# Patient Record
Sex: Male | Born: 1945
Health system: Southern US, Community
[De-identification: ages and names within clinical notes are randomized; demographics above are authoritative.]

## PROBLEM LIST (undated history)

## (undated) ENCOUNTER — Emergency Department (HOSPITAL_COMMUNITY): Payer: Medicare Other

## (undated) DIAGNOSIS — J189 Pneumonia, unspecified organism: Secondary | ICD-10-CM

## (undated) DIAGNOSIS — N51 Disorders of male genital organs in diseases classified elsewhere: Secondary | ICD-10-CM

## (undated) DIAGNOSIS — C3490 Malignant neoplasm of unspecified part of unspecified bronchus or lung: Secondary | ICD-10-CM

## (undated) DIAGNOSIS — A630 Anogenital (venereal) warts: Secondary | ICD-10-CM

## (undated) DIAGNOSIS — N509 Disorder of male genital organs, unspecified: Secondary | ICD-10-CM

## (undated) DIAGNOSIS — Z923 Personal history of irradiation: Secondary | ICD-10-CM

## (undated) MED FILL — Fosaprepitant Dimeglumine For IV Infusion 150 MG (Base Eq): INTRAVENOUS | Qty: 5 | Status: AC

---

## 2007-04-08 HISTORY — PX: HERNIA REPAIR: SHX51

## 2011-07-16 DIAGNOSIS — S61209A Unspecified open wound of unspecified finger without damage to nail, initial encounter: Secondary | ICD-10-CM | POA: Diagnosis not present

## 2011-07-16 DIAGNOSIS — Z23 Encounter for immunization: Secondary | ICD-10-CM | POA: Diagnosis not present

## 2011-09-17 DIAGNOSIS — F17200 Nicotine dependence, unspecified, uncomplicated: Secondary | ICD-10-CM | POA: Diagnosis not present

## 2011-09-17 DIAGNOSIS — R7309 Other abnormal glucose: Secondary | ICD-10-CM | POA: Diagnosis not present

## 2011-09-17 DIAGNOSIS — E782 Mixed hyperlipidemia: Secondary | ICD-10-CM | POA: Diagnosis not present

## 2011-09-17 DIAGNOSIS — N400 Enlarged prostate without lower urinary tract symptoms: Secondary | ICD-10-CM | POA: Diagnosis not present

## 2011-09-24 DIAGNOSIS — F17200 Nicotine dependence, unspecified, uncomplicated: Secondary | ICD-10-CM | POA: Diagnosis not present

## 2011-09-24 DIAGNOSIS — B079 Viral wart, unspecified: Secondary | ICD-10-CM | POA: Diagnosis not present

## 2011-09-24 DIAGNOSIS — R7309 Other abnormal glucose: Secondary | ICD-10-CM | POA: Diagnosis not present

## 2011-09-24 DIAGNOSIS — E782 Mixed hyperlipidemia: Secondary | ICD-10-CM | POA: Diagnosis not present

## 2011-10-30 DIAGNOSIS — A630 Anogenital (venereal) warts: Secondary | ICD-10-CM | POA: Diagnosis not present

## 2011-10-31 ENCOUNTER — Other Ambulatory Visit: Payer: Self-pay | Admitting: Urology

## 2011-11-11 ENCOUNTER — Encounter (HOSPITAL_BASED_OUTPATIENT_CLINIC_OR_DEPARTMENT_OTHER): Payer: Self-pay | Admitting: *Deleted

## 2011-11-11 NOTE — Progress Notes (Signed)
NPO AFTER MN. ARRIVES AT 0730. NEEDS HG AND EKG.

## 2011-11-18 ENCOUNTER — Ambulatory Visit (HOSPITAL_BASED_OUTPATIENT_CLINIC_OR_DEPARTMENT_OTHER): Admission: RE | Admit: 2011-11-18 | Payer: Medicare Other | Source: Ambulatory Visit | Admitting: Urology

## 2011-11-18 ENCOUNTER — Encounter (HOSPITAL_BASED_OUTPATIENT_CLINIC_OR_DEPARTMENT_OTHER): Admission: RE | Payer: Self-pay | Source: Ambulatory Visit

## 2011-11-18 HISTORY — DX: Anogenital (venereal) warts: A63.0

## 2011-11-18 HISTORY — DX: Disorder of male genital organs, unspecified: N50.9

## 2011-11-18 HISTORY — DX: Disorders of male genital organs in diseases classified elsewhere: N51

## 2011-11-18 SURGERY — REMOVAL, CONDYLOMA
Anesthesia: General

## 2011-11-20 ENCOUNTER — Encounter (HOSPITAL_COMMUNITY): Payer: Self-pay

## 2011-11-20 ENCOUNTER — Encounter (HOSPITAL_COMMUNITY)
Admission: RE | Admit: 2011-11-20 | Discharge: 2011-11-20 | Disposition: A | Discharge status: Home / Self Care | Payer: Medicare Other | Source: Ambulatory Visit | Attending: Urology | Admitting: Urology

## 2011-11-20 DIAGNOSIS — A630 Anogenital (venereal) warts: Secondary | ICD-10-CM | POA: Diagnosis not present

## 2011-11-20 DIAGNOSIS — Z01818 Encounter for other preprocedural examination: Secondary | ICD-10-CM | POA: Diagnosis not present

## 2011-11-20 LAB — CBC
HCT: 48.7 % (ref 39.0–52.0)
Hemoglobin: 17 g/dL (ref 13.0–17.0)
MCH: 32.1 pg (ref 26.0–34.0)
MCHC: 34.9 g/dL (ref 30.0–36.0)
MCV: 92.1 fL (ref 78.0–100.0)
RBC: 5.29 MIL/uL (ref 4.22–5.81)

## 2011-11-20 LAB — SURGICAL PCR SCREEN: MRSA, PCR: NEGATIVE

## 2011-11-20 NOTE — Pre-Procedure Instructions (Signed)
Reviewed with patient pre-op instructions using Teach back method. 

## 2011-11-20 NOTE — Patient Instructions (Signed)
20 Edward Beck  11/20/2011   Your procedure is scheduled on:  Friday 11/21/2011 at 1130 am   Report to [facility] at 0900 AM.  Call this number if you have problems the morning of surgery: [phone]   Remember:   Do not eat food:After Midnight.  May have clear liquids:until Midnight .    Take these medicines the morning of surgery with A SIP OF WATER: none   Do not wear jewelry  Do not wear lotions, powders, colognes  Do not shave 48 hours prior to surgery. Men may shave face and neck.  Do not bring valuables to the hospital.  Contacts, dentures or bridgework may not be worn into surgery.  Leave suitcase in the car. After surgery it may be brought to your room.  For patients admitted to the hospital, checkout time is 11:00 AM the day of discharge.   Patients discharged the day of surgery will not be allowed to drive home.  Name and phone number of your driver: [name]  [phone]  Special Instructions: CHG Shower Use Special Wash: 1/2 bottle night before surgery and 1/2 bottle morning of surgery.   Please read over the following fact sheets that you were given: MRSA Information, Sleep apnea sheet, Incentive spirometry sheet

## 2011-11-21 ENCOUNTER — Encounter (HOSPITAL_COMMUNITY): Payer: Self-pay | Admitting: Certified Registered Nurse Anesthetist

## 2011-11-21 ENCOUNTER — Ambulatory Visit (HOSPITAL_COMMUNITY)
Admission: RE | Admit: 2011-11-21 | Discharge: 2011-11-21 | Disposition: A | Discharge status: Home / Self Care | Payer: Medicare Other | Source: Ambulatory Visit | Attending: Urology | Admitting: Urology

## 2011-11-21 ENCOUNTER — Encounter (HOSPITAL_COMMUNITY)
Admission: RE | Disposition: A | Discharge status: Home / Self Care | Payer: Self-pay | Source: Ambulatory Visit | Attending: Urology

## 2011-11-21 ENCOUNTER — Encounter (HOSPITAL_COMMUNITY): Payer: Self-pay | Admitting: *Deleted

## 2011-11-21 ENCOUNTER — Ambulatory Visit (HOSPITAL_COMMUNITY): Payer: Medicare Other | Admitting: Certified Registered Nurse Anesthetist

## 2011-11-21 DIAGNOSIS — Z01818 Encounter for other preprocedural examination: Secondary | ICD-10-CM | POA: Diagnosis not present

## 2011-11-21 DIAGNOSIS — A630 Anogenital (venereal) warts: Secondary | ICD-10-CM | POA: Diagnosis not present

## 2011-11-21 DIAGNOSIS — N509 Disorder of male genital organs, unspecified: Secondary | ICD-10-CM | POA: Diagnosis not present

## 2011-11-21 HISTORY — PX: SCROTAL EXPLORATION: SHX2386

## 2011-11-21 SURGERY — EXPLORATION, SCROTUM
Anesthesia: General | Wound class: Clean

## 2011-11-21 MED ORDER — ACETAMINOPHEN 10 MG/ML IV SOLN
INTRAVENOUS | Status: AC
  Filled 2011-11-21: qty 100

## 2011-11-21 MED ORDER — BUPIVACAINE HCL (PF) 0.25 % IJ SOLN
INTRAMUSCULAR | Status: DC | PRN
  Administered 2011-11-21: 10 mL

## 2011-11-21 MED ORDER — LACTATED RINGERS IV SOLN
INTRAVENOUS | Status: DC
  Administered 2011-11-21: 1000 mL via INTRAVENOUS

## 2011-11-21 MED ORDER — PROPOFOL 10 MG/ML IV EMUL
INTRAVENOUS | Status: DC | PRN
  Administered 2011-11-21: 200 mg via INTRAVENOUS

## 2011-11-21 MED ORDER — BACITRACIN-NEOMYCIN-POLYMYXIN OINTMENT TUBE
TOPICAL_OINTMENT | CUTANEOUS | Status: DC | PRN
  Administered 2011-11-21: 1 via TOPICAL

## 2011-11-21 MED ORDER — HYDROCODONE-ACETAMINOPHEN 5-500 MG PO TABS: 1.0000 | ORAL_TABLET | Freq: Four times a day (QID) | ORAL | Status: AC | PRN

## 2011-11-21 MED ORDER — CEFAZOLIN SODIUM-DEXTROSE 2-3 GM-% IV SOLR
2.0000 g | INTRAVENOUS | Status: AC
  Administered 2011-11-21: 2 g via INTRAVENOUS

## 2011-11-21 MED ORDER — PROMETHAZINE HCL 25 MG/ML IJ SOLN: 6.2500 mg | INTRAMUSCULAR | Status: DC | PRN

## 2011-11-21 MED ORDER — EPHEDRINE SULFATE 50 MG/ML IJ SOLN
INTRAMUSCULAR | Status: DC | PRN
  Administered 2011-11-21: 5 mg via INTRAVENOUS
  Administered 2011-11-21 (×2): 10 mg via INTRAVENOUS

## 2011-11-21 MED ORDER — 0.9 % SODIUM CHLORIDE (POUR BTL) OPTIME
TOPICAL | Status: DC | PRN
  Administered 2011-11-21: 1000 mL

## 2011-11-21 MED ORDER — ACETAMINOPHEN 10 MG/ML IV SOLN
INTRAVENOUS | Status: DC | PRN
  Administered 2011-11-21: 1000 mg via INTRAVENOUS

## 2011-11-21 MED ORDER — LACTATED RINGERS IV SOLN
INTRAVENOUS | Status: DC | PRN
  Administered 2011-11-21: 13:00:00 via INTRAVENOUS

## 2011-11-21 MED ORDER — ONDANSETRON HCL 4 MG/2ML IJ SOLN
INTRAMUSCULAR | Status: DC | PRN
  Administered 2011-11-21: 4 mg via INTRAVENOUS

## 2011-11-21 MED ORDER — FENTANYL CITRATE 0.05 MG/ML IJ SOLN
INTRAMUSCULAR | Status: DC | PRN
  Administered 2011-11-21: 100 ug via INTRAVENOUS

## 2011-11-21 MED ORDER — FENTANYL CITRATE 0.05 MG/ML IJ SOLN: 25.0000 ug | INTRAMUSCULAR | Status: DC | PRN

## 2011-11-21 MED ORDER — CEPHALEXIN 250 MG PO CAPS: 250.0000 mg | ORAL_CAPSULE | Freq: Four times a day (QID) | ORAL | Status: AC

## 2011-11-21 MED ORDER — BUPIVACAINE HCL (PF) 0.25 % IJ SOLN
INTRAMUSCULAR | Status: AC
  Filled 2011-11-21: qty 30

## 2011-11-21 MED ORDER — LIDOCAINE HCL (CARDIAC) 20 MG/ML IV SOLN
INTRAVENOUS | Status: DC | PRN
  Administered 2011-11-21: 20 mg via INTRAVENOUS

## 2011-11-21 MED ORDER — KETOROLAC TROMETHAMINE 30 MG/ML IJ SOLN: 15.0000 mg | Freq: Once | INTRAMUSCULAR | Status: DC | PRN

## 2011-11-21 MED ORDER — CEFAZOLIN SODIUM-DEXTROSE 2-3 GM-% IV SOLR
INTRAVENOUS | Status: AC
  Filled 2011-11-21: qty 50

## 2011-11-21 SURGICAL SUPPLY — 24 items
BLADE HEX COATED 2.75 (ELECTRODE) ×2 IMPLANT
CLOTH BEACON ORANGE TIMEOUT ST (SAFETY) ×2 IMPLANT
COVER SURGICAL LIGHT HANDLE (MISCELLANEOUS) ×2 IMPLANT
DECANTER SPIKE VIAL GLASS SM (MISCELLANEOUS) IMPLANT
DRAIN PENROSE 18X1/4 LTX STRL (WOUND CARE) IMPLANT
DRSG TEGADERM 4X4.75 (GAUZE/BANDAGES/DRESSINGS) ×2 IMPLANT
ELECT REM PT RETURN 9FT ADLT (ELECTROSURGICAL) ×2
ELECTRODE REM PT RTRN 9FT ADLT (ELECTROSURGICAL) ×1 IMPLANT
GLOVE BIOGEL M 7.0 STRL (GLOVE) ×2 IMPLANT
GOWN STRL NON-REIN LRG LVL3 (GOWN DISPOSABLE) ×4 IMPLANT
KIT BASIN OR (CUSTOM PROCEDURE TRAY) ×2 IMPLANT
NEEDLE HYPO 22GX1.5 SAFETY (NEEDLE) ×2 IMPLANT
NS IRRIG 1000ML POUR BTL (IV SOLUTION) ×2 IMPLANT
PACK GENERAL/GYN (CUSTOM PROCEDURE TRAY) ×2 IMPLANT
SPONGE GAUZE 4X4 12PLY (GAUZE/BANDAGES/DRESSINGS) ×2 IMPLANT
SUPPORT SCROTAL LG STRP (MISCELLANEOUS) ×2 IMPLANT
SUT CHROMIC 3 0 PS 2 (SUTURE) IMPLANT
SUT CHROMIC 3 0 SH 27 (SUTURE) IMPLANT
SUT MNCRL AB 4-0 PS2 18 (SUTURE) ×2 IMPLANT
SUT VIC AB 0 UR5 27 (SUTURE) IMPLANT
SUT VIC AB 3-0 SH 27 (SUTURE) ×1
SUT VIC AB 3-0 SH 27XBRD (SUTURE) ×1 IMPLANT
SYR CONTROL 10ML LL (SYRINGE) ×2 IMPLANT
WATER STERILE IRR 1500ML POUR (IV SOLUTION) IMPLANT

## 2011-11-21 NOTE — Op Note (Signed)
  General  Pre-op diagnosis: Large scrotal condyloma  Postop diagnosis: Same  Procedure done: Excision biopsy of large scrotal condyloma  Surgeon: Wendie Simmer. Graciella Arment  Anesthesia: General  Indication: Patient is a 66 years old male who has had a growth on the scrotum for several years. It has been recently increasing in size. And it is now causing him some discomfort. He would like to have it excised. He is scheduled today for the procedure.  Procedure: The patient was identified by his wrist band and proper timeout was taken.  Under general anesthesia patient was prepped and draped and placed in the supine position. The  right scrotum in the area of the condyloma was infiltrated with 0.25% Marcaine. Then an elliptical incision was made around the edges of the condyloma. The incision was carried down to the subcutaneous tissues. Then the condyloma was sharply excised with the Metzenbaum scissors. Hemostasis was secured with electrocautery. Then the wound was closed in 2 layers as follows: Subcutaneous tissue were approximated with #3-0 Vicryl and the skin was closed with #4-0 Monocryl.  EBL: Minimal  Needles, sponges count:  Correct on 2 counts.  The patient tolerated the procedure well and left the OR in satisfactory condition to postanesthesia care unit.

## 2011-11-21 NOTE — Anesthesia Postprocedure Evaluation (Signed)
  Anesthesia Post-op Note  Patient: Edward Beck  Procedure(s) Performed: Procedure(s) (LRB): SCROTUM EXPLORATION (N/A)  Patient Location: PACU  Anesthesia Type: General  Level of Consciousness: awake and alert   Airway and Oxygen Therapy: Patient Spontanous Breathing  Post-op Pain: mild  Post-op Assessment: Post-op Vital signs reviewed, Patient's Cardiovascular Status Stable, Respiratory Function Stable, Patent Airway and No signs of Nausea or vomiting  Post-op Vital Signs: stable  Complications: No apparent anesthesia complications

## 2011-11-21 NOTE — Transfer of Care (Signed)
Immediate Anesthesia Transfer of Care Note  Patient: Edward Beck  Procedure(s) Performed: Procedure(s) (LRB): SCROTUM EXPLORATION (N/A)  Patient Location: PACU  Anesthesia Type: General  Level of Consciousness: awake and alert   Airway & Oxygen Therapy: Patient Spontanous Breathing and Patient connected to face mask  Post-op Assessment: Report given to PACU RN  Post vital signs: Reviewed and stable  Complications: No apparent anesthesia complications

## 2011-11-21 NOTE — Anesthesia Preprocedure Evaluation (Signed)
Anesthesia Evaluation  Patient identified by MRN, date of birth, ID band Patient awake    Reviewed: Allergy & Precautions, H&P , NPO status , Patient's Chart, lab work & pertinent test results  Airway Mallampati: II TM Distance: <3 FB Neck ROM: Full    Dental No notable dental hx.    Pulmonary COPDCurrent Smoker,  breath sounds clear to auscultation  Pulmonary exam normal       Cardiovascular negative cardio ROS  Rhythm:Regular Rate:Normal     Neuro/Psych negative neurological ROS  negative psych ROS   GI/Hepatic negative GI ROS, Neg liver ROS,   Endo/Other  negative endocrine ROS  Renal/GU negative Renal ROS  negative genitourinary   Musculoskeletal negative musculoskeletal ROS (+)   Abdominal   Peds negative pediatric ROS (+)  Hematology negative hematology ROS (+)   Anesthesia Other Findings   Reproductive/Obstetrics negative OB ROS                           Anesthesia Physical Anesthesia Plan  ASA: II  Anesthesia Plan: General   Post-op Pain Management:    Induction: Intravenous  Airway Management Planned: LMA  Additional Equipment:   Intra-op Plan:   Post-operative Plan:   Informed Consent: I have reviewed the patients History and Physical, chart, labs and discussed the procedure including the risks, benefits and alternatives for the proposed anesthesia with the patient or authorized representative who has indicated his/her understanding and acceptance.   Dental advisory given  Plan Discussed with: CRNA and Surgeon  Anesthesia Plan Comments:         Anesthesia Quick Evaluation

## 2011-11-21 NOTE — H&P (Signed)
History of Present Illness     [name] is a referral from Dr Selinda Flavin.  [name] has had a growth on the right side of his scrotum for several years.  For the past year it has increased in size and now measures 3 x 4 cm.  It causes him some discomfort.  He has been using Neosporin ointment on it but he has not noticed any changes.  He denies any voiding symptoms.  He is in good health.  His PSA is 1.8.   Surgical History Problems  1. History of  Inguinal Hernia Repair Left  Current Meds 1. No Reported Medications  Allergies Medication  1. No Known Drug Allergies  Family History Problems  1. Family history of  Family Health Status Number Of Children 2. Maternal history of  Father Deceased At Age ____ 3. Family history of  Heart Disease V17.49 4. Family history of  Laryngeal Cancer V16.2 5. Maternal history of  Mother Deceased At Age ____  Social History Problems    Alcohol Use   Caffeine Use   Marital History - Currently Married   Retired From Work   Tobacco Use 305.1  Review of Systems Genitourinary, constitutional, skin, eye, otolaryngeal, hematologic/lymphatic, cardiovascular, pulmonary, endocrine, musculoskeletal, gastrointestinal, neurological and psychiatric system(s) were reviewed and pertinent findings if present are noted.    Vitals Vital Signs [Data Includes: Last 1 Day]  25Jul2013 09:48AM  BMI Calculated: 25.67 BSA Calculated: 2.17 Height: 6 ft 2 in Weight: 200 lb  Blood Pressure: 142 / 97 Heart Rate: 80 Respiration: 18  Physical Exam Constitutional: Well nourished and well developed . No acute distress.  ENT:. The ears and nose are normal in appearance.  Neck: The appearance of the neck is normal and no neck mass is present.  Pulmonary: No respiratory distress and normal respiratory rhythm and effort.  Cardiovascular: Heart rate and rhythm are normal . No peripheral edema.  Abdomen: The abdomen is soft and nontender. No masses are palpated.  No CVA tenderness. No hernias are palpable. No hepatosplenomegaly noted.  Rectal: Rectal exam demonstrates normal sphincter tone, no tenderness and no masses. Estimated prostate size is 2+. The prostate has no nodularity, is not indurated and is not tender. The left seminal vesicle is nonpalpable. The right seminal vesicle is nonpalpable. The perineum is normal on inspection.  Genitourinary: Examination of the penis demonstrates no discharge, no masses, no lesions and a normal meatus. The scrotum is with lesions.  There is a 3x 4 cm papular lesion on the right upper scrotum.  It is not fixed to the underlying scrotal tissues. The right epididymis is palpably normal and non-tender. The left epididymis is palpably normal and non-tender. The right spermatic cord is palpably normal. The left spermatic cord is palpably normal. The right testis is palpably normal, non-tender and without masses. The left testis is normal, non-tender and without masses.  Lymphatics: The femoral and inguinal nodes are not enlarged or tender.  Skin: Normal skin turgor, no visible rash and no visible skin lesions.  Neuro/Psych:. Mood and affect are appropriate.    Results/Data Urine [Data Includes: Last 1 Day]   25Jul2013  COLOR YELLOW   APPEARANCE CLEAR   SPECIFIC GRAVITY <1.005   pH 6.0   GLUCOSE NEG mg/dL  BILIRUBIN NEG   KETONE NEG mg/dL  BLOOD NEG   PROTEIN NEG mg/dL  UROBILINOGEN 0.2 mg/dL  NITRITE NEG   LEUKOCYTE ESTERASE TRACE   SQUAMOUS EPITHELIAL/HPF NONE SEEN   WBC  0-2 WBC/hpf  RBC NONE SEEN RBC/hpf  BACTERIA NONE SEEN   CRYSTALS NONE SEEN   CASTS NONE SEEN    Assessment Assessed  1. Condyloma Acuminatum 078.11  Plan Condyloma Acuminatum (078.11)  1. Follow-up Schedule Surgery Office  Follow-up  Done: 25Jul2013 Health Maintenance (V70.0)  2. UA With REFLEX  Done: 25Jul2013 09:40AM   Excision biopsy of scrotal lesion.  Because of the size of the lesion I think it is preferable to do it under  anesthesia.  The risks, benefits of the procedure were explained to the patient.  The risks include but are not limited to hemorrhage, infection, wound dehiscence, hematoma.  He understands and wishes to proceed.   Signatures  CC: Dr Selinda Flavin  Electronically signed by : Su Grand, M.D.; Oct 30 2011 10:17AM

## 2011-11-24 ENCOUNTER — Encounter (HOSPITAL_COMMUNITY): Payer: Self-pay | Admitting: Urology

## 2011-12-02 DIAGNOSIS — A630 Anogenital (venereal) warts: Secondary | ICD-10-CM | POA: Diagnosis not present

## 2012-01-08 DIAGNOSIS — A630 Anogenital (venereal) warts: Secondary | ICD-10-CM | POA: Diagnosis not present

## 2013-07-15 DIAGNOSIS — E782 Mixed hyperlipidemia: Secondary | ICD-10-CM | POA: Diagnosis not present

## 2013-07-15 DIAGNOSIS — R7309 Other abnormal glucose: Secondary | ICD-10-CM | POA: Diagnosis not present

## 2013-07-15 DIAGNOSIS — F17200 Nicotine dependence, unspecified, uncomplicated: Secondary | ICD-10-CM | POA: Diagnosis not present

## 2014-01-31 DIAGNOSIS — L570 Actinic keratosis: Secondary | ICD-10-CM | POA: Diagnosis not present

## 2014-01-31 DIAGNOSIS — D485 Neoplasm of uncertain behavior of skin: Secondary | ICD-10-CM | POA: Diagnosis not present

## 2014-01-31 DIAGNOSIS — L821 Other seborrheic keratosis: Secondary | ICD-10-CM | POA: Diagnosis not present

## 2014-02-15 DIAGNOSIS — L570 Actinic keratosis: Secondary | ICD-10-CM | POA: Diagnosis not present

## 2014-02-15 DIAGNOSIS — D485 Neoplasm of uncertain behavior of skin: Secondary | ICD-10-CM | POA: Diagnosis not present

## 2014-02-15 DIAGNOSIS — D0462 Carcinoma in situ of skin of left upper limb, including shoulder: Secondary | ICD-10-CM | POA: Diagnosis not present

## 2014-02-15 DIAGNOSIS — Z85828 Personal history of other malignant neoplasm of skin: Secondary | ICD-10-CM | POA: Diagnosis not present

## 2014-02-15 DIAGNOSIS — L281 Prurigo nodularis: Secondary | ICD-10-CM | POA: Diagnosis not present

## 2014-02-23 DIAGNOSIS — C44320 Squamous cell carcinoma of skin of unspecified parts of face: Secondary | ICD-10-CM | POA: Diagnosis not present

## 2014-02-23 DIAGNOSIS — L570 Actinic keratosis: Secondary | ICD-10-CM | POA: Diagnosis not present

## 2014-03-09 DIAGNOSIS — L570 Actinic keratosis: Secondary | ICD-10-CM | POA: Diagnosis not present

## 2014-03-09 DIAGNOSIS — C44621 Squamous cell carcinoma of skin of unspecified upper limb, including shoulder: Secondary | ICD-10-CM | POA: Diagnosis not present

## 2014-08-01 DIAGNOSIS — L570 Actinic keratosis: Secondary | ICD-10-CM | POA: Diagnosis not present

## 2014-08-01 DIAGNOSIS — L821 Other seborrheic keratosis: Secondary | ICD-10-CM | POA: Diagnosis not present

## 2014-08-01 DIAGNOSIS — Z85828 Personal history of other malignant neoplasm of skin: Secondary | ICD-10-CM | POA: Diagnosis not present

## 2014-08-01 DIAGNOSIS — L859 Epidermal thickening, unspecified: Secondary | ICD-10-CM | POA: Diagnosis not present

## 2014-08-01 DIAGNOSIS — D485 Neoplasm of uncertain behavior of skin: Secondary | ICD-10-CM | POA: Diagnosis not present

## 2015-01-30 DIAGNOSIS — L570 Actinic keratosis: Secondary | ICD-10-CM | POA: Diagnosis not present

## 2015-05-02 DIAGNOSIS — J019 Acute sinusitis, unspecified: Secondary | ICD-10-CM | POA: Diagnosis not present

## 2015-06-08 DIAGNOSIS — Z720 Tobacco use: Secondary | ICD-10-CM | POA: Diagnosis not present

## 2015-06-08 DIAGNOSIS — R7301 Impaired fasting glucose: Secondary | ICD-10-CM | POA: Diagnosis not present

## 2015-06-08 DIAGNOSIS — E782 Mixed hyperlipidemia: Secondary | ICD-10-CM | POA: Diagnosis not present

## 2015-06-12 DIAGNOSIS — E782 Mixed hyperlipidemia: Secondary | ICD-10-CM | POA: Diagnosis not present

## 2015-06-12 DIAGNOSIS — R7301 Impaired fasting glucose: Secondary | ICD-10-CM | POA: Diagnosis not present

## 2015-06-12 DIAGNOSIS — Z720 Tobacco use: Secondary | ICD-10-CM | POA: Diagnosis not present

## 2015-07-24 DIAGNOSIS — L280 Lichen simplex chronicus: Secondary | ICD-10-CM | POA: Diagnosis not present

## 2015-07-24 DIAGNOSIS — Z85828 Personal history of other malignant neoplasm of skin: Secondary | ICD-10-CM | POA: Diagnosis not present

## 2015-07-24 DIAGNOSIS — L570 Actinic keratosis: Secondary | ICD-10-CM | POA: Diagnosis not present

## 2015-10-02 DIAGNOSIS — J019 Acute sinusitis, unspecified: Secondary | ICD-10-CM | POA: Diagnosis not present

## 2015-10-02 DIAGNOSIS — J209 Acute bronchitis, unspecified: Secondary | ICD-10-CM | POA: Diagnosis not present

## 2015-11-24 DIAGNOSIS — N390 Urinary tract infection, site not specified: Secondary | ICD-10-CM | POA: Diagnosis not present

## 2015-11-24 DIAGNOSIS — K5900 Constipation, unspecified: Secondary | ICD-10-CM | POA: Diagnosis not present

## 2015-11-24 DIAGNOSIS — R109 Unspecified abdominal pain: Secondary | ICD-10-CM | POA: Diagnosis not present

## 2015-12-11 DIAGNOSIS — E782 Mixed hyperlipidemia: Secondary | ICD-10-CM | POA: Diagnosis not present

## 2015-12-11 DIAGNOSIS — Z720 Tobacco use: Secondary | ICD-10-CM | POA: Diagnosis not present

## 2015-12-11 DIAGNOSIS — R7301 Impaired fasting glucose: Secondary | ICD-10-CM | POA: Diagnosis not present

## 2015-12-14 DIAGNOSIS — Z6826 Body mass index (BMI) 26.0-26.9, adult: Secondary | ICD-10-CM | POA: Diagnosis not present

## 2015-12-14 DIAGNOSIS — E782 Mixed hyperlipidemia: Secondary | ICD-10-CM | POA: Diagnosis not present

## 2015-12-14 DIAGNOSIS — Z720 Tobacco use: Secondary | ICD-10-CM | POA: Diagnosis not present

## 2015-12-14 DIAGNOSIS — R7301 Impaired fasting glucose: Secondary | ICD-10-CM | POA: Diagnosis not present

## 2016-02-05 DIAGNOSIS — L570 Actinic keratosis: Secondary | ICD-10-CM | POA: Diagnosis not present

## 2016-02-05 DIAGNOSIS — L821 Other seborrheic keratosis: Secondary | ICD-10-CM | POA: Diagnosis not present

## 2016-02-05 DIAGNOSIS — Z85828 Personal history of other malignant neoplasm of skin: Secondary | ICD-10-CM | POA: Diagnosis not present

## 2016-03-04 DIAGNOSIS — Z6826 Body mass index (BMI) 26.0-26.9, adult: Secondary | ICD-10-CM | POA: Diagnosis not present

## 2016-03-04 DIAGNOSIS — Z23 Encounter for immunization: Secondary | ICD-10-CM | POA: Diagnosis not present

## 2016-03-04 DIAGNOSIS — J00 Acute nasopharyngitis [common cold]: Secondary | ICD-10-CM | POA: Diagnosis not present

## 2016-04-02 DIAGNOSIS — Z6825 Body mass index (BMI) 25.0-25.9, adult: Secondary | ICD-10-CM | POA: Diagnosis not present

## 2016-04-02 DIAGNOSIS — J019 Acute sinusitis, unspecified: Secondary | ICD-10-CM | POA: Diagnosis not present

## 2016-04-02 DIAGNOSIS — J209 Acute bronchitis, unspecified: Secondary | ICD-10-CM | POA: Diagnosis not present

## 2016-06-10 DIAGNOSIS — R7301 Impaired fasting glucose: Secondary | ICD-10-CM | POA: Diagnosis not present

## 2016-06-10 DIAGNOSIS — E782 Mixed hyperlipidemia: Secondary | ICD-10-CM | POA: Diagnosis not present

## 2016-06-13 DIAGNOSIS — E782 Mixed hyperlipidemia: Secondary | ICD-10-CM | POA: Diagnosis not present

## 2016-06-13 DIAGNOSIS — Z1389 Encounter for screening for other disorder: Secondary | ICD-10-CM | POA: Diagnosis not present

## 2016-06-13 DIAGNOSIS — R030 Elevated blood-pressure reading, without diagnosis of hypertension: Secondary | ICD-10-CM | POA: Diagnosis not present

## 2016-06-13 DIAGNOSIS — Z720 Tobacco use: Secondary | ICD-10-CM | POA: Diagnosis not present

## 2016-06-13 DIAGNOSIS — Z6825 Body mass index (BMI) 25.0-25.9, adult: Secondary | ICD-10-CM | POA: Diagnosis not present

## 2016-07-30 DIAGNOSIS — L570 Actinic keratosis: Secondary | ICD-10-CM | POA: Diagnosis not present

## 2016-12-09 DIAGNOSIS — Z720 Tobacco use: Secondary | ICD-10-CM | POA: Diagnosis not present

## 2016-12-09 DIAGNOSIS — R7301 Impaired fasting glucose: Secondary | ICD-10-CM | POA: Diagnosis not present

## 2016-12-09 DIAGNOSIS — E782 Mixed hyperlipidemia: Secondary | ICD-10-CM | POA: Diagnosis not present

## 2016-12-09 DIAGNOSIS — R030 Elevated blood-pressure reading, without diagnosis of hypertension: Secondary | ICD-10-CM | POA: Diagnosis not present

## 2016-12-12 DIAGNOSIS — R300 Dysuria: Secondary | ICD-10-CM | POA: Diagnosis not present

## 2016-12-12 DIAGNOSIS — N410 Acute prostatitis: Secondary | ICD-10-CM | POA: Diagnosis not present

## 2016-12-12 DIAGNOSIS — E782 Mixed hyperlipidemia: Secondary | ICD-10-CM | POA: Diagnosis not present

## 2016-12-12 DIAGNOSIS — Z720 Tobacco use: Secondary | ICD-10-CM | POA: Diagnosis not present

## 2016-12-12 DIAGNOSIS — Z6826 Body mass index (BMI) 26.0-26.9, adult: Secondary | ICD-10-CM | POA: Diagnosis not present

## 2017-01-24 DIAGNOSIS — J209 Acute bronchitis, unspecified: Secondary | ICD-10-CM | POA: Diagnosis not present

## 2017-01-27 DIAGNOSIS — Z85828 Personal history of other malignant neoplasm of skin: Secondary | ICD-10-CM | POA: Diagnosis not present

## 2017-01-27 DIAGNOSIS — L570 Actinic keratosis: Secondary | ICD-10-CM | POA: Diagnosis not present

## 2017-01-27 DIAGNOSIS — D485 Neoplasm of uncertain behavior of skin: Secondary | ICD-10-CM | POA: Diagnosis not present

## 2017-02-04 DIAGNOSIS — Z6826 Body mass index (BMI) 26.0-26.9, adult: Secondary | ICD-10-CM | POA: Diagnosis not present

## 2017-02-04 DIAGNOSIS — J019 Acute sinusitis, unspecified: Secondary | ICD-10-CM | POA: Diagnosis not present

## 2017-03-04 DIAGNOSIS — J069 Acute upper respiratory infection, unspecified: Secondary | ICD-10-CM | POA: Diagnosis not present

## 2017-03-04 DIAGNOSIS — N410 Acute prostatitis: Secondary | ICD-10-CM | POA: Diagnosis not present

## 2017-03-04 DIAGNOSIS — Z6826 Body mass index (BMI) 26.0-26.9, adult: Secondary | ICD-10-CM | POA: Diagnosis not present

## 2017-03-17 DIAGNOSIS — Z6826 Body mass index (BMI) 26.0-26.9, adult: Secondary | ICD-10-CM | POA: Diagnosis not present

## 2017-03-17 DIAGNOSIS — N410 Acute prostatitis: Secondary | ICD-10-CM | POA: Diagnosis not present

## 2017-03-17 DIAGNOSIS — R319 Hematuria, unspecified: Secondary | ICD-10-CM | POA: Diagnosis not present

## 2017-05-13 ENCOUNTER — Ambulatory Visit: Payer: Medicare Other | Admitting: Urology

## 2017-05-13 DIAGNOSIS — R310 Gross hematuria: Secondary | ICD-10-CM | POA: Diagnosis not present

## 2017-05-14 ENCOUNTER — Other Ambulatory Visit: Payer: Self-pay | Admitting: Urology

## 2017-05-15 ENCOUNTER — Other Ambulatory Visit: Payer: Self-pay | Admitting: Urology

## 2017-05-15 DIAGNOSIS — R310 Gross hematuria: Secondary | ICD-10-CM

## 2017-06-05 ENCOUNTER — Ambulatory Visit (HOSPITAL_COMMUNITY)
Admission: RE | Admit: 2017-06-05 | Discharge: 2017-06-05 | Disposition: A | Discharge status: Home / Self Care | Payer: Medicare Other | Source: Ambulatory Visit | Attending: Urology | Admitting: Urology

## 2017-06-05 DIAGNOSIS — I700 Atherosclerosis of aorta: Secondary | ICD-10-CM | POA: Insufficient documentation

## 2017-06-05 DIAGNOSIS — N200 Calculus of kidney: Secondary | ICD-10-CM | POA: Diagnosis not present

## 2017-06-05 DIAGNOSIS — N400 Enlarged prostate without lower urinary tract symptoms: Secondary | ICD-10-CM | POA: Insufficient documentation

## 2017-06-05 DIAGNOSIS — I517 Cardiomegaly: Secondary | ICD-10-CM | POA: Insufficient documentation

## 2017-06-05 DIAGNOSIS — R911 Solitary pulmonary nodule: Secondary | ICD-10-CM | POA: Insufficient documentation

## 2017-06-05 DIAGNOSIS — K5732 Diverticulitis of large intestine without perforation or abscess without bleeding: Secondary | ICD-10-CM | POA: Diagnosis not present

## 2017-06-05 DIAGNOSIS — R310 Gross hematuria: Secondary | ICD-10-CM | POA: Insufficient documentation

## 2017-06-05 LAB — POCT I-STAT CREATININE: Creatinine, Ser: 1.1 mg/dL (ref 0.61–1.24)

## 2017-06-05 MED ORDER — IOPAMIDOL (ISOVUE-300) INJECTION 61%
150.0000 mL | Freq: Once | INTRAVENOUS | Status: AC | PRN
  Administered 2017-06-05: 150 mL via INTRAVENOUS

## 2017-06-05 MED ORDER — IOPAMIDOL (ISOVUE-300) INJECTION 61%: 125.0000 mL | Freq: Once | INTRAVENOUS | Status: DC | PRN

## 2017-06-24 ENCOUNTER — Ambulatory Visit (INDEPENDENT_AMBULATORY_CARE_PROVIDER_SITE_OTHER): Payer: Medicare Other | Admitting: Urology

## 2017-06-24 DIAGNOSIS — R310 Gross hematuria: Secondary | ICD-10-CM

## 2017-08-04 DIAGNOSIS — D485 Neoplasm of uncertain behavior of skin: Secondary | ICD-10-CM | POA: Diagnosis not present

## 2017-08-04 DIAGNOSIS — Z85828 Personal history of other malignant neoplasm of skin: Secondary | ICD-10-CM | POA: Diagnosis not present

## 2017-08-04 DIAGNOSIS — L820 Inflamed seborrheic keratosis: Secondary | ICD-10-CM | POA: Diagnosis not present

## 2017-08-04 DIAGNOSIS — L570 Actinic keratosis: Secondary | ICD-10-CM | POA: Diagnosis not present

## 2017-11-06 ENCOUNTER — Other Ambulatory Visit: Payer: Self-pay

## 2017-12-02 DIAGNOSIS — Z6826 Body mass index (BMI) 26.0-26.9, adult: Secondary | ICD-10-CM | POA: Diagnosis not present

## 2017-12-02 DIAGNOSIS — Z0000 Encounter for general adult medical examination without abnormal findings: Secondary | ICD-10-CM | POA: Diagnosis not present

## 2017-12-02 DIAGNOSIS — Z720 Tobacco use: Secondary | ICD-10-CM | POA: Diagnosis not present

## 2017-12-02 DIAGNOSIS — N401 Enlarged prostate with lower urinary tract symptoms: Secondary | ICD-10-CM | POA: Diagnosis not present

## 2017-12-02 DIAGNOSIS — E782 Mixed hyperlipidemia: Secondary | ICD-10-CM | POA: Diagnosis not present

## 2017-12-23 ENCOUNTER — Ambulatory Visit (INDEPENDENT_AMBULATORY_CARE_PROVIDER_SITE_OTHER): Payer: Medicare Other | Admitting: Urology

## 2017-12-23 DIAGNOSIS — N401 Enlarged prostate with lower urinary tract symptoms: Secondary | ICD-10-CM | POA: Diagnosis not present

## 2017-12-23 DIAGNOSIS — R310 Gross hematuria: Secondary | ICD-10-CM | POA: Diagnosis not present

## 2018-02-06 DIAGNOSIS — J019 Acute sinusitis, unspecified: Secondary | ICD-10-CM | POA: Diagnosis not present

## 2018-02-06 DIAGNOSIS — Z6826 Body mass index (BMI) 26.0-26.9, adult: Secondary | ICD-10-CM | POA: Diagnosis not present

## 2018-02-08 DIAGNOSIS — L309 Dermatitis, unspecified: Secondary | ICD-10-CM | POA: Diagnosis not present

## 2018-02-08 DIAGNOSIS — Z85828 Personal history of other malignant neoplasm of skin: Secondary | ICD-10-CM | POA: Diagnosis not present

## 2018-02-08 DIAGNOSIS — L821 Other seborrheic keratosis: Secondary | ICD-10-CM | POA: Diagnosis not present

## 2018-02-08 DIAGNOSIS — D0439 Carcinoma in situ of skin of other parts of face: Secondary | ICD-10-CM | POA: Diagnosis not present

## 2018-02-08 DIAGNOSIS — L570 Actinic keratosis: Secondary | ICD-10-CM | POA: Diagnosis not present

## 2018-02-08 DIAGNOSIS — D485 Neoplasm of uncertain behavior of skin: Secondary | ICD-10-CM | POA: Diagnosis not present

## 2018-02-08 DIAGNOSIS — L280 Lichen simplex chronicus: Secondary | ICD-10-CM | POA: Diagnosis not present

## 2018-02-25 DIAGNOSIS — C44321 Squamous cell carcinoma of skin of nose: Secondary | ICD-10-CM | POA: Diagnosis not present

## 2018-03-01 DIAGNOSIS — Z029 Encounter for administrative examinations, unspecified: Secondary | ICD-10-CM | POA: Diagnosis not present

## 2018-06-14 DIAGNOSIS — J019 Acute sinusitis, unspecified: Secondary | ICD-10-CM | POA: Diagnosis not present

## 2018-06-14 DIAGNOSIS — Z6825 Body mass index (BMI) 25.0-25.9, adult: Secondary | ICD-10-CM | POA: Diagnosis not present

## 2018-08-09 DIAGNOSIS — C44622 Squamous cell carcinoma of skin of right upper limb, including shoulder: Secondary | ICD-10-CM | POA: Diagnosis not present

## 2018-08-09 DIAGNOSIS — D485 Neoplasm of uncertain behavior of skin: Secondary | ICD-10-CM | POA: Diagnosis not present

## 2018-08-09 DIAGNOSIS — Z85828 Personal history of other malignant neoplasm of skin: Secondary | ICD-10-CM | POA: Diagnosis not present

## 2018-08-09 DIAGNOSIS — L570 Actinic keratosis: Secondary | ICD-10-CM | POA: Diagnosis not present

## 2018-08-09 DIAGNOSIS — L309 Dermatitis, unspecified: Secondary | ICD-10-CM | POA: Diagnosis not present

## 2018-08-17 DIAGNOSIS — C44622 Squamous cell carcinoma of skin of right upper limb, including shoulder: Secondary | ICD-10-CM | POA: Diagnosis not present

## 2019-02-08 DIAGNOSIS — L570 Actinic keratosis: Secondary | ICD-10-CM | POA: Diagnosis not present

## 2019-02-08 DIAGNOSIS — Z85828 Personal history of other malignant neoplasm of skin: Secondary | ICD-10-CM | POA: Diagnosis not present

## 2019-02-08 DIAGNOSIS — L821 Other seborrheic keratosis: Secondary | ICD-10-CM | POA: Diagnosis not present

## 2019-03-09 ENCOUNTER — Ambulatory Visit (INDEPENDENT_AMBULATORY_CARE_PROVIDER_SITE_OTHER): Payer: Medicare Other | Admitting: Urology

## 2019-03-09 DIAGNOSIS — N401 Enlarged prostate with lower urinary tract symptoms: Secondary | ICD-10-CM | POA: Diagnosis not present

## 2019-03-09 DIAGNOSIS — R310 Gross hematuria: Secondary | ICD-10-CM

## 2019-03-09 DIAGNOSIS — R3912 Poor urinary stream: Secondary | ICD-10-CM

## 2019-04-08 DIAGNOSIS — U071 COVID-19: Secondary | ICD-10-CM

## 2019-04-08 HISTORY — DX: COVID-19: U07.1

## 2019-05-05 DIAGNOSIS — W312XXA Contact with powered woodworking and forming machines, initial encounter: Secondary | ICD-10-CM | POA: Diagnosis not present

## 2019-05-05 DIAGNOSIS — W268XXA Contact with other sharp object(s), not elsewhere classified, initial encounter: Secondary | ICD-10-CM | POA: Diagnosis not present

## 2019-05-05 DIAGNOSIS — S68110A Complete traumatic metacarpophalangeal amputation of right index finger, initial encounter: Secondary | ICD-10-CM | POA: Diagnosis not present

## 2019-05-05 DIAGNOSIS — S68119A Complete traumatic metacarpophalangeal amputation of unspecified finger, initial encounter: Secondary | ICD-10-CM | POA: Diagnosis not present

## 2019-05-05 DIAGNOSIS — S68120A Partial traumatic metacarpophalangeal amputation of right index finger, initial encounter: Secondary | ICD-10-CM | POA: Diagnosis not present

## 2019-08-08 DIAGNOSIS — L570 Actinic keratosis: Secondary | ICD-10-CM | POA: Diagnosis not present

## 2019-08-08 DIAGNOSIS — D485 Neoplasm of uncertain behavior of skin: Secondary | ICD-10-CM | POA: Diagnosis not present

## 2019-12-02 DIAGNOSIS — R5383 Other fatigue: Secondary | ICD-10-CM | POA: Diagnosis not present

## 2019-12-02 DIAGNOSIS — J069 Acute upper respiratory infection, unspecified: Secondary | ICD-10-CM | POA: Diagnosis not present

## 2019-12-02 DIAGNOSIS — Z20822 Contact with and (suspected) exposure to covid-19: Secondary | ICD-10-CM | POA: Diagnosis not present

## 2019-12-10 DIAGNOSIS — R05 Cough: Secondary | ICD-10-CM | POA: Diagnosis not present

## 2019-12-10 DIAGNOSIS — Z20828 Contact with and (suspected) exposure to other viral communicable diseases: Secondary | ICD-10-CM | POA: Diagnosis not present

## 2020-02-01 DIAGNOSIS — Z23 Encounter for immunization: Secondary | ICD-10-CM | POA: Diagnosis not present

## 2020-02-07 DIAGNOSIS — L570 Actinic keratosis: Secondary | ICD-10-CM | POA: Diagnosis not present

## 2020-02-29 DIAGNOSIS — Z23 Encounter for immunization: Secondary | ICD-10-CM | POA: Diagnosis not present

## 2020-03-07 ENCOUNTER — Ambulatory Visit (INDEPENDENT_AMBULATORY_CARE_PROVIDER_SITE_OTHER): Payer: Medicare Other | Admitting: Urology

## 2020-03-07 ENCOUNTER — Encounter: Payer: Self-pay | Admitting: Urology

## 2020-03-07 ENCOUNTER — Other Ambulatory Visit: Payer: Self-pay

## 2020-03-07 VITALS — BP 134/78 | HR 75 | Temp 98.1°F | Ht 74.0 in | Wt 185.0 lb

## 2020-03-07 DIAGNOSIS — R310 Gross hematuria: Secondary | ICD-10-CM

## 2020-03-07 DIAGNOSIS — R351 Nocturia: Secondary | ICD-10-CM

## 2020-03-07 DIAGNOSIS — N401 Enlarged prostate with lower urinary tract symptoms: Secondary | ICD-10-CM | POA: Diagnosis not present

## 2020-03-07 MED ORDER — FINASTERIDE 5 MG PO TABS: 5.0000 mg | ORAL_TABLET | Freq: Every day | ORAL | 3 refills | Status: DC

## 2020-03-07 NOTE — Progress Notes (Signed)

## 2020-03-07 NOTE — Progress Notes (Signed)
03/07/2020 4:28 PM   Edward Beck 14-Sep-1945 [id number]  Referring provider: [name], PA-C [place],  [place] [address]  Followup gross hematuria  HPI: Edward Beck is a 74yo here for followup for BPH and gross hematuria. No gross hematuria since starting finasteride. He has mild LUTS on finasteride. Nocturia 0-1x. Stream strong. NO other complaints. NO recent PSA.    PMH: Past Medical History:  Diagnosis Date  . Condyloma acuminatum of scrotum   . Scrotal lesion     Surgical History: Past Surgical History:  Procedure Laterality Date  . HERNIA REPAIR  2009   left inguinal  . SCROTAL EXPLORATION  11/21/2011   Procedure: SCROTUM EXPLORATION;  Surgeon: Edward Ben, MD;  Location: [facility];  Service: Urology;  Laterality: N/A;  EXCISION, BIOPSY SCROTAL CONDYLOMA     Home Medications:  Allergies as of 03/07/2020      Reactions   Tamiflu [oseltamivir Phosphate] Rash      Medication List       Accurate as of March 07, 2020  4:28 PM. If you have any questions, ask your nurse or doctor.        finasteride 5 MG tablet Commonly known as: PROSCAR Take 5 mg by mouth daily.       Allergies:  Allergies  Allergen Reactions  . Tamiflu [Oseltamivir Phosphate] Rash    Family History: History reviewed. No pertinent family history.  Social History:  reports that he has been smoking cigarettes. He has a 50.00 pack-year smoking history. He does not have any smokeless tobacco history on file. He reports current alcohol use of about 1.0 standard drink of alcohol per week. No history on file for drug use.  ROS: All other review of systems were reviewed and are negative except what is noted above in HPI  Physical Exam: BP 134/78   Pulse 75   Temp 98.1 F (36.7 C)   Ht 6\' 2"  (1.88 m)   Wt 185 lb (83.9 kg)   BMI 23.75 kg/m   Constitutional:  Alert and oriented, No acute distress. HEENT: [place] AT, moist mucus membranes.  Trachea midline, no  masses. Cardiovascular: No clubbing, cyanosis, or edema. Respiratory: Normal respiratory effort, no increased work of breathing. GI: Abdomen is soft, nontender, nondistended, no abdominal masses GU: No CVA tenderness.  Lymph: No cervical or inguinal lymphadenopathy. Skin: No rashes, bruises or suspicious lesions. Neurologic: Grossly intact, no focal deficits, moving all 4 extremities. Psychiatric: Normal mood and affect.  Laboratory Data: Lab Results  Component Value Date   WBC 6.6 11/20/2011   HGB 17.0 11/20/2011   HCT 48.7 11/20/2011   MCV 92.1 11/20/2011   PLT 261 11/20/2011    Lab Results  Component Value Date   CREATININE 1.10 06/05/2017    No results found for: PSA  No results found for: TESTOSTERONE  No results found for: HGBA1C  Urinalysis No results found for: COLORURINE, APPEARANCEUR, LABSPEC, PHURINE, GLUCOSEU, HGBUR, BILIRUBINUR, KETONESUR, PROTEINUR, UROBILINOGEN, NITRITE, LEUKOCYTESUR  No results found for: LABMICR, [place], RBCUA, LABEPIT, MUCUS, BACTERIA  Pertinent Imaging:  No results found for this or any previous visit.  No results found for this or any previous visit.  No results found for this or any previous visit.  No results found for this or any previous visit.  No results found for this or any previous visit.  No results found for this or any previous visit.  No results found for this or any previous visit.  No  results found for this or any previous visit.   Assessment & Plan:    1. Benign localized prostatic hyperplasia with lower urinary tract symptoms (LUTS) -continue finasteride 5mg  - Urinalysis, Routine w reflex microscopic  2. Gross hematuria -continue finasteride 5mg   3. Nocturia -continue finasteride 5mg     Return in about 1 year (around 03/07/2021).  Edward Bang, MD  [facility] [place]

## 2020-03-07 NOTE — Patient Instructions (Signed)

## 2020-03-08 LAB — URINALYSIS, ROUTINE W REFLEX MICROSCOPIC
Bilirubin, UA: NEGATIVE
Glucose, UA: NEGATIVE
Ketones, UA: NEGATIVE
Nitrite, UA: NEGATIVE
Protein,UA: NEGATIVE
RBC, UA: NEGATIVE
Specific Gravity, UA: 1.025 (ref 1.005–1.030)
Urobilinogen, Ur: 0.2 mg/dL (ref 0.2–1.0)
pH, UA: 5.5 (ref 5.0–7.5)

## 2020-03-08 LAB — MICROSCOPIC EXAMINATION
Bacteria, UA: NONE SEEN
Casts: NONE SEEN /lpf
Epithelial Cells (non renal): NONE SEEN /hpf (ref 0–10)
RBC: NONE SEEN /hpf (ref 0–2)
Renal Epithel, UA: NONE SEEN /hpf

## 2020-03-08 LAB — PSA: Prostate Specific Ag, Serum: 1.2 ng/mL (ref 0.0–4.0)

## 2020-03-12 ENCOUNTER — Telehealth: Payer: Self-pay

## 2020-03-12 NOTE — Telephone Encounter (Signed)
-----   Message from Cleon Gustin, MD sent at 03/12/2020 10:18 AM EST ----- PSA normal ----- Message ----- From: Valentina Lucks, LPN Sent: 13/05/4399   8:38 AM EST To: Cleon Gustin, MD  Pls review

## 2020-03-12 NOTE — Telephone Encounter (Signed)
Pts wife made aware testosterone for pt normal.

## 2020-04-03 DIAGNOSIS — Z6826 Body mass index (BMI) 26.0-26.9, adult: Secondary | ICD-10-CM | POA: Diagnosis not present

## 2020-04-03 DIAGNOSIS — J019 Acute sinusitis, unspecified: Secondary | ICD-10-CM | POA: Diagnosis not present

## 2020-08-08 DIAGNOSIS — L570 Actinic keratosis: Secondary | ICD-10-CM | POA: Diagnosis not present

## 2020-08-08 DIAGNOSIS — Z1283 Encounter for screening for malignant neoplasm of skin: Secondary | ICD-10-CM | POA: Diagnosis not present

## 2020-08-08 DIAGNOSIS — Z85828 Personal history of other malignant neoplasm of skin: Secondary | ICD-10-CM | POA: Diagnosis not present

## 2021-01-24 DIAGNOSIS — Z6826 Body mass index (BMI) 26.0-26.9, adult: Secondary | ICD-10-CM | POA: Diagnosis not present

## 2021-01-24 DIAGNOSIS — F17210 Nicotine dependence, cigarettes, uncomplicated: Secondary | ICD-10-CM | POA: Diagnosis not present

## 2021-01-24 DIAGNOSIS — J019 Acute sinusitis, unspecified: Secondary | ICD-10-CM | POA: Diagnosis not present

## 2021-02-11 DIAGNOSIS — Z1283 Encounter for screening for malignant neoplasm of skin: Secondary | ICD-10-CM | POA: Diagnosis not present

## 2021-02-11 DIAGNOSIS — L570 Actinic keratosis: Secondary | ICD-10-CM | POA: Diagnosis not present

## 2021-02-11 DIAGNOSIS — Z85828 Personal history of other malignant neoplasm of skin: Secondary | ICD-10-CM | POA: Diagnosis not present

## 2021-03-08 ENCOUNTER — Other Ambulatory Visit: Payer: Self-pay

## 2021-03-08 ENCOUNTER — Ambulatory Visit (INDEPENDENT_AMBULATORY_CARE_PROVIDER_SITE_OTHER): Payer: Medicare Other | Admitting: Urology

## 2021-03-08 ENCOUNTER — Encounter: Payer: Self-pay | Admitting: Urology

## 2021-03-08 VITALS — BP 155/78 | HR 97

## 2021-03-08 DIAGNOSIS — R310 Gross hematuria: Secondary | ICD-10-CM

## 2021-03-08 DIAGNOSIS — N401 Enlarged prostate with lower urinary tract symptoms: Secondary | ICD-10-CM | POA: Diagnosis not present

## 2021-03-08 DIAGNOSIS — R351 Nocturia: Secondary | ICD-10-CM

## 2021-03-08 LAB — URINALYSIS, ROUTINE W REFLEX MICROSCOPIC
Bilirubin, UA: NEGATIVE
Glucose, UA: NEGATIVE
Ketones, UA: NEGATIVE
Leukocytes,UA: NEGATIVE
Nitrite, UA: NEGATIVE
Protein,UA: NEGATIVE
Specific Gravity, UA: 1.015 (ref 1.005–1.030)
Urobilinogen, Ur: 0.2 mg/dL (ref 0.2–1.0)
pH, UA: 6.5 (ref 5.0–7.5)

## 2021-03-08 LAB — MICROSCOPIC EXAMINATION
Bacteria, UA: NONE SEEN
Epithelial Cells (non renal): NONE SEEN /hpf (ref 0–10)
RBC: NONE SEEN /hpf (ref 0–2)
Renal Epithel, UA: NONE SEEN /hpf
WBC, UA: NONE SEEN /hpf (ref 0–5)

## 2021-03-08 MED ORDER — FINASTERIDE 5 MG PO TABS: 5.0000 mg | ORAL_TABLET | Freq: Every day | ORAL | 3 refills | Status: DC

## 2021-03-08 NOTE — Progress Notes (Signed)
03/08/2021 12:17 PM   Edward Beck 1945-06-25 [id number]  Referring provider: [name], PA-C [place],  [place] [address]  Followup BPH and Gross hematuria   HPI: Edward Beck is a 75yo here for followup for BPH and gross hematuria. IPSS 4 QOL 0. He is on finasteride 5mg  daily.  Nocturia 1x.  He ran out of finasteride 2 weeks ago and noted his LUTS have worsened. Nocturia worsened to 4-5x. No hematuria or dysuria. No other complaints today   PMH: Past Medical History:  Diagnosis Date   Condyloma acuminatum of scrotum    Scrotal lesion     Surgical History: Past Surgical History:  Procedure Laterality Date   HERNIA REPAIR  2009   left inguinal   SCROTAL EXPLORATION  11/21/2011   Procedure: SCROTUM EXPLORATION;  Surgeon: Hanley Ben, MD;  Location: [facility];  Service: Urology;  Laterality: N/A;  EXCISION, BIOPSY SCROTAL CONDYLOMA     Home Medications:  Allergies as of 03/08/2021       Reactions   Tamiflu [oseltamivir Phosphate] Rash        Medication List        Accurate as of March 08, 2021 12:17 PM. If you have any questions, ask your nurse or doctor.          finasteride 5 MG tablet Commonly known as: PROSCAR Take 1 tablet (5 mg total) by mouth daily.        Allergies:  Allergies  Allergen Reactions   Tamiflu [Oseltamivir Phosphate] Rash    Family History: No family history on file.  Social History:  reports that he has been smoking cigarettes. He has a 50.00 pack-year smoking history. He does not have any smokeless tobacco history on file. He reports current alcohol use of about 1.0 standard drink per week. No history on file for drug use.  ROS: All other review of systems were reviewed and are negative except what is noted above in HPI  Physical Exam: BP (!) 155/78   Pulse 97   Constitutional:  Alert and oriented, No acute distress. HEENT: [place] AT, moist mucus membranes.  Trachea midline, no  masses. Cardiovascular: No clubbing, cyanosis, or edema. Respiratory: Normal respiratory effort, no increased work of breathing. GI: Abdomen is soft, nontender, nondistended, no abdominal masses GU: No CVA tenderness.  Lymph: No cervical or inguinal lymphadenopathy. Skin: No rashes, bruises or suspicious lesions. Neurologic: Grossly intact, no focal deficits, moving all 4 extremities. Psychiatric: Normal mood and affect.  Laboratory Data: Lab Results  Component Value Date   WBC 6.6 11/20/2011   HGB 17.0 11/20/2011   HCT 48.7 11/20/2011   MCV 92.1 11/20/2011   PLT 261 11/20/2011    Lab Results  Component Value Date   CREATININE 1.10 06/05/2017    No results found for: PSA  No results found for: TESTOSTERONE  No results found for: HGBA1C  Urinalysis    Component Value Date/Time   APPEARANCEUR Clear 03/07/2020 1547   GLUCOSEU Negative 03/07/2020 1547   BILIRUBINUR Negative 03/07/2020 1547   PROTEINUR Negative 03/07/2020 1547   NITRITE Negative 03/07/2020 1547   LEUKOCYTESUR Trace (A) 03/07/2020 1547    Lab Results  Component Value Date   LABMICR See below: 03/07/2020   WBCUA 6-10 (A) 03/07/2020   LABEPIT None seen 03/07/2020   MUCUS Present 03/07/2020   BACTERIA None seen 03/07/2020    Pertinent Imaging:  No results found for this or any previous visit.  No results found for  this or any previous visit.  No results found for this or any previous visit.  No results found for this or any previous visit.  No results found for this or any previous visit.  No results found for this or any previous visit.  No results found for this or any previous visit.  No results found for this or any previous visit.   Assessment & Plan:    1. Gross hematuria -resolved - Urinalysis, Routine w reflex microscopic  2. Benign localized prostatic hyperplasia with lower urinary tract symptoms (LUTS) -Restart finasteride 5mg    3. Nocturia -Finasteride 5mg   daily   No follow-ups on file.  Nicolette Bang, MD  [facility] [place]

## 2021-03-08 NOTE — Progress Notes (Signed)
Urological Symptom Review  Patient is experiencing the following symptoms: Frequent urination Get up at night to urinate   Review of Systems  Gastrointestinal (upper)  : Negative for upper GI symptoms  Gastrointestinal (lower) : Negative for lower GI symptoms  Constitutional : Negative for symptoms  Skin: Negative for skin symptoms  Eyes: Negative for eye symptoms  Ear/Nose/Throat : Negative for Ear/Nose/Throat symptoms  Hematologic/Lymphatic: Negative for Hematologic/Lymphatic symptoms  Cardiovascular : Negative for cardiovascular symptoms  Respiratory : Cough  Endocrine: Negative for endocrine symptoms  Musculoskeletal: Negative for musculoskeletal symptoms  Neurological: Negative for neurological symptoms  Psychologic: Negative for psychiatric symptoms

## 2021-03-08 NOTE — Patient Instructions (Signed)

## 2021-03-28 DIAGNOSIS — Z20828 Contact with and (suspected) exposure to other viral communicable diseases: Secondary | ICD-10-CM | POA: Diagnosis not present

## 2021-03-28 DIAGNOSIS — R059 Cough, unspecified: Secondary | ICD-10-CM | POA: Diagnosis not present

## 2021-03-28 DIAGNOSIS — R509 Fever, unspecified: Secondary | ICD-10-CM | POA: Diagnosis not present

## 2021-03-28 DIAGNOSIS — F17210 Nicotine dependence, cigarettes, uncomplicated: Secondary | ICD-10-CM | POA: Diagnosis not present

## 2021-04-16 DIAGNOSIS — Z0001 Encounter for general adult medical examination with abnormal findings: Secondary | ICD-10-CM | POA: Diagnosis not present

## 2021-04-16 DIAGNOSIS — R7301 Impaired fasting glucose: Secondary | ICD-10-CM | POA: Diagnosis not present

## 2021-04-16 DIAGNOSIS — E7849 Other hyperlipidemia: Secondary | ICD-10-CM | POA: Diagnosis not present

## 2021-04-16 DIAGNOSIS — E782 Mixed hyperlipidemia: Secondary | ICD-10-CM | POA: Diagnosis not present

## 2021-04-16 DIAGNOSIS — Z1329 Encounter for screening for other suspected endocrine disorder: Secondary | ICD-10-CM | POA: Diagnosis not present

## 2021-04-19 DIAGNOSIS — Z0000 Encounter for general adult medical examination without abnormal findings: Secondary | ICD-10-CM | POA: Diagnosis not present

## 2021-04-19 DIAGNOSIS — R059 Cough, unspecified: Secondary | ICD-10-CM | POA: Diagnosis not present

## 2021-04-19 DIAGNOSIS — R079 Chest pain, unspecified: Secondary | ICD-10-CM | POA: Diagnosis not present

## 2021-04-19 DIAGNOSIS — N401 Enlarged prostate with lower urinary tract symptoms: Secondary | ICD-10-CM | POA: Diagnosis not present

## 2021-04-19 DIAGNOSIS — E7849 Other hyperlipidemia: Secondary | ICD-10-CM | POA: Diagnosis not present

## 2021-04-19 DIAGNOSIS — Z720 Tobacco use: Secondary | ICD-10-CM | POA: Diagnosis not present

## 2021-04-19 DIAGNOSIS — J90 Pleural effusion, not elsewhere classified: Secondary | ICD-10-CM | POA: Diagnosis not present

## 2021-05-16 DIAGNOSIS — J90 Pleural effusion, not elsewhere classified: Secondary | ICD-10-CM | POA: Diagnosis not present

## 2021-05-21 DIAGNOSIS — R918 Other nonspecific abnormal finding of lung field: Secondary | ICD-10-CM | POA: Diagnosis not present

## 2021-05-21 DIAGNOSIS — J439 Emphysema, unspecified: Secondary | ICD-10-CM | POA: Diagnosis not present

## 2021-05-21 DIAGNOSIS — R911 Solitary pulmonary nodule: Secondary | ICD-10-CM | POA: Diagnosis not present

## 2021-05-21 DIAGNOSIS — J90 Pleural effusion, not elsewhere classified: Secondary | ICD-10-CM | POA: Diagnosis not present

## 2021-05-21 DIAGNOSIS — I700 Atherosclerosis of aorta: Secondary | ICD-10-CM | POA: Diagnosis not present

## 2021-05-21 DIAGNOSIS — R0602 Shortness of breath: Secondary | ICD-10-CM | POA: Diagnosis not present

## 2021-05-21 DIAGNOSIS — R599 Enlarged lymph nodes, unspecified: Secondary | ICD-10-CM | POA: Diagnosis not present

## 2021-05-21 DIAGNOSIS — J8410 Pulmonary fibrosis, unspecified: Secondary | ICD-10-CM | POA: Diagnosis not present

## 2021-05-23 DIAGNOSIS — Z6823 Body mass index (BMI) 23.0-23.9, adult: Secondary | ICD-10-CM | POA: Diagnosis not present

## 2021-05-23 DIAGNOSIS — J90 Pleural effusion, not elsewhere classified: Secondary | ICD-10-CM | POA: Diagnosis not present

## 2021-05-23 DIAGNOSIS — R918 Other nonspecific abnormal finding of lung field: Secondary | ICD-10-CM | POA: Diagnosis not present

## 2021-05-23 DIAGNOSIS — F17210 Nicotine dependence, cigarettes, uncomplicated: Secondary | ICD-10-CM | POA: Diagnosis not present

## 2021-05-24 ENCOUNTER — Other Ambulatory Visit (HOSPITAL_COMMUNITY): Payer: Self-pay | Admitting: Physician Assistant

## 2021-05-24 ENCOUNTER — Other Ambulatory Visit: Payer: Self-pay | Admitting: Physician Assistant

## 2021-05-24 DIAGNOSIS — J90 Pleural effusion, not elsewhere classified: Secondary | ICD-10-CM

## 2021-05-24 DIAGNOSIS — R918 Other nonspecific abnormal finding of lung field: Secondary | ICD-10-CM

## 2021-06-06 ENCOUNTER — Encounter (HOSPITAL_COMMUNITY)
Admission: RE | Admit: 2021-06-06 | Discharge: 2021-06-06 | Disposition: A | Discharge status: Home / Self Care | Payer: Medicare Other | Source: Ambulatory Visit | Attending: Physician Assistant | Admitting: Physician Assistant

## 2021-06-06 ENCOUNTER — Other Ambulatory Visit: Payer: Self-pay

## 2021-06-06 DIAGNOSIS — J90 Pleural effusion, not elsewhere classified: Secondary | ICD-10-CM

## 2021-06-06 DIAGNOSIS — J432 Centrilobular emphysema: Secondary | ICD-10-CM | POA: Diagnosis not present

## 2021-06-06 DIAGNOSIS — I2510 Atherosclerotic heart disease of native coronary artery without angina pectoris: Secondary | ICD-10-CM | POA: Diagnosis not present

## 2021-06-06 DIAGNOSIS — J929 Pleural plaque without asbestos: Secondary | ICD-10-CM | POA: Diagnosis not present

## 2021-06-06 DIAGNOSIS — R918 Other nonspecific abnormal finding of lung field: Secondary | ICD-10-CM | POA: Diagnosis not present

## 2021-06-06 IMAGING — PT NM PET TUM IMG INITIAL (PI) SKULL BASE T - THIGH
8 series · 20 of 25 positions shown · non-contrast
Comparison: CT AP scratch set CT chest [DATE]

CLINICAL DATA: Initial treatment strategy for lung nodule the
nodule.

EXAM:
NUCLEAR MEDICINE PET SKULL BASE TO THIGH
TECHNIQUE: 9.15 mCi F-18 FDG was injected intravenously. Full-ring PET imaging
was performed from the skull base to thigh after the radiotracer. CT
data was obtained and used for attenuation correction and anatomic
localization.
Fasting blood glucose: 99 mg/dl

[Series 3: ctac · axial · 3.0mm · 0.98mm/px · z∈[-992,-26]mm · 3 of 323 slices shown]
[im 1/323]
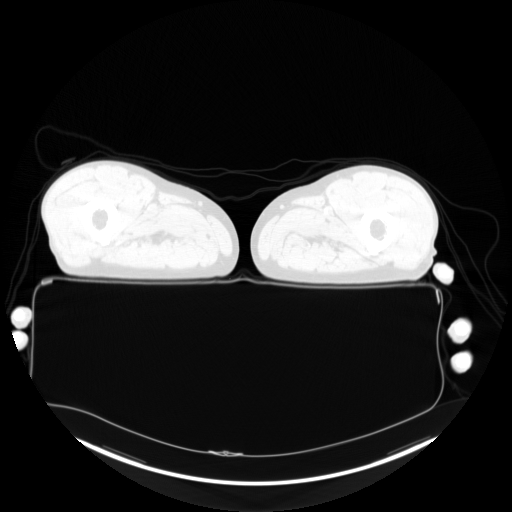
[im 108/323]
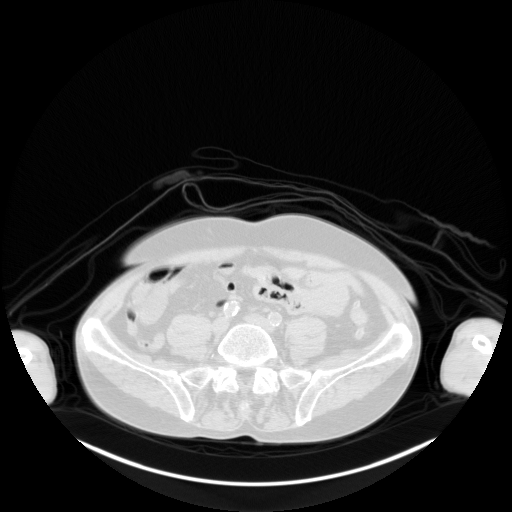
[im 323/323  brain]
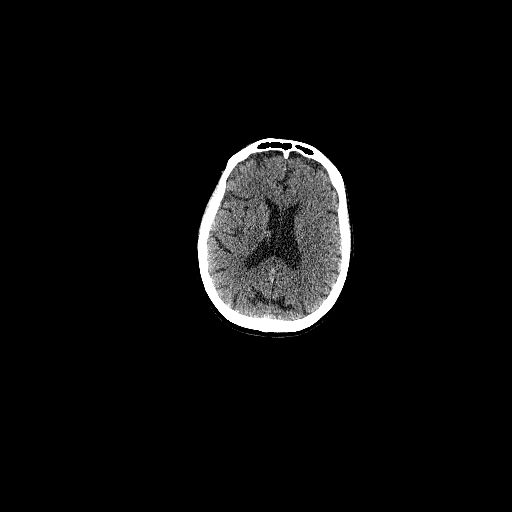

[Series 4: pet ac · axial · 3.0mm · 4.11mm/px · z∈[-992,-350]mm · 3 of 323 slices shown]
[im 1/323]
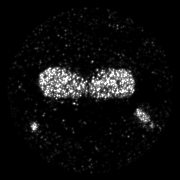
[im 108/323]
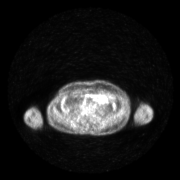
[im 215/323]
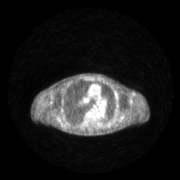

[Series 5: pet nac · axial · 3.0mm · 4.11mm/px · z∈[-992,-26]mm · 4 of 323 slices shown]
[im 1/323]
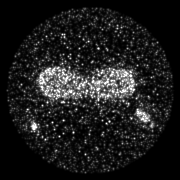
[im 108/323]
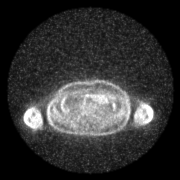
[im 215/323]
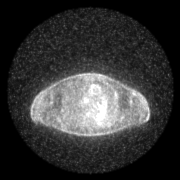
[im 323/323]
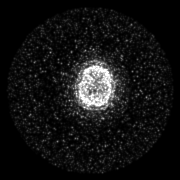

[Series 7: ct lung · axial · 3.0mm · 0.98mm/px · 1 of 109 slices shown]
[im 109/109]
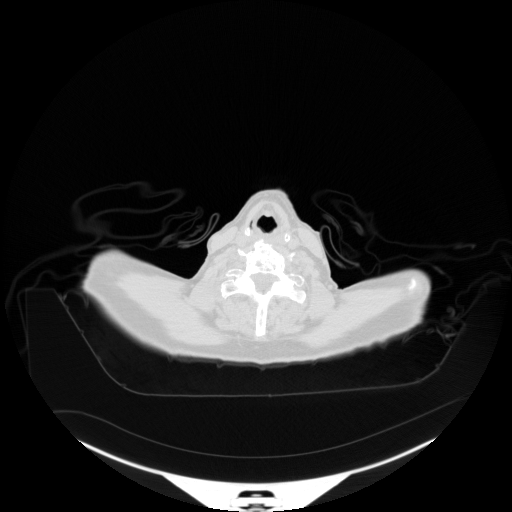

[Series 606: fused tra · 6 of 483 slices shown]
[im 1/483]
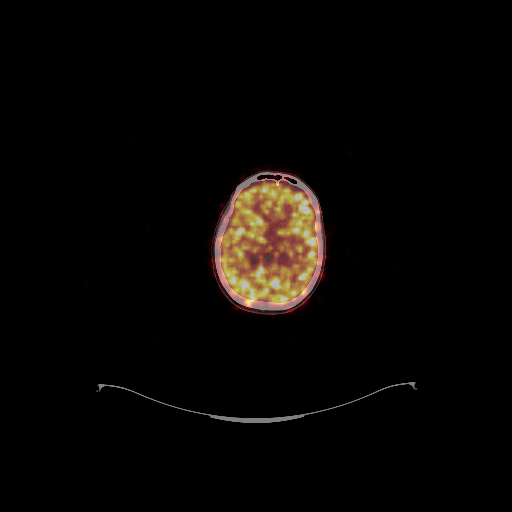
[im 81/483]
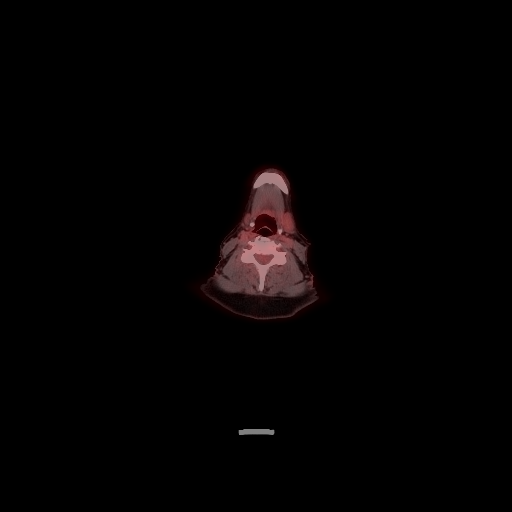
[im 161/483]
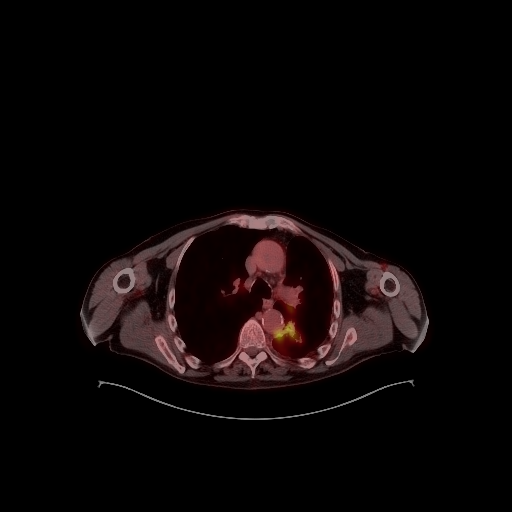
[im 322/483]
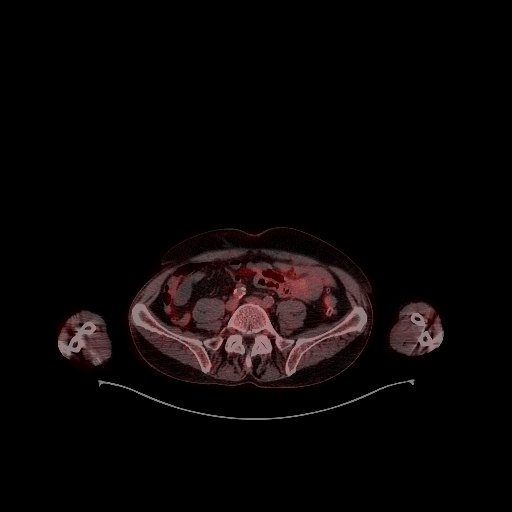
[im 402/483]
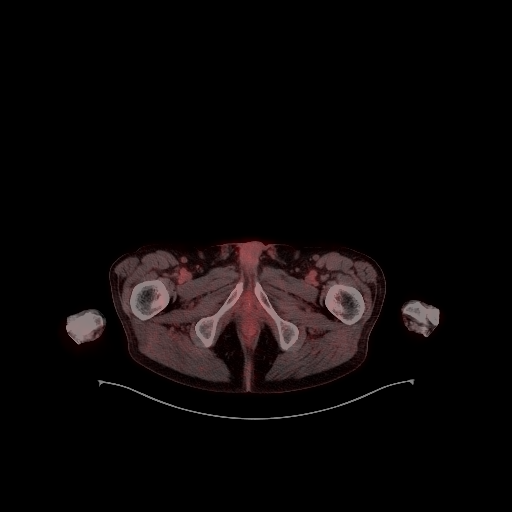
[im 483/483]
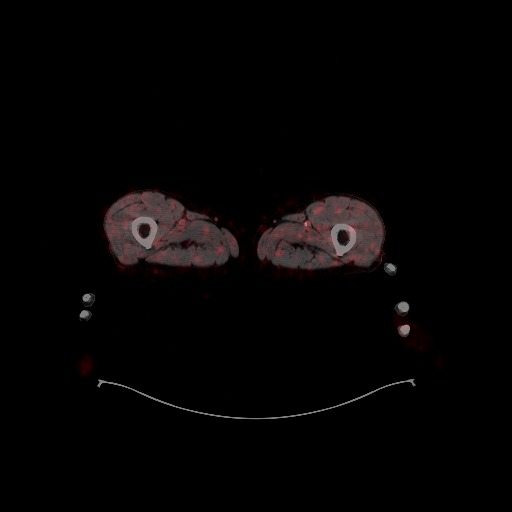

[Series 608: fused cor · 1 of 134 slices shown]
[im 1/134]
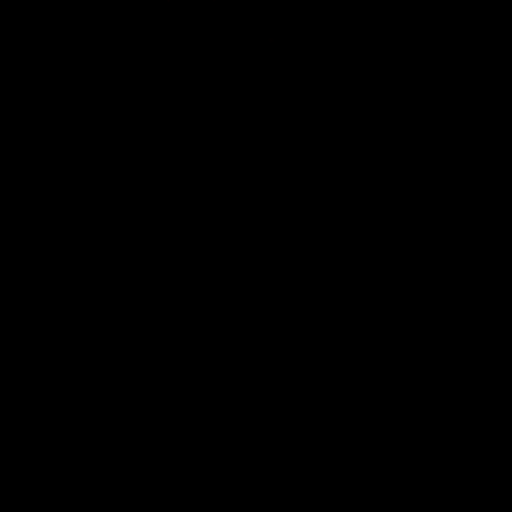

[Series 609: mip cine · coronal · 2.00mm/px · 1 of 48 slices shown]
[im 1/48]
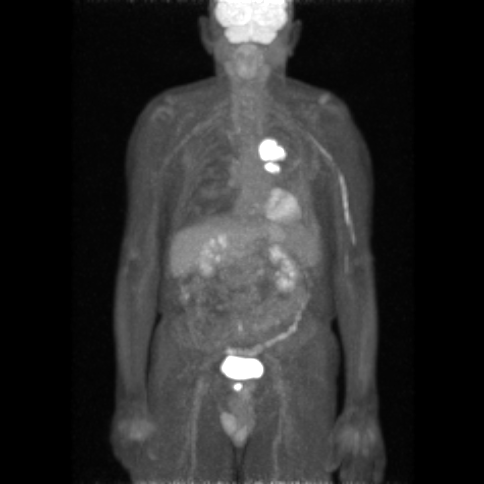

[Series 1038: results mm oncology reading · 1.0mm · 0.50mm/px · 1 of 8 slices shown]
[im 1/8]
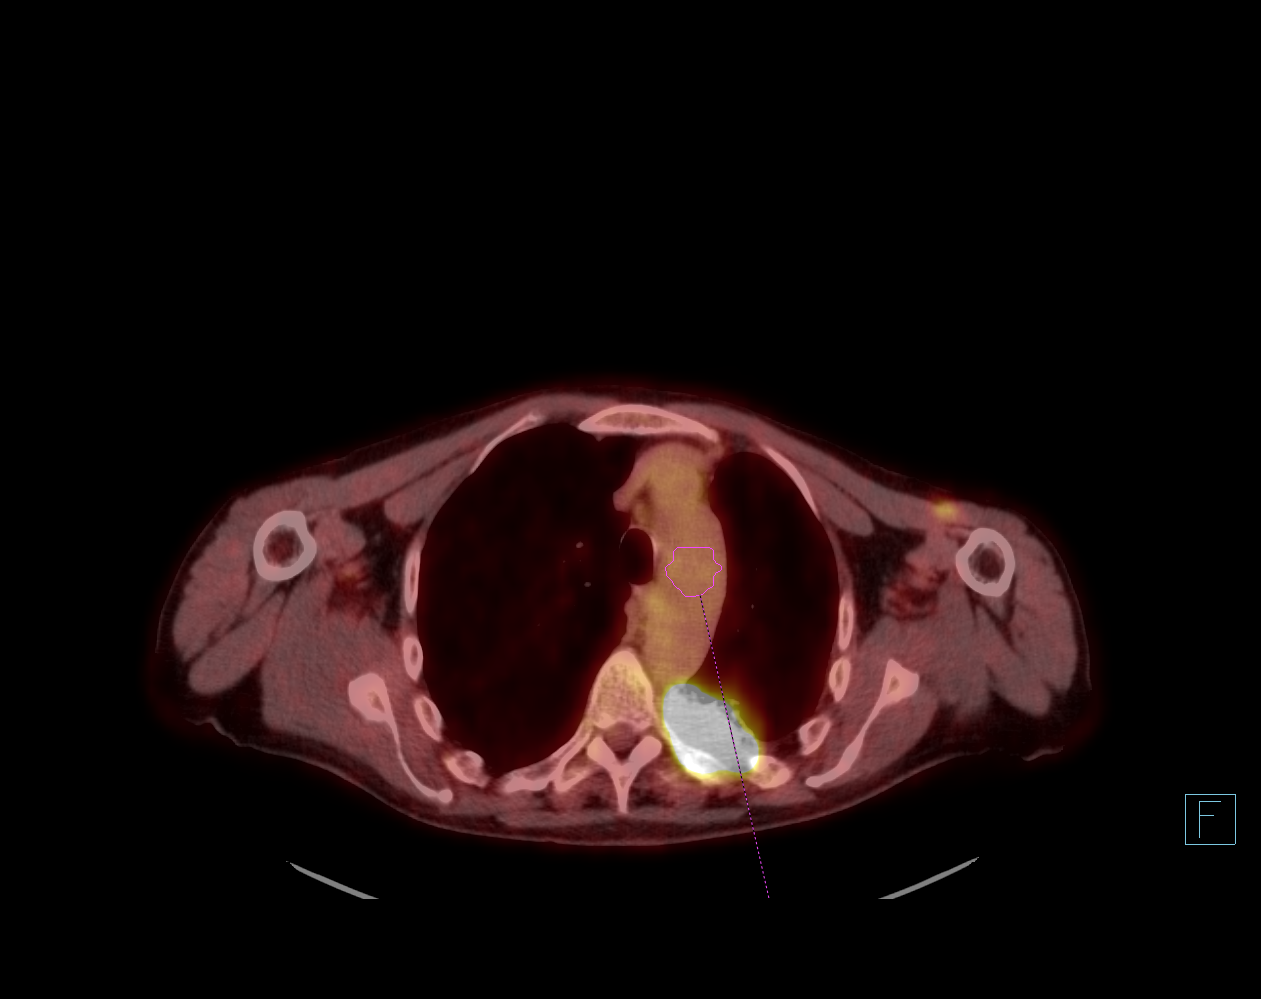

[20 of 25 positions shown; findings below may reference images not displayed]

FINDINGS: Mediastinal blood pool activity: SUV max

Liver activity: SUV max NA

NECK: No hypermetabolic lymph nodes in the neck.

Incidental CT findings: none

CHEST: There is a small left pleural effusion identified. Soft
tissue pleural thickening overlying the lateral left upper lobe and
superior segment of left lower lobe is identified, image 129/3.
Pleural nodule overlying the posterolateral left lower lobe measures
2.5 cm with SUV max of 2.01.

Paravertebral mass within the superior segment of the left lower
lobe measures 4.9 x 3.3 cm and has an SUV max of 27.8, image 103/3.

Tracer avid left hilar lymph node is identified within SUV max of
21.7, image 112/3. No tracer avid axillary, supraclavicular,
mediastinal, subcarinal or right hilar lymph nodes.

6 mm nodule within the right middle lobe is identified with SUV max
of 0.57, image 76/7. This is technically too small to reliably
characterize.

Incidental CT findings: Aortic atherosclerosis. Coronary artery
calcifications. Mild centrilobular and paraseptal emphysema.

ABDOMEN/PELVIS: No abnormal hypermetabolic activity within the
liver, pancreas, adrenal glands, or spleen. No hypermetabolic lymph
nodes in the abdomen or pelvis.

There is a focal area of intense radiotracer uptake localizing to
the right anterior aspect of the prostate gland. This area measures
approximately 2.2 cm with SUV max of 18.26.

Incidental CT findings: Aortic atherosclerosis. Extensive sigmoid
diverticulosis.

SKELETON: No focal hypermetabolic activity to suggest skeletal
metastasis.

There is evidence of chest wall involvement by the superior segment
of left lower lobe lung mass with erosive changes involving the left
sixth and seventh ribs at the costovertebral junction, images 34/7
and 40/7.

Incidental CT findings: none
IMPRESSION: 1. There is intense FDG uptake associated with the paravertebral
mass involving the superior segment of left lower lobe. Signs of
chest wall involvement are identified with erosive changes involving
the adjacent left 6 and seventh ribs near the costovertebral
junction.
2. FDG avid left hilar lymph node compatible with nodal metastasis.
3. Suspect pleural spread of disease with small left pleural
effusion, pleural nodularity, and soft tissue thickening of the
pleura overlying the left upper lobe and superior segment left lower
lobe.
4. Intense focal FDG uptake associated with prostate gland. Cannot
exclude primary prostate neoplasm. Recommend referral to urology for
further management.
5. Aortic Atherosclerosis ([P5]-[P5]) and Emphysema ([P5]-[P5]).

## 2021-06-06 MED ORDER — FLUDEOXYGLUCOSE F - 18 (FDG) INJECTION
9.1500 | Freq: Once | INTRAVENOUS | Status: AC | PRN
Start: 2021-06-06 — End: 2021-06-06
  Administered 2021-06-06: 9.15 via INTRAVENOUS

## 2021-06-18 ENCOUNTER — Encounter: Payer: Self-pay | Admitting: Physician Assistant

## 2021-06-18 ENCOUNTER — Other Ambulatory Visit: Payer: Self-pay

## 2021-06-21 DIAGNOSIS — R948 Abnormal results of function studies of other organs and systems: Secondary | ICD-10-CM | POA: Diagnosis not present

## 2021-06-21 DIAGNOSIS — R918 Other nonspecific abnormal finding of lung field: Secondary | ICD-10-CM | POA: Diagnosis not present

## 2021-06-21 DIAGNOSIS — N402 Nodular prostate without lower urinary tract symptoms: Secondary | ICD-10-CM | POA: Insufficient documentation

## 2021-06-21 DIAGNOSIS — G893 Neoplasm related pain (acute) (chronic): Secondary | ICD-10-CM | POA: Diagnosis not present

## 2021-06-21 DIAGNOSIS — J9811 Atelectasis: Secondary | ICD-10-CM | POA: Diagnosis not present

## 2021-06-26 ENCOUNTER — Encounter: Payer: Self-pay | Admitting: Urology

## 2021-06-26 ENCOUNTER — Ambulatory Visit (INDEPENDENT_AMBULATORY_CARE_PROVIDER_SITE_OTHER): Payer: Medicare Other | Admitting: Urology

## 2021-06-26 ENCOUNTER — Other Ambulatory Visit: Payer: Self-pay

## 2021-06-26 VITALS — BP 132/81 | HR 92

## 2021-06-26 DIAGNOSIS — R351 Nocturia: Secondary | ICD-10-CM | POA: Diagnosis not present

## 2021-06-26 DIAGNOSIS — N401 Enlarged prostate with lower urinary tract symptoms: Secondary | ICD-10-CM | POA: Diagnosis not present

## 2021-06-26 MED ORDER — FINASTERIDE 5 MG PO TABS: 5.0000 mg | ORAL_TABLET | Freq: Every day | ORAL | 3 refills | Status: DC

## 2021-06-26 NOTE — Patient Instructions (Signed)

## 2021-06-26 NOTE — Progress Notes (Signed)
? ?06/26/2021 ?2:48 PM  ? ?Edward Beck ?1946/03/25 ?[id number] ? ?Referring provider: [name], PA-C ?[address] ?[place],  [place] [address] ? ?Prostate nodule ? ? ?HPI: ?[name] is a 76yo here for evaluation of a prostate nodule found on PET CT. PET shows an enhancing left posterior-lateral nodule. PSA 1.25 on finasteride. IPSS 9 QOL 1. Urine stream strong. Nocturia 1x. He has a Lung mass with malignant pleural effusion and 6th/7th rib involvement. He is currently being evaluated at [facility].  No other complaints today.  ? ? ?PMH: ?Past Medical History:  ?Diagnosis Date  ? Condyloma acuminatum of scrotum   ? Scrotal lesion   ? ? ?Surgical History: ?Past Surgical History:  ?Procedure Laterality Date  ? HERNIA REPAIR  2009  ? left inguinal  ? SCROTAL EXPLORATION  11/21/2011  ? Procedure: SCROTUM EXPLORATION;  Surgeon: Hanley Ben, MD;  Location: [facility];  Service: Urology;  Laterality: N/A;  EXCISION, BIOPSY SCROTAL CONDYLOMA ?  ? ? ?Home Medications:  ?Allergies as of 06/26/2021   ? ?   Reactions  ? Tamiflu [oseltamivir Phosphate] Rash  ? ?  ? ?  ?Medication List  ?  ? ?  ? Accurate as of June 26, 2021  2:48 PM. If you have any questions, ask your nurse or doctor.  ?  ?  ? ?  ? ?amoxicillin-clavulanate 875-125 MG tablet ?Commonly known as: AUGMENTIN ?Take 1 tablet by mouth 2 (two) times daily. ?  ?doxycycline 100 MG tablet ?Commonly known as: VIBRA-TABS ?Take 100 mg by mouth 2 (two) times daily. ?  ?finasteride 5 MG tablet ?Commonly known as: PROSCAR ?Take 1 tablet (5 mg total) by mouth daily. ?  ?HYDROcodone-acetaminophen 5-325 MG tablet ?Commonly known as: NORCO/VICODIN ?Take 1 tablet by mouth every 6 (six) hours as needed. ?  ?Lagevrio 200 MG Caps capsule ?Generic drug: molnupiravir EUA ?Take 4 capsules by mouth 2 (two) times daily. ?  ?naproxen sodium 220 MG tablet ?Commonly known as: ALEVE ?Take by mouth. ?  ? ?  ? ? ?Allergies:  ?Allergies  ?Allergen Reactions  ? Tamiflu [Oseltamivir Phosphate]  Rash  ? ? ?Family History: ?No family history on file. ? ?Social History:  reports that he has been smoking cigarettes. He has a 50.00 pack-year smoking history. He does not have any smokeless tobacco history on file. He reports current alcohol use of about 1.0 standard drink per week. No history on file for drug use. ? ?ROS: ?All other review of systems were reviewed and are negative except what is noted above in HPI ? ?Physical Exam: ?BP 132/81   Pulse 92   ?Constitutional:  Alert and oriented, No acute distress. ?HEENT: [place] AT, moist mucus membranes.  Trachea midline, no masses. ?Cardiovascular: No clubbing, cyanosis, or edema. ?Respiratory: Normal respiratory effort, no increased work of breathing. ?GI: Abdomen is soft, nontender, nondistended, no abdominal masses ?GU: No CVA tenderness. Circumcised phallus. No masses/lesions on penis, testis, scrotum. Prostate 30g smooth no nodules no induration.  ?Lymph: No cervical or inguinal lymphadenopathy. ?Skin: No rashes, bruises or suspicious lesions. ?Neurologic: Grossly intact, no focal deficits, moving all 4 extremities. ?Psychiatric: Normal mood and affect. ? ?Laboratory Data: ?Lab Results  ?Component Value Date  ? WBC 6.6 11/20/2011  ? HGB 17.0 11/20/2011  ? HCT 48.7 11/20/2011  ? MCV 92.1 11/20/2011  ? PLT 261 11/20/2011  ? ? ?Lab Results  ?Component Value Date  ? CREATININE 1.10 06/05/2017  ? ? ?No results found for: PSA ? ?  No results found for: TESTOSTERONE ? ?No results found for: HGBA1C ? ?Urinalysis ?   ?Component Value Date/Time  ? APPEARANCEUR Clear 03/08/2021 1214  ? GLUCOSEU Negative 03/08/2021 1214  ? BILIRUBINUR Negative 03/08/2021 1214  ? PROTEINUR Negative 03/08/2021 1214  ? NITRITE Negative 03/08/2021 1214  ? LEUKOCYTESUR Negative 03/08/2021 1214  ? ? ?Lab Results  ?Component Value Date  ? LABMICR See below: 03/08/2021  ? [place] None seen 03/08/2021  ? LABEPIT None seen 03/08/2021  ? MUCUS Present 03/07/2020  ? BACTERIA None seen 03/08/2021   ? ? ?Pertinent Imaging: ? ?No results found for this or any previous visit. ? ?No results found for this or any previous visit. ? ?No results found for this or any previous visit. ? ?No results found for this or any previous visit. ? ?No results found for this or any previous visit. ? ?No results found for this or any previous visit. ? ?No results found for this or any previous visit. ? ?No results found for this or any previous visit. ? ? ?Assessment & Plan:   ? ?1. Benign localized prostatic hyperplasia with lower urinary tract symptoms (LUTS) ?-continue finasteride 5mg  daily ? ?2. Nocturia ?Continue finasteride 5mg  daily ? ?3. Prostate nodule ?-We discussed the management of prostate nodules including observation versus prostate biopsy and the patient elects for observation. RTC 3 months ? ? ?No follow-ups on file. ? ?Nicolette Bang, MD ? ?[facility] ?  ?

## 2021-06-28 ENCOUNTER — Encounter: Payer: Medicare Other | Admitting: Thoracic Surgery (Cardiothoracic Vascular Surgery)

## 2021-07-01 ENCOUNTER — Encounter (HOSPITAL_COMMUNITY): Payer: Self-pay | Admitting: Pulmonary Disease

## 2021-07-01 ENCOUNTER — Ambulatory Visit (INDEPENDENT_AMBULATORY_CARE_PROVIDER_SITE_OTHER): Payer: Medicare Other | Admitting: Pulmonary Disease

## 2021-07-01 ENCOUNTER — Encounter: Payer: Self-pay | Admitting: Pulmonary Disease

## 2021-07-01 ENCOUNTER — Other Ambulatory Visit: Payer: Self-pay

## 2021-07-01 VITALS — BP 134/82 | HR 89 | Temp 98.0°F | Ht 73.0 in | Wt 172.4 lb

## 2021-07-01 DIAGNOSIS — R918 Other nonspecific abnormal finding of lung field: Secondary | ICD-10-CM

## 2021-07-01 DIAGNOSIS — F17200 Nicotine dependence, unspecified, uncomplicated: Secondary | ICD-10-CM

## 2021-07-01 DIAGNOSIS — R599 Enlarged lymph nodes, unspecified: Secondary | ICD-10-CM

## 2021-07-01 NOTE — Progress Notes (Signed)
Spoke with pt's wife for pre-op, pt asked that I talk with her. She states pt does not have a cardiac history, HTN or Diabetes. She states pt had pneumonia in December and that's when they found a lung lesion.  ? ?Gave Mrs. Sokolowski shower instructions for pt.  ? ?Covid test to be done on arrival in AM.  ?

## 2021-07-01 NOTE — Anesthesia Preprocedure Evaluation (Addendum)
Anesthesia Evaluation  ?Patient identified by MRN, date of birth, ID band ?Patient awake ? ? ? ?Reviewed: ?Allergy & Precautions, NPO status , Patient's Chart, lab work & pertinent test results ? ?Airway ?Mallampati: II ? ?TM Distance: >3 FB ?Neck ROM: Full ? ? ? Dental ? ?(+) Dental Advisory Given, Teeth Intact, Missing ?  ?Pulmonary ?pneumonia, Current Smoker and Patient abstained from smoking.,  ?  ?Pulmonary exam normal ?breath sounds clear to auscultation ? ? ? ? ? ? Cardiovascular ?negative cardio ROS ?Normal cardiovascular exam ?Rhythm:Regular Rate:Normal ? ? ?  ?Neuro/Psych ?negative neurological ROS ?   ? GI/Hepatic ?negative GI ROS, Neg liver ROS,   ?Endo/Other  ?negative endocrine ROS ? Renal/GU ?negative Renal ROS  ? ?  ?Musculoskeletal ?negative musculoskeletal ROS ?(+)  ? Abdominal ?  ?Peds ? Hematology ?negative hematology ROS ?(+)   ?Anesthesia Other Findings ? ? Reproductive/Obstetrics ? ?  ? ? ? ? ? ? ? ? ? ? ? ? ? ?  ?  ? ? ? ? ? ? ? ?Anesthesia Physical ?Anesthesia Plan ? ?ASA: 3 ? ?Anesthesia Plan: General  ? ?Post-op Pain Management: Minimal or no pain anticipated  ? ?Induction: Intravenous ? ?PONV Risk Score and Plan: 1 and Treatment may vary due to age or medical condition, Ondansetron and Dexamethasone ? ?Airway Management Planned: Oral ETT ? ?Additional Equipment: None ? ?Intra-op Plan:  ? ?Post-operative Plan: Extubation in OR ? ?Informed Consent: I have reviewed the patients History and Physical, chart, labs and discussed the procedure including the risks, benefits and alternatives for the proposed anesthesia with the patient or authorized representative who has indicated his/her understanding and acceptance.  ? ? ? ?Dental advisory given ? ?Plan Discussed with: CRNA ? ?Anesthesia Plan Comments:   ? ? ? ? ? ?Anesthesia Quick Evaluation ? ?

## 2021-07-01 NOTE — Progress Notes (Signed)
? ?Synopsis: Referred in March 2023 for lung mass by [name], * ? ?Subjective:  ? ?PATIENT ID: Edward Beck GENDER: male DOB: Nov 12, 1945, MRN: [id number] ? ?Chief Complaint  ?Patient presents with  ? Follow-up  ?  Patient is here to Discuss biopsy  ? ? ?This is a 76 year old gentleman, Longstanding history of tobacco use, current every day smoker.  Patient had pneumonia was treated with antibiotics continued to have chest pain.  Subsequent chest x-ray revealed concern for bony involvement of the lesion inIn the posterior left upper lobe.  Patient now also has a small amount of pleural fluid and pleural thickening.  Patient underwent nuclear medicine PET scan imaging with a hypermetabolic uptake within the lesion in the left upper lobe with associated hilar adenopathy and pleural involvement.  There is a small amount of fluid within his chest to.  Concern for an advanced age bronchogenic carcinoma the patient was referred here for consideration for tissue biopsy.  Left upper lobe hypermetabolic lung lesion with associated hilar adenopathy concerning for an advanced age bronchogenic carcinoma.  He also has a small ? ? ?Past Medical History:  ?Diagnosis Date  ? Condyloma acuminatum of scrotum   ? Scrotal lesion   ?  ? ?No family history on file.  ? ?Past Surgical History:  ?Procedure Laterality Date  ? HERNIA REPAIR  2009  ? left inguinal  ? SCROTAL EXPLORATION  11/21/2011  ? Procedure: SCROTUM EXPLORATION;  Surgeon: Hanley Ben, MD;  Location: [facility];  Service: Urology;  Laterality: N/A;  EXCISION, BIOPSY SCROTAL CONDYLOMA ?  ? ? ?Social History  ? ?Socioeconomic History  ? Marital status: Married  ?  Spouse name: Not on file  ? Number of children: Not on file  ? Years of education: Not on file  ? Highest education level: Not on file  ?Occupational History  ? Not on file  ?Tobacco Use  ? Smoking status: Every Day  ?  Packs/day: 1.00  ?  Years: 50.00  ?  Pack years: 50.00  ?  Types: Cigarettes  ?  Smokeless tobacco: Not on file  ?Substance and Sexual Activity  ? Alcohol use: Yes  ?  Alcohol/week: 1.0 standard drink  ?  Types: 1 Cans of beer per week  ?  Comment: occassionally  ? Drug use: Not on file  ? Sexual activity: Not on file  ?Other Topics Concern  ? Not on file  ?Social History Narrative  ? Not on file  ? ?Social Determinants of Health  ? ?Financial Resource Strain: Not on file  ?Food Insecurity: Not on file  ?Transportation Needs: Not on file  ?Physical Activity: Not on file  ?Stress: Not on file  ?Social Connections: Not on file  ?Intimate Partner Violence: Not on file  ?  ? ?Allergies  ?Allergen Reactions  ? Tamiflu [Oseltamivir Phosphate] Rash  ?  ? ?Outpatient Medications Prior to Visit  ?Medication Sig Dispense Refill  ? amoxicillin-clavulanate (AUGMENTIN) 875-125 MG tablet Take 1 tablet by mouth 2 (two) times daily.    ? doxycycline (VIBRA-TABS) 100 MG tablet Take 100 mg by mouth 2 (two) times daily.    ? finasteride (PROSCAR) 5 MG tablet Take 1 tablet (5 mg total) by mouth daily. 90 tablet 3  ? HYDROcodone-acetaminophen (NORCO/VICODIN) 5-325 MG tablet Take 1 tablet by mouth every 6 (six) hours as needed.    ? LAGEVRIO 200 MG CAPS capsule Take 4 capsules by mouth 2 (two) times daily.    ? naproxen  sodium (ALEVE) 220 MG tablet Take by mouth.    ? ?No facility-administered medications prior to visit.  ? ? ?Review of Systems  ?Constitutional:  Negative for chills, fever, malaise/fatigue and weight loss.  ?HENT:  Negative for hearing loss, sore throat and tinnitus.   ?Eyes:  Negative for blurred vision and double vision.  ?Respiratory:  Positive for cough and sputum production. Negative for hemoptysis, shortness of breath, wheezing and stridor.   ?Cardiovascular:  Negative for chest pain, palpitations, orthopnea, leg swelling and PND.  ?Gastrointestinal:  Negative for abdominal pain, constipation, diarrhea, heartburn, nausea and vomiting.  ?Genitourinary:  Negative for dysuria, hematuria and  urgency.  ?Musculoskeletal:  Negative for joint pain and myalgias.  ?Skin:  Negative for itching and rash.  ?Neurological:  Negative for dizziness, tingling, weakness and headaches.  ?Endo/Heme/Allergies:  Negative for environmental allergies. Does not bruise/bleed easily.  ?Psychiatric/Behavioral:  Negative for depression. The patient is not nervous/anxious and does not have insomnia.   ?All other systems reviewed and are negative. ? ? ?Objective:  ?Physical Exam ? ? ?Vitals:  ? 07/01/21 0902  ?BP: 134/82  ?Pulse: 89  ?Temp: 98 ?F (36.7 ?C)  ?TempSrc: Oral  ?SpO2: 97%  ?Weight: 172 lb 6.4 oz (78.2 kg)  ?Height: 6\' 1"  (1.854 m)  ? ?97% on RA ?BMI Readings from Last 3 Encounters:  ?07/01/21 22.75 kg/m?  ?03/07/20 23.75 kg/m?  ?11/20/11 25.81 kg/m?  ? ?Wt Readings from Last 3 Encounters:  ?07/01/21 172 lb 6.4 oz (78.2 kg)  ?03/07/20 185 lb (83.9 kg)  ?11/20/11 201 lb (91.2 kg)  ? ? ? ?CBC ?   ?Component Value Date/Time  ? WBC 6.6 11/20/2011 1000  ? RBC 5.29 11/20/2011 1000  ? HGB 17.0 11/20/2011 1000  ? HCT 48.7 11/20/2011 1000  ? PLT 261 11/20/2011 1000  ? MCV 92.1 11/20/2011 1000  ? MCH 32.1 11/20/2011 1000  ? MCHC 34.9 11/20/2011 1000  ? RDW 12.4 11/20/2011 1000  ? ? ? ?Chest Imaging: ?06/07/2021 nuclear medicine pet imaging: ?Hypermetabolic paravertebral mass with fifth sixth rib invasion. ?Concerning for advanced age bronchogenic carcinoma. ?The patient's images have been independently reviewed by me.   ? ?Pulmonary Functions Testing Results: ?   ? View : No data to display.  ?  ?  ?  ? ? ?FeNO:  ? ?Pathology:  ? ?Echocardiogram:  ? ?Heart Catheterization:  ?   ?Assessment & Plan:  ? ?  ICD-10-CM   ?1. Lung mass  R91.8 Ambulatory referral to Pulmonology  ?  Procedural/ Surgical Case Request: ROBOTIC ASSISTED NAVIGATIONAL BRONCHOSCOPY, VIDEO BRONCHOSCOPY WITH ENDOBRONCHIAL ULTRASOUND  ?  CT Super D Chest Wo Contrast  ?  ?2. Adenopathy  R59.9   ?  ?3. Smoker  F17.200   ?  ? ? ?Discussion: ? ?This is a 76 year old  gentleman, longstanding history of tobacco abuse, found to have a left upper lobe lung lesion with rib involvement, small hilar node enlargement.  He has a small amount of pleural fluid on his left side.  There is pleural thickening also involved.  I suspect he has an advanced age bronchogenic carcinoma and he needs tissue diagnostics. ? ?Plan: ?We will proceed with video bronchoscopy navigational biopsy. ?Patient will also need video bronchoscopy with endobronchial ultrasound and transbronchial needle aspirations. ?This will be used to obtain enough tissue for molecular diagnostics. ?We talked about this today in the office.  He is scheduled tomorrow for a interventional radiology drainage of the pleural fluid on the left side. ?  I ultrasounded this today in the office and there is less than 1 inch of pleural fluid on this side.  I explained that I would probably hold off on having that procedure done. ?I will take a look at his chest next week for the biopsy date and if we need to drain it then we can also take fluid out of his chest on that day. ? ?Patient is agreeable to this plan. ?Tentative bronchoscopy date will be 07/02/2021.  Originally on discussion with the patient in the office was noted before 07/09/2021.  However we were able to get this moved up after a cancellation for case tomorrow.  We will try to make sure that patient is able to do it tomorrow ? ? ?Current Outpatient Medications:  ?  amoxicillin-clavulanate (AUGMENTIN) 875-125 MG tablet, Take 1 tablet by mouth 2 (two) times daily., Disp: , Rfl:  ?  doxycycline (VIBRA-TABS) 100 MG tablet, Take 100 mg by mouth 2 (two) times daily., Disp: , Rfl:  ?  finasteride (PROSCAR) 5 MG tablet, Take 1 tablet (5 mg total) by mouth daily., Disp: 90 tablet, Rfl: 3 ?  HYDROcodone-acetaminophen (NORCO/VICODIN) 5-325 MG tablet, Take 1 tablet by mouth every 6 (six) hours as needed., Disp: , Rfl:  ?  LAGEVRIO 200 MG CAPS capsule, Take 4 capsules by mouth 2 (two) times  daily., Disp: , Rfl:  ?  naproxen sodium (ALEVE) 220 MG tablet, Take by mouth., Disp: , Rfl:  ? ?I spent 62 minutes dedicated to the care of this patient on the date of this encounter to include pre-visit review of r

## 2021-07-01 NOTE — H&P (View-Only) (Signed)
? ?Synopsis: Referred in March 2023 for lung mass by [name], * ? ?Subjective:  ? ?PATIENT ID: Edward Beck GENDER: male DOB: 10-31-1945, MRN: [id number] ? ?Chief Complaint  ?Patient presents with  ? Follow-up  ?  Patient is here to Discuss biopsy  ? ? ?This is a 76 year old gentleman, Longstanding history of tobacco use, current every day smoker.  Patient had pneumonia was treated with antibiotics continued to have chest pain.  Subsequent chest x-ray revealed concern for bony involvement of the lesion inIn the posterior left upper lobe.  Patient now also has a small amount of pleural fluid and pleural thickening.  Patient underwent nuclear medicine PET scan imaging with a hypermetabolic uptake within the lesion in the left upper lobe with associated hilar adenopathy and pleural involvement.  There is a small amount of fluid within his chest to.  Concern for an advanced age bronchogenic carcinoma the patient was referred here for consideration for tissue biopsy.  Left upper lobe hypermetabolic lung lesion with associated hilar adenopathy concerning for an advanced age bronchogenic carcinoma.  He also has a small ? ? ?Past Medical History:  ?Diagnosis Date  ? Condyloma acuminatum of scrotum   ? Scrotal lesion   ?  ? ?No family history on file.  ? ?Past Surgical History:  ?Procedure Laterality Date  ? HERNIA REPAIR  2009  ? left inguinal  ? SCROTAL EXPLORATION  11/21/2011  ? Procedure: SCROTUM EXPLORATION;  Surgeon: Hanley Ben, MD;  Location: [facility];  Service: Urology;  Laterality: N/A;  EXCISION, BIOPSY SCROTAL CONDYLOMA ?  ? ? ?Social History  ? ?Socioeconomic History  ? Marital status: Married  ?  Spouse name: Not on file  ? Number of children: Not on file  ? Years of education: Not on file  ? Highest education level: Not on file  ?Occupational History  ? Not on file  ?Tobacco Use  ? Smoking status: Every Day  ?  Packs/day: 1.00  ?  Years: 50.00  ?  Pack years: 50.00  ?  Types: Cigarettes  ?  Smokeless tobacco: Not on file  ?Substance and Sexual Activity  ? Alcohol use: Yes  ?  Alcohol/week: 1.0 standard drink  ?  Types: 1 Cans of beer per week  ?  Comment: occassionally  ? Drug use: Not on file  ? Sexual activity: Not on file  ?Other Topics Concern  ? Not on file  ?Social History Narrative  ? Not on file  ? ?Social Determinants of Health  ? ?Financial Resource Strain: Not on file  ?Food Insecurity: Not on file  ?Transportation Needs: Not on file  ?Physical Activity: Not on file  ?Stress: Not on file  ?Social Connections: Not on file  ?Intimate Partner Violence: Not on file  ?  ? ?Allergies  ?Allergen Reactions  ? Tamiflu [Oseltamivir Phosphate] Rash  ?  ? ?Outpatient Medications Prior to Visit  ?Medication Sig Dispense Refill  ? amoxicillin-clavulanate (AUGMENTIN) 875-125 MG tablet Take 1 tablet by mouth 2 (two) times daily.    ? doxycycline (VIBRA-TABS) 100 MG tablet Take 100 mg by mouth 2 (two) times daily.    ? finasteride (PROSCAR) 5 MG tablet Take 1 tablet (5 mg total) by mouth daily. 90 tablet 3  ? HYDROcodone-acetaminophen (NORCO/VICODIN) 5-325 MG tablet Take 1 tablet by mouth every 6 (six) hours as needed.    ? LAGEVRIO 200 MG CAPS capsule Take 4 capsules by mouth 2 (two) times daily.    ? naproxen  sodium (ALEVE) 220 MG tablet Take by mouth.    ? ?No facility-administered medications prior to visit.  ? ? ?Review of Systems  ?Constitutional:  Negative for chills, fever, malaise/fatigue and weight loss.  ?HENT:  Negative for hearing loss, sore throat and tinnitus.   ?Eyes:  Negative for blurred vision and double vision.  ?Respiratory:  Positive for cough and sputum production. Negative for hemoptysis, shortness of breath, wheezing and stridor.   ?Cardiovascular:  Negative for chest pain, palpitations, orthopnea, leg swelling and PND.  ?Gastrointestinal:  Negative for abdominal pain, constipation, diarrhea, heartburn, nausea and vomiting.  ?Genitourinary:  Negative for dysuria, hematuria and  urgency.  ?Musculoskeletal:  Negative for joint pain and myalgias.  ?Skin:  Negative for itching and rash.  ?Neurological:  Negative for dizziness, tingling, weakness and headaches.  ?Endo/Heme/Allergies:  Negative for environmental allergies. Does not bruise/bleed easily.  ?Psychiatric/Behavioral:  Negative for depression. The patient is not nervous/anxious and does not have insomnia.   ?All other systems reviewed and are negative. ? ? ?Objective:  ?Physical Exam ? ? ?Vitals:  ? 07/01/21 0902  ?BP: 134/82  ?Pulse: 89  ?Temp: 98 ?F (36.7 ?C)  ?TempSrc: Oral  ?SpO2: 97%  ?Weight: 172 lb 6.4 oz (78.2 kg)  ?Height: 6\' 1"  (1.854 m)  ? ?97% on RA ?BMI Readings from Last 3 Encounters:  ?07/01/21 22.75 kg/m?  ?03/07/20 23.75 kg/m?  ?11/20/11 25.81 kg/m?  ? ?Wt Readings from Last 3 Encounters:  ?07/01/21 172 lb 6.4 oz (78.2 kg)  ?03/07/20 185 lb (83.9 kg)  ?11/20/11 201 lb (91.2 kg)  ? ? ? ?CBC ?   ?Component Value Date/Time  ? WBC 6.6 11/20/2011 1000  ? RBC 5.29 11/20/2011 1000  ? HGB 17.0 11/20/2011 1000  ? HCT 48.7 11/20/2011 1000  ? PLT 261 11/20/2011 1000  ? MCV 92.1 11/20/2011 1000  ? MCH 32.1 11/20/2011 1000  ? MCHC 34.9 11/20/2011 1000  ? RDW 12.4 11/20/2011 1000  ? ? ? ?Chest Imaging: ?06/07/2021 nuclear medicine pet imaging: ?Hypermetabolic paravertebral mass with fifth sixth rib invasion. ?Concerning for advanced age bronchogenic carcinoma. ?The patient's images have been independently reviewed by me.   ? ?Pulmonary Functions Testing Results: ?   ? View : No data to display.  ?  ?  ?  ? ? ?FeNO:  ? ?Pathology:  ? ?Echocardiogram:  ? ?Heart Catheterization:  ?   ?Assessment & Plan:  ? ?  ICD-10-CM   ?1. Lung mass  R91.8 Ambulatory referral to Pulmonology  ?  Procedural/ Surgical Case Request: ROBOTIC ASSISTED NAVIGATIONAL BRONCHOSCOPY, VIDEO BRONCHOSCOPY WITH ENDOBRONCHIAL ULTRASOUND  ?  CT Super D Chest Wo Contrast  ?  ?2. Adenopathy  R59.9   ?  ?3. Smoker  F17.200   ?  ? ? ?Discussion: ? ?This is a 76 year old  gentleman, longstanding history of tobacco abuse, found to have a left upper lobe lung lesion with rib involvement, small hilar node enlargement.  He has a small amount of pleural fluid on his left side.  There is pleural thickening also involved.  I suspect he has an advanced age bronchogenic carcinoma and he needs tissue diagnostics. ? ?Plan: ?We will proceed with video bronchoscopy navigational biopsy. ?Patient will also need video bronchoscopy with endobronchial ultrasound and transbronchial needle aspirations. ?This will be used to obtain enough tissue for molecular diagnostics. ?We talked about this today in the office.  He is scheduled tomorrow for a interventional radiology drainage of the pleural fluid on the left side. ?  I ultrasounded this today in the office and there is less than 1 inch of pleural fluid on this side.  I explained that I would probably hold off on having that procedure done. ?I will take a look at his chest next week for the biopsy date and if we need to drain it then we can also take fluid out of his chest on that day. ? ?Patient is agreeable to this plan. ?Tentative bronchoscopy date will be 07/02/2021.  Originally on discussion with the patient in the office was noted before 07/09/2021.  However we were able to get this moved up after a cancellation for case tomorrow.  We will try to make sure that patient is able to do it tomorrow ? ? ?Current Outpatient Medications:  ?  amoxicillin-clavulanate (AUGMENTIN) 875-125 MG tablet, Take 1 tablet by mouth 2 (two) times daily., Disp: , Rfl:  ?  doxycycline (VIBRA-TABS) 100 MG tablet, Take 100 mg by mouth 2 (two) times daily., Disp: , Rfl:  ?  finasteride (PROSCAR) 5 MG tablet, Take 1 tablet (5 mg total) by mouth daily., Disp: 90 tablet, Rfl: 3 ?  HYDROcodone-acetaminophen (NORCO/VICODIN) 5-325 MG tablet, Take 1 tablet by mouth every 6 (six) hours as needed., Disp: , Rfl:  ?  LAGEVRIO 200 MG CAPS capsule, Take 4 capsules by mouth 2 (two) times  daily., Disp: , Rfl:  ?  naproxen sodium (ALEVE) 220 MG tablet, Take by mouth., Disp: , Rfl:  ? ?I spent 62 minutes dedicated to the care of this patient on the date of this encounter to include pre-visit review of r

## 2021-07-01 NOTE — Patient Instructions (Signed)
Thank you for visiting Dr. Valeta Harms at [facility]. ?Today we recommend the following: ?Orders Placed This Encounter  ?Procedures  ? Procedural/ Surgical Case Request: ROBOTIC ASSISTED NAVIGATIONAL BRONCHOSCOPY, VIDEO BRONCHOSCOPY WITH ENDOBRONCHIAL ULTRASOUND  ? Ambulatory referral to Pulmonology  ? ?Bronchoscopy 07/09/2021 ? ?Return in about 15 days (around 07/16/2021) for w/ Eric Form, NP . ? ? ? ?Please do your part to reduce the spread of COVID-19.  ? ?

## 2021-07-02 ENCOUNTER — Other Ambulatory Visit: Payer: Self-pay | Admitting: Acute Care

## 2021-07-02 ENCOUNTER — Ambulatory Visit (HOSPITAL_COMMUNITY)
Admission: RE | Admit: 2021-07-02 | Discharge: 2021-07-02 | Disposition: A | Discharge status: Home / Self Care | Payer: Medicare Other | Source: Home / Self Care | Attending: Pulmonary Disease | Admitting: Pulmonary Disease

## 2021-07-02 ENCOUNTER — Ambulatory Visit (HOSPITAL_COMMUNITY)
Admission: RE | Admit: 2021-07-02 | Discharge: 2021-07-02 | Disposition: A | Discharge status: Home / Self Care | Payer: Medicare Other | Attending: Pulmonary Disease | Admitting: Pulmonary Disease

## 2021-07-02 ENCOUNTER — Ambulatory Visit (HOSPITAL_COMMUNITY): Payer: Medicare Other

## 2021-07-02 ENCOUNTER — Ambulatory Visit (HOSPITAL_BASED_OUTPATIENT_CLINIC_OR_DEPARTMENT_OTHER): Payer: Medicare Other | Admitting: Anesthesiology

## 2021-07-02 ENCOUNTER — Ambulatory Visit (HOSPITAL_COMMUNITY): Payer: Medicare Other | Admitting: Anesthesiology

## 2021-07-02 ENCOUNTER — Encounter (HOSPITAL_COMMUNITY)
Admission: RE | Disposition: A | Discharge status: Home / Self Care | Payer: Self-pay | Source: Home / Self Care | Attending: Pulmonary Disease

## 2021-07-02 ENCOUNTER — Encounter (HOSPITAL_COMMUNITY): Payer: Self-pay | Admitting: Pulmonary Disease

## 2021-07-02 DIAGNOSIS — C3432 Malignant neoplasm of lower lobe, left bronchus or lung: Secondary | ICD-10-CM | POA: Insufficient documentation

## 2021-07-02 DIAGNOSIS — R918 Other nonspecific abnormal finding of lung field: Secondary | ICD-10-CM

## 2021-07-02 DIAGNOSIS — Z79899 Other long term (current) drug therapy: Secondary | ICD-10-CM | POA: Insufficient documentation

## 2021-07-02 DIAGNOSIS — J90 Pleural effusion, not elsewhere classified: Secondary | ICD-10-CM | POA: Diagnosis not present

## 2021-07-02 DIAGNOSIS — F17210 Nicotine dependence, cigarettes, uncomplicated: Secondary | ICD-10-CM | POA: Diagnosis not present

## 2021-07-02 DIAGNOSIS — J189 Pneumonia, unspecified organism: Secondary | ICD-10-CM | POA: Diagnosis not present

## 2021-07-02 DIAGNOSIS — R911 Solitary pulmonary nodule: Secondary | ICD-10-CM | POA: Diagnosis not present

## 2021-07-02 DIAGNOSIS — J9811 Atelectasis: Secondary | ICD-10-CM | POA: Diagnosis not present

## 2021-07-02 HISTORY — PX: BRONCHIAL NEEDLE ASPIRATION BIOPSY: SHX5106

## 2021-07-02 HISTORY — PX: VIDEO BRONCHOSCOPY WITH ENDOBRONCHIAL ULTRASOUND: SHX6177

## 2021-07-02 HISTORY — DX: Pneumonia, unspecified organism: J18.9

## 2021-07-02 HISTORY — PX: BRONCHIAL BRUSHINGS: SHX5108

## 2021-07-02 HISTORY — PX: BRONCHIAL BIOPSY: SHX5109

## 2021-07-02 SURGERY — BRONCHOSCOPY, WITH BIOPSY USING ELECTROMAGNETIC NAVIGATION
Anesthesia: General | Laterality: Left

## 2021-07-02 MED ORDER — ONDANSETRON HCL 4 MG/2ML IJ SOLN
INTRAMUSCULAR | Status: DC | PRN
Start: 2021-07-02 — End: 2021-07-02
  Administered 2021-07-02: 4 mg via INTRAVENOUS

## 2021-07-02 MED ORDER — FENTANYL CITRATE (PF) 100 MCG/2ML IJ SOLN: 25.0000 ug | INTRAMUSCULAR | Status: DC | PRN

## 2021-07-02 MED ORDER — ROCURONIUM BROMIDE 10 MG/ML (PF) SYRINGE
PREFILLED_SYRINGE | INTRAVENOUS | Status: DC | PRN
  Administered 2021-07-02: 50 mg via INTRAVENOUS

## 2021-07-02 MED ORDER — DEXAMETHASONE SODIUM PHOSPHATE 10 MG/ML IJ SOLN
INTRAMUSCULAR | Status: DC | PRN
  Administered 2021-07-02: 10 mg via INTRAVENOUS

## 2021-07-02 MED ORDER — LACTATED RINGERS IV SOLN: INTRAVENOUS | Status: DC

## 2021-07-02 MED ORDER — DROPERIDOL 2.5 MG/ML IJ SOLN: 0.6250 mg | Freq: Once | INTRAMUSCULAR | Status: DC | PRN

## 2021-07-02 MED ORDER — FENTANYL CITRATE (PF) 250 MCG/5ML IJ SOLN
INTRAMUSCULAR | Status: DC | PRN
  Administered 2021-07-02: 100 ug via INTRAVENOUS

## 2021-07-02 MED ORDER — PROPOFOL 10 MG/ML IV BOLUS
INTRAVENOUS | Status: DC | PRN
  Administered 2021-07-02: 120 mg via INTRAVENOUS

## 2021-07-02 MED ORDER — LIDOCAINE 2% (20 MG/ML) 5 ML SYRINGE
INTRAMUSCULAR | Status: DC | PRN
  Administered 2021-07-02: 80 mg via INTRAVENOUS

## 2021-07-02 MED ORDER — SUGAMMADEX SODIUM 200 MG/2ML IV SOLN
INTRAVENOUS | Status: DC | PRN
  Administered 2021-07-02: 200 mg via INTRAVENOUS

## 2021-07-02 MED ORDER — PHENYLEPHRINE 40 MCG/ML (10ML) SYRINGE FOR IV PUSH (FOR BLOOD PRESSURE SUPPORT)
PREFILLED_SYRINGE | INTRAVENOUS | Status: DC | PRN
  Administered 2021-07-02: 80 ug via INTRAVENOUS
  Administered 2021-07-02: 40 ug via INTRAVENOUS
  Administered 2021-07-02: 120 ug via INTRAVENOUS
  Administered 2021-07-02: 40 ug via INTRAVENOUS
  Administered 2021-07-02: 120 ug via INTRAVENOUS

## 2021-07-02 MED ORDER — LACTATED RINGERS IV SOLN: INTRAVENOUS | Status: DC | PRN

## 2021-07-02 MED ORDER — CHLORHEXIDINE GLUCONATE 0.12 % MT SOLN
OROMUCOSAL | Status: AC
  Administered 2021-07-02: 15 mL
  Filled 2021-07-02: qty 15

## 2021-07-02 SURGICAL SUPPLY — 30 items
BRUSH CYTOL CELLEBRITY 1.5X140 (MISCELLANEOUS) IMPLANT
CANISTER SUCT 3000ML PPV (MISCELLANEOUS) ×4 IMPLANT
CONT SPEC 4OZ CLIKSEAL STRL BL (MISCELLANEOUS) ×4 IMPLANT
COVER BACK TABLE 60X90IN (DRAPES) ×4 IMPLANT
COVER DOME SNAP 22 D (MISCELLANEOUS) ×4 IMPLANT
FORCEPS BIOP RJ4 1.8 (CUTTING FORCEPS) IMPLANT
GAUZE SPONGE 4X4 12PLY STRL (GAUZE/BANDAGES/DRESSINGS) ×4 IMPLANT
GLOVE BIO SURGEON STRL SZ7.5 (GLOVE) ×4 IMPLANT
GOWN STRL REUS W/ TWL LRG LVL3 (GOWN DISPOSABLE) ×3 IMPLANT
GOWN STRL REUS W/TWL LRG LVL3 (GOWN DISPOSABLE) ×1
KIT CLEAN ENDO COMPLIANCE (KITS) ×8 IMPLANT
KIT TURNOVER KIT B (KITS) ×4 IMPLANT
MARKER SKIN DUAL TIP RULER LAB (MISCELLANEOUS) ×4 IMPLANT
NDL EBUS SONO TIP PENTAX (NEEDLE) ×3 IMPLANT
NEEDLE EBUS SONO TIP PENTAX (NEEDLE) ×4 IMPLANT
NS IRRIG 1000ML POUR BTL (IV SOLUTION) ×4 IMPLANT
OIL SILICONE PENTAX (PARTS (SERVICE/REPAIRS)) ×4 IMPLANT
PAD ARMBOARD 7.5X6 YLW CONV (MISCELLANEOUS) ×8 IMPLANT
SOL ANTI FOG 6CC (MISCELLANEOUS) ×3 IMPLANT
SOLUTION ANTI FOG 6CC (MISCELLANEOUS) ×1
SYR 20CC LL (SYRINGE) ×8 IMPLANT
SYR 20ML ECCENTRIC (SYRINGE) ×8 IMPLANT
SYR 50ML SLIP (SYRINGE) IMPLANT
SYR 5ML LUER SLIP (SYRINGE) ×4 IMPLANT
TOWEL OR 17X24 6PK STRL BLUE (TOWEL DISPOSABLE) ×4 IMPLANT
TRAP SPECIMEN MUCOUS 40CC (MISCELLANEOUS) IMPLANT
TUBE CONNECTING 20X1/4 (TUBING) ×8 IMPLANT
UNDERPAD 30X30 (UNDERPADS AND DIAPERS) ×4 IMPLANT
VALVE DISPOSABLE (MISCELLANEOUS) ×4 IMPLANT
WATER STERILE IRR 1000ML POUR (IV SOLUTION) ×4 IMPLANT

## 2021-07-02 NOTE — Discharge Instructions (Signed)
Flexible Bronchoscopy, Care After ?This sheet gives you information about how to care for yourself after your test. Your doctor may also give you more specific instructions. If you have problems or questions, contact your doctor. ?Follow these instructions at home: ?Eating and drinking ?Do not eat or drink anything (not even water) for 2 hours after your test, or until your numbing medicine (local anesthetic) wears off. ?When your numbness is gone and your cough and gag reflexes have come back, you may: ?Eat only soft foods. ?Slowly drink liquids. ?The day after the test, go back to your normal diet. ?Driving ?Do not drive for 24 hours if you were given a medicine to help you relax (sedative). ?Do not drive or use heavy machinery while taking prescription pain medicine. ?General instructions ? ?Take over-the-counter and prescription medicines only as told by your doctor. ?Return to your normal activities as told. Ask what activities are safe for you. ?Do not use any products that have nicotine or tobacco in them. This includes cigarettes and e-cigarettes. If you need help quitting, ask your doctor. ?Keep all follow-up visits as told by your doctor. This is important. It is very important if you had a tissue sample (biopsy) taken. ?Get help right away if: ?You have shortness of breath that gets worse. ?You get light-headed. ?You feel like you are going to pass out (faint). ?You have chest pain. ?You cough up: ?More than a little blood. ?More blood than before. ?Summary ?Do not eat or drink anything (not even water) for 2 hours after your test, or until your numbing medicine wears off. ?Do not use cigarettes. Do not use e-cigarettes. ?Get help right away if you have chest pain. ? ?This information is not intended to replace advice given to you by your health care provider. Make sure you discuss any questions you have with your health care provider. ?Document Released: 01/19/2009 Document Revised: 03/06/2017 Document  Reviewed: 04/11/2016 ?Elsevier Patient Education ? [place]. ? ?

## 2021-07-02 NOTE — Transfer of Care (Signed)
Immediate Anesthesia Transfer of Care Note ? ?Patient: Edward Beck ? ?Procedure(s) Performed: ROBOTIC ASSISTED NAVIGATIONAL BRONCHOSCOPY (Left) ?VIDEO BRONCHOSCOPY WITH ENDOBRONCHIAL ULTRASOUND (Bilateral) ?BRONCHIAL NEEDLE ASPIRATION BIOPSIES ?BRONCHIAL BIOPSIES ?BRONCHIAL BRUSHINGS ? ?Patient Location: PACU ? ?Anesthesia Type:General ? ?Level of Consciousness: awake, drowsy, patient cooperative and responds to stimulation ? ?Airway & Oxygen Therapy: Patient Spontanous Breathing and Patient connected to face mask oxygen ? ?Post-op Assessment: Report given to RN, Post -op Vital signs reviewed and stable and Patient moving all extremities X 4 ? ?Post vital signs: Reviewed and stable ? ?Last Vitals:  ?Vitals Value Taken Time  ?BP    ?Temp    ?Pulse 92 07/02/21 1021  ?Resp 21 07/02/21 1021  ?SpO2 93 % 07/02/21 1021  ?Vitals shown include unvalidated device data. ? ?Last Pain:  ?Vitals:  ? 07/02/21 0704  ?TempSrc:   ?PainSc: 0-No pain  ?   ? ?  ? ?Complications: No notable events documented. ?

## 2021-07-02 NOTE — Progress Notes (Signed)
Patient stated that he was positive for COVID at the end of December.  Dates fall within the 90 day window, do not need to retest, per protocol. ?

## 2021-07-02 NOTE — Anesthesia Procedure Notes (Signed)
Procedure Name: Intubation ?Date/Time: 07/02/2021 9:25 AM ?Performed by: Cathren Harsh, CRNA ?Pre-anesthesia Checklist: Patient identified, Emergency Drugs available, Suction available and Patient being monitored ?Patient Re-evaluated:Patient Re-evaluated prior to induction ?Oxygen Delivery Method: Circle System Utilized ?Preoxygenation: Pre-oxygenation with 100% oxygen ?Induction Type: IV induction ?Ventilation: Mask ventilation without difficulty ?Laryngoscope Size: Glidescope and 4 ?Grade View: Grade I ?Tube type: Oral ?Tube size: 8.5 mm ?Number of attempts: 1 ?Airway Equipment and Method: Stylet and Oral airway ?Placement Confirmation: ETT inserted through vocal cords under direct vision, positive ETCO2 and breath sounds checked- equal and bilateral ?Secured at: 24 cm ?Tube secured with: Tape ?Dental Injury: Teeth and Oropharynx as per pre-operative assessment  ? ? ? ? ?

## 2021-07-02 NOTE — Interval H&P Note (Signed)
History and Physical Interval Note: ? ?07/02/2021 ?9:06 AM ? ?Edward Beck  has presented today for surgery, with the diagnosis of lung mass, adenopathy.  The various methods of treatment have been discussed with the patient and family. After consideration of risks, benefits and other options for treatment, the patient has consented to  Procedure(s): ?ROBOTIC ASSISTED NAVIGATIONAL BRONCHOSCOPY (Left) ?VIDEO BRONCHOSCOPY WITH ENDOBRONCHIAL ULTRASOUND (Bilateral) as a surgical intervention.  The patient's history has been reviewed, patient examined, no change in status, stable for surgery.  I have reviewed the patient's chart and labs.  Questions were answered to the patient's satisfaction.   ? ? ?Octavio Graves Shawna Kiener ? ? ?

## 2021-07-02 NOTE — Op Note (Signed)
Video Bronchoscopy with Robotic Assisted Bronchoscopic Navigation  ? ?Date of Operation: 07/02/2021  ? ?Pre-op Diagnosis: Left lower lobe superior segment mass ? ?Post-op Diagnosis: Left lower lobe superior segment mass ? ?Surgeon: Garner Nash, DO  ? ?Assistants: None  ? ?Anesthesia: General endotracheal anesthesia ? ?Operation: Flexible video fiberoptic bronchoscopy with robotic assistance and biopsies. ? ?Estimated Blood Loss: Minimal ? ?Complications: None ? ?Indications and History: ?Edward Beck is a 76 y.o. male with history of left lower lobe superior segment mass. The risks, benefits, complications, treatment options and expected outcomes were discussed with the patient.  The possibilities of pneumothorax, pneumonia, reaction to medication, pulmonary aspiration, perforation of a viscus, bleeding, failure to diagnose a condition and creating a complication requiring transfusion or operation were discussed with the patient who freely signed the consent.   ? ?Description of Procedure: ?The patient was seen in the Preoperative Area, was examined and was deemed appropriate to proceed.  The patient was taken to [facility] endoscopy room 3, identified as Edward Beck and the procedure verified as Flexible Video Fiberoptic Bronchoscopy.  A Time Out was held and the above information confirmed.  ? ?Prior to the date of the procedure a high-resolution CT scan of the chest was performed. Utilizing ION software program a virtual tracheobronchial tree was generated to allow the creation of distinct navigation pathways to the patient's parenchymal abnormalities. After being taken to the operating room general anesthesia was initiated and the patient  was orally intubated. The video fiberoptic bronchoscope was introduced via the endotracheal tube and a general inspection was performed which showed normal right and left lung anatomy, aspiration of the bilateral mainstems was completed to remove any remaining secretions.  Robotic catheter inserted into patient's endotracheal tube.  ? ?Target #1 left lower lobe superior segment: ?The distinct navigation pathways prepared prior to this procedure were then utilized to navigate to patient's lesion identified on CT scan. The robotic catheter was secured into place and the vision probe was withdrawn.  Lesion location was approximated using fluoroscopy and radial endobronchial ultrasound for peripheral targeting. Under fluoroscopic guidance transbronchial needle brushings, transbronchial needle biopsies, and transbronchial forceps biopsies were performed to be sent for cytology and pathology.  Following biopsies of the left lower lobe superior segment mass the standard therapeutic bronchoscope was exchanged for the Olympus endobronchial ultrasound.  We reviewed to the left hilum there was a small nodal structure visible also within the subcarinal space.  Felt unnecessary to sample as current superior segmental mass has bony involvement.  And diagnostic tissue was obtained in room from the left lower lobe superior segment lesion. ? ?At the end of the procedure a general airway inspection was performed and there was no evidence of active bleeding. The bronchoscope was removed.  The patient tolerated the procedure well. There was no significant blood loss and there were no obvious complications. A post-procedural chest x-ray is pending. ? ?Samples Target #1: ?1. Transbronchial needle brushings from left lower lobe superior segment ?2. Transbronchial Wang needle biopsies from left lower lobe superior segment ?3. Transbronchial forceps biopsies from left lower lobe superior segment ? ?Plans:  ?The patient will be discharged from the PACU to home when recovered from anesthesia and after chest x-ray is reviewed. We will review the cytology, pathology results with the patient when they become available. Outpatient followup will be with Octavio Graves Karim Aiello, DO.  ? ?Garner Nash, DO ?[facility] ?07/02/2021 10:10 AM   ? ?

## 2021-07-03 ENCOUNTER — Ambulatory Visit (HOSPITAL_BASED_OUTPATIENT_CLINIC_OR_DEPARTMENT_OTHER): Payer: Medicare Other

## 2021-07-03 ENCOUNTER — Encounter (HOSPITAL_COMMUNITY): Payer: Self-pay | Admitting: Pulmonary Disease

## 2021-07-03 NOTE — Anesthesia Postprocedure Evaluation (Signed)
Anesthesia Post Note ? ?Patient: Edward Beck ? ?Procedure(s) Performed: ROBOTIC ASSISTED NAVIGATIONAL BRONCHOSCOPY (Left) ?VIDEO BRONCHOSCOPY WITH ENDOBRONCHIAL ULTRASOUND (Bilateral) ?BRONCHIAL NEEDLE ASPIRATION BIOPSIES ?BRONCHIAL BIOPSIES ?BRONCHIAL BRUSHINGS ? ?  ? ?Patient location during evaluation: PACU ?Anesthesia Type: General ?Level of consciousness: sedated and patient cooperative ?Pain management: pain level controlled ?Vital Signs Assessment: post-procedure vital signs reviewed and stable ?Respiratory status: spontaneous breathing ?Cardiovascular status: stable ?Anesthetic complications: no ? ? ?No notable events documented. ? ?Last Vitals:  ?Vitals:  ? 07/02/21 1050 07/02/21 1105  ?BP: 122/63 111/67  ?Pulse: 78 75  ?Resp: 20 14  ?Temp: 36.4 ?C   ?SpO2: 92% 92%  ?  ?Last Pain:  ?Vitals:  ? 07/02/21 1050  ?TempSrc:   ?PainSc: 0-No pain  ? ? ?  ?  ?  ?  ?  ?  ? ?Nolon Nations ? ? ? ? ?

## 2021-07-05 LAB — CYTOLOGY - NON PAP

## 2021-07-09 DIAGNOSIS — C3490 Malignant neoplasm of unspecified part of unspecified bronchus or lung: Secondary | ICD-10-CM | POA: Diagnosis not present

## 2021-07-10 ENCOUNTER — Encounter: Payer: Self-pay | Admitting: Pulmonary Disease

## 2021-07-10 ENCOUNTER — Telehealth: Payer: Self-pay | Admitting: Internal Medicine

## 2021-07-10 ENCOUNTER — Ambulatory Visit: Payer: Medicare Other | Admitting: Acute Care

## 2021-07-10 ENCOUNTER — Ambulatory Visit (INDEPENDENT_AMBULATORY_CARE_PROVIDER_SITE_OTHER): Payer: Medicare Other | Admitting: Pulmonary Disease

## 2021-07-10 VITALS — BP 128/70 | HR 73 | Temp 97.8°F | Ht 74.0 in | Wt 168.0 lb

## 2021-07-10 DIAGNOSIS — M899 Disorder of bone, unspecified: Secondary | ICD-10-CM | POA: Diagnosis not present

## 2021-07-10 DIAGNOSIS — R599 Enlarged lymph nodes, unspecified: Secondary | ICD-10-CM

## 2021-07-10 DIAGNOSIS — F17200 Nicotine dependence, unspecified, uncomplicated: Secondary | ICD-10-CM | POA: Diagnosis not present

## 2021-07-10 DIAGNOSIS — C3492 Malignant neoplasm of unspecified part of left bronchus or lung: Secondary | ICD-10-CM | POA: Diagnosis not present

## 2021-07-10 MED ORDER — HYDROCODONE-ACETAMINOPHEN 10-325 MG PO TABS: 1.0000 | ORAL_TABLET | Freq: Four times a day (QID) | ORAL | 0 refills | Status: DC | PRN

## 2021-07-10 MED ORDER — ANORO ELLIPTA 62.5-25 MCG/ACT IN AEPB: 1.0000 | INHALATION_SPRAY | Freq: Every day | RESPIRATORY_TRACT | 0 refills | Status: DC

## 2021-07-10 NOTE — Telephone Encounter (Signed)
Scheduled appt per 4/5 referral. Pt is aware of appt date and time. Pt is aware to arrive 15 mins prior to appt time and to bring and updated insurance card. Pt is aware of appt location.   ?

## 2021-07-10 NOTE — Addendum Note (Signed)
Addended by: Rosana Berger on: 07/10/2021 12:07 PM ? ? Modules accepted: Orders ? ?

## 2021-07-10 NOTE — Patient Instructions (Addendum)
Thank you for visiting Dr. Valeta Harms at [facility]. ?Today we recommend the following: ? ?Anoro or Stiolto samples?  ? ?Orders Placed This Encounter  ?Procedures  ? Ambulatory referral to Hematology / Oncology  ? Ambulatory referral to Radiation Oncology  ? ?Meds ordered this encounter  ?Medications  ? HYDROcodone-acetaminophen (NORCO) 10-325 MG tablet  ?  Sig: Take 1-2 tablets by mouth every 6 (six) hours as needed.  ?  Dispense:  45 tablet  ?  Refill:  0  ? ?Return in about 6 months (around 01/09/2022) for with APP or Dr. Valeta Harms. ? ? ? ?Please do your part to reduce the spread of COVID-19.  ? ?

## 2021-07-10 NOTE — Progress Notes (Signed)
? ?Synopsis: Referred in March 2023 for lung mass by [name], * ? ?Subjective:  ? ?PATIENT ID: Edward Beck GENDER: male DOB: Jan 09, 1946, MRN: [id number] ? ?Chief Complaint  ?Patient presents with  ? Follow-up  ?  Discuss results of bronch. He is having to take his vicodin every 6 hours due to CP.   ? ? ?This is a 76 year old gentleman, Longstanding history of tobacco use, current every day smoker.  Patient had pneumonia was treated with antibiotics continued to have chest pain.  Subsequent chest x-ray revealed concern for bony involvement of the lesion inIn the posterior left upper lobe.  Patient now also has a small amount of pleural fluid and pleural thickening.  Patient underwent nuclear medicine PET scan imaging with a hypermetabolic uptake within the lesion in the left upper lobe with associated hilar adenopathy and pleural involvement.  There is a small amount of fluid within his chest to.  Concern for an advanced age bronchogenic carcinoma the patient was referred here for consideration for tissue biopsy.  Left upper lobe hypermetabolic lung lesion with associated hilar adenopathy concerning for an advanced age bronchogenic carcinoma.  He also has a small ? ?OV 07/10/2021: Here today for follow-up after recent bronchoscopy.Patient was taken for bronchoscopy on 07/02/2021.  Pathology from biopsy was consistent with non-small cell lung cancer, consistent with squamous cell carcinoma.  Patient unfortunately still experiencing pain with deep inspiration on the left side.  He has been using his pain medications which helps. ? ? ?Past Medical History:  ?Diagnosis Date  ? Condyloma acuminatum of scrotum   ? COVID 2021  ? December 2022 - mild  ? Pneumonia   ? Scrotal lesion   ?  ? ?No family history on file.  ? ?Past Surgical History:  ?Procedure Laterality Date  ? BRONCHIAL BIOPSY  07/02/2021  ? Procedure: BRONCHIAL BIOPSIES;  Surgeon: Garner Nash, DO;  Location: [facility];  Service:  Pulmonary;;  ? BRONCHIAL BRUSHINGS  07/02/2021  ? Procedure: BRONCHIAL BRUSHINGS;  Surgeon: Garner Nash, DO;  Location: [place];  Service: Pulmonary;;  ? BRONCHIAL NEEDLE ASPIRATION BIOPSY  07/02/2021  ? Procedure: BRONCHIAL NEEDLE ASPIRATION BIOPSIES;  Surgeon: Garner Nash, DO;  Location: [place] [facility];  Service: Pulmonary;;  ? HERNIA REPAIR  2009  ? left inguinal  ? SCROTAL EXPLORATION  11/21/2011  ? Procedure: SCROTUM EXPLORATION;  Surgeon: Hanley Ben, MD;  Location: [facility];  Service: Urology;  Laterality: N/A;  EXCISION, BIOPSY SCROTAL CONDYLOMA ?  ? VIDEO BRONCHOSCOPY WITH ENDOBRONCHIAL ULTRASOUND Bilateral 07/02/2021  ? Procedure: VIDEO BRONCHOSCOPY WITH ENDOBRONCHIAL ULTRASOUND;  Surgeon: Garner Nash, DO;  Location: [place];  Service: Pulmonary;  Laterality: Bilateral;  ? ? ?Social History  ? ?Socioeconomic History  ? Marital status: Married  ?  Spouse name: Not on file  ? Number of children: Not on file  ? Years of education: Not on file  ? Highest education level: Not on file  ?Occupational History  ? Not on file  ?Tobacco Use  ? Smoking status: Every Day  ?  Packs/day: 1.00  ?  Years: 50.00  ?  Pack years: 50.00  ?  Types: Cigarettes  ? Smokeless tobacco: Not on file  ?Vaping Use  ? Vaping Use: Former  ?Substance and Sexual Activity  ? Alcohol use: Yes  ?  Alcohol/week: 1.0 standard drink  ?  Types: 1 Cans of beer per week  ?  Comment: occasionally  ? Drug use: Never  ?  Sexual activity: Not on file  ?Other Topics Concern  ? Not on file  ?Social History Narrative  ? Not on file  ? ?Social Determinants of Health  ? ?Financial Resource Strain: Not on file  ?Food Insecurity: Not on file  ?Transportation Needs: Not on file  ?Physical Activity: Not on file  ?Stress: Not on file  ?Social Connections: Not on file  ?Intimate Partner Violence: Not on file  ?  ? ?Allergies  ?Allergen Reactions  ? Tamiflu [Oseltamivir Phosphate] Rash  ?  ? ?Outpatient Medications Prior to Visit  ?Medication  Sig Dispense Refill  ? finasteride (PROSCAR) 5 MG tablet Take 1 tablet (5 mg total) by mouth daily. 90 tablet 3  ? HYDROcodone-acetaminophen (NORCO/VICODIN) 5-325 MG tablet Take 1 tablet by mouth every 6 (six) hours as needed.    ? naproxen sodium (ALEVE) 220 MG tablet Take 220 mg by mouth 2 (two) times daily as needed (pain).    ? triamcinolone cream (KENALOG) 0.1 % Apply 1 application. topically 2 (two) times daily as needed (dry skin).    ? ?No facility-administered medications prior to visit.  ? ? ?Review of Systems  ?Constitutional:  Negative for chills, fever, malaise/fatigue and weight loss.  ?HENT:  Negative for hearing loss, sore throat and tinnitus.   ?Eyes:  Negative for blurred vision and double vision.  ?Respiratory:  Positive for shortness of breath. Negative for cough, hemoptysis, sputum production, wheezing and stridor.   ?Cardiovascular:  Positive for chest pain. Negative for palpitations, orthopnea, leg swelling and PND.  ?Gastrointestinal:  Negative for abdominal pain, constipation, diarrhea, heartburn, nausea and vomiting.  ?Genitourinary:  Negative for dysuria, hematuria and urgency.  ?Musculoskeletal:  Negative for joint pain and myalgias.  ?Skin:  Negative for itching and rash.  ?Neurological:  Negative for dizziness, tingling, weakness and headaches.  ?Endo/Heme/Allergies:  Negative for environmental allergies. Does not bruise/bleed easily.  ?Psychiatric/Behavioral:  Negative for depression. The patient is not nervous/anxious and does not have insomnia.   ?All other systems reviewed and are negative. ? ? ?Objective:  ?Physical Exam ?Vitals reviewed.  ?Constitutional:   ?   General: He is not in acute distress. ?   Appearance: He is well-developed.  ?HENT:  ?   Head: Normocephalic and atraumatic.  ?Eyes:  ?   General: No scleral icterus. ?   Conjunctiva/sclera: Conjunctivae normal.  ?   Pupils: Pupils are equal, round, and reactive to light.  ?Neck:  ?   Vascular: No JVD.  ?   Trachea: No  tracheal deviation.  ?Cardiovascular:  ?   Rate and Rhythm: Normal rate and regular rhythm.  ?   Heart sounds: Normal heart sounds. No murmur heard. ?Pulmonary:  ?   Effort: Pulmonary effort is normal. No tachypnea, accessory muscle usage or respiratory distress.  ?   Breath sounds: No stridor. No wheezing, rhonchi or rales.  ?Abdominal:  ?   General: Bowel sounds are normal. There is no distension.  ?   Palpations: Abdomen is soft.  ?   Tenderness: There is no abdominal tenderness.  ?Musculoskeletal:     ?   General: No tenderness.  ?   Cervical back: Neck supple.  ?Lymphadenopathy:  ?   Cervical: No cervical adenopathy.  ?Skin: ?   General: Skin is warm and dry.  ?   Capillary Refill: Capillary refill takes less than 2 seconds.  ?   Findings: No rash.  ?Neurological:  ?   Mental Status: He is alert and oriented to  person, place, and time.  ?Psychiatric:     ?   Behavior: Behavior normal.  ? ? ? ?Vitals:  ? 07/10/21 1143  ?BP: 128/70  ?Pulse: 73  ?Temp: 97.8 ?F (36.6 ?C)  ?TempSrc: Oral  ?SpO2: 98%  ?Weight: 168 lb (76.2 kg)  ?Height: 6\' 2"  (1.88 m)  ? ? ?98% on RA ?BMI Readings from Last 3 Encounters:  ?07/10/21 21.57 kg/m?  ?07/02/21 22.08 kg/m?  ?07/01/21 22.75 kg/m?  ? ?Wt Readings from Last 3 Encounters:  ?07/10/21 168 lb (76.2 kg)  ?07/02/21 172 lb (78 kg)  ?07/01/21 172 lb 6.4 oz (78.2 kg)  ? ? ? ?CBC ?   ?Component Value Date/Time  ? WBC 6.6 11/20/2011 1000  ? RBC 5.29 11/20/2011 1000  ? HGB 17.0 11/20/2011 1000  ? HCT 48.7 11/20/2011 1000  ? PLT 261 11/20/2011 1000  ? MCV 92.1 11/20/2011 1000  ? MCH 32.1 11/20/2011 1000  ? MCHC 34.9 11/20/2011 1000  ? RDW 12.4 11/20/2011 1000  ? ? ? ?Chest Imaging: ?06/07/2021 nuclear medicine pet imaging: ?Hypermetabolic paravertebral mass with fifth sixth rib invasion. ?Concerning for advanced age bronchogenic carcinoma. ?The patient's images have been independently reviewed by me.   ? ?Pulmonary Functions Testing Results: ?   ? View : No data to display.  ?  ?  ?   ? ? ?FeNO:  ? ?Pathology:  ? ?Echocardiogram:  ? ?Heart Catheterization:  ?   ?Assessment & Plan:  ? ?  ICD-10-CM   ?1. NSCLC of left lung [facility])  C34.92 Ambulatory referral to Hematology / Oncology  ?  Ambulatory referral t

## 2021-07-11 ENCOUNTER — Other Ambulatory Visit: Payer: Self-pay | Admitting: *Deleted

## 2021-07-11 ENCOUNTER — Other Ambulatory Visit: Payer: Self-pay

## 2021-07-11 ENCOUNTER — Encounter: Payer: Self-pay | Admitting: *Deleted

## 2021-07-11 ENCOUNTER — Inpatient Hospital Stay (HOSPITAL_BASED_OUTPATIENT_CLINIC_OR_DEPARTMENT_OTHER): Payer: Medicare Other | Admitting: Internal Medicine

## 2021-07-11 ENCOUNTER — Encounter: Payer: Self-pay | Admitting: Internal Medicine

## 2021-07-11 ENCOUNTER — Ambulatory Visit (HOSPITAL_COMMUNITY)
Admission: RE | Admit: 2021-07-11 | Discharge: 2021-07-11 | Disposition: A | Discharge status: Home / Self Care | Payer: Medicare Other | Source: Ambulatory Visit | Attending: Acute Care | Admitting: Acute Care

## 2021-07-11 VITALS — BP 138/78 | HR 88 | Temp 97.8°F | Resp 17 | Ht 74.0 in | Wt 169.8 lb

## 2021-07-11 DIAGNOSIS — Z5112 Encounter for antineoplastic immunotherapy: Secondary | ICD-10-CM | POA: Insufficient documentation

## 2021-07-11 DIAGNOSIS — Z720 Tobacco use: Secondary | ICD-10-CM

## 2021-07-11 DIAGNOSIS — C3492 Malignant neoplasm of unspecified part of left bronchus or lung: Secondary | ICD-10-CM | POA: Insufficient documentation

## 2021-07-11 DIAGNOSIS — R918 Other nonspecific abnormal finding of lung field: Secondary | ICD-10-CM | POA: Insufficient documentation

## 2021-07-11 DIAGNOSIS — G9389 Other specified disorders of brain: Secondary | ICD-10-CM | POA: Diagnosis not present

## 2021-07-11 DIAGNOSIS — R634 Abnormal weight loss: Secondary | ICD-10-CM

## 2021-07-11 DIAGNOSIS — Z5111 Encounter for antineoplastic chemotherapy: Secondary | ICD-10-CM | POA: Insufficient documentation

## 2021-07-11 DIAGNOSIS — Z808 Family history of malignant neoplasm of other organs or systems: Secondary | ICD-10-CM

## 2021-07-11 DIAGNOSIS — C3432 Malignant neoplasm of lower lobe, left bronchus or lung: Secondary | ICD-10-CM | POA: Insufficient documentation

## 2021-07-11 DIAGNOSIS — C7951 Secondary malignant neoplasm of bone: Secondary | ICD-10-CM | POA: Insufficient documentation

## 2021-07-11 DIAGNOSIS — C7989 Secondary malignant neoplasm of other specified sites: Secondary | ICD-10-CM | POA: Insufficient documentation

## 2021-07-11 DIAGNOSIS — G319 Degenerative disease of nervous system, unspecified: Secondary | ICD-10-CM | POA: Diagnosis not present

## 2021-07-11 DIAGNOSIS — Z5189 Encounter for other specified aftercare: Secondary | ICD-10-CM | POA: Insufficient documentation

## 2021-07-11 DIAGNOSIS — C782 Secondary malignant neoplasm of pleura: Secondary | ICD-10-CM | POA: Insufficient documentation

## 2021-07-11 DIAGNOSIS — Z8616 Personal history of COVID-19: Secondary | ICD-10-CM

## 2021-07-11 DIAGNOSIS — Z79899 Other long term (current) drug therapy: Secondary | ICD-10-CM | POA: Insufficient documentation

## 2021-07-11 MED ORDER — PROCHLORPERAZINE MALEATE 10 MG PO TABS: 10.0000 mg | ORAL_TABLET | Freq: Four times a day (QID) | ORAL | 0 refills | Status: DC | PRN

## 2021-07-11 MED ORDER — LIDOCAINE-PRILOCAINE 2.5-2.5 % EX CREA: TOPICAL_CREAM | CUTANEOUS | 0 refills | Status: DC

## 2021-07-11 MED ORDER — GADOBUTROL 1 MMOL/ML IV SOLN
7.5000 mL | Freq: Once | INTRAVENOUS | Status: AC | PRN
  Administered 2021-07-11: 7.5 mL via INTRAVENOUS

## 2021-07-11 NOTE — Progress Notes (Signed)
Oncology Nurse Navigator Documentation ? ? ?  07/11/2021  ? 10:00 AM  ?Oncology Nurse Navigator Flowsheets  ?Abnormal Finding Date 06/07/2021  ?Confirmed Diagnosis Date 07/02/2021  ?Diagnosis Status Pending Molecular Studies  ?Navigator Follow Up Date: 07/15/2021  ?Navigator Follow Up Reason: Appointment Review  ?Navigator Location [facility]  ?Referral Date to RadOnc/MedOnc 07/10/2021  ?Navigator Encounter Type Appt/Treatment Plan Review  ?Patient Visit Type MedOnc;Initial  ?Treatment Phase Pre-Tx/Tx Discussion  ?Barriers/Navigation Needs Coordination of Care/per Dr. Julien Nordmann, I will notify pathology to send tissue for recent bx for PDL 1 testing. Cancer conference discussion documented in TB flow sheet.   ?Interventions Coordination of Care  ?Acuity Level 2-Minimal Needs (1-2 Barriers Identified)  ?Coordination of Care Pathology  ?Time Spent with Patient 30  ?  ?

## 2021-07-11 NOTE — Progress Notes (Signed)
Location of tumor and Histology per Pathology Report: LLL lung ? ?Biopsy:  ?The patient underwent flexible video fiberoptic bronchoscopy with robotic assistance and biopsies under the care of Dr. Valeta Harms on July 02, 2021 and the final pathology ([id number]) non-small cell carcinoma favoring squamous cell carcinoma. ? ?A. LUNG, LLL, NEEDLE ASP  BX FORCEPS:  ?- Malignant cells present  ?- Non-small cell carcinoma, favor squamous carcinoma (see comment)  ?B. LUNG, LLL, BRUSHING:  ?- Malignant cells present  ?- Non-small cell carcinoma, favor squamous carcinoma  ? ?Past/Anticipated interventions by surgeon, if any:   ?Surgeon: Garner Nash, DO  ?Operation: Flexible video fiberoptic bronchoscopy with robotic assistance and biopsies. ? ?Past/Anticipated interventions by medical oncology, if any: Dr Julien Nordmann ?carboplatin for AUC of 5, paclitaxel 175 Mg/M2 and Libtayo (Cempilimab) 350 Mg IV every 3 weeks with Neulasta support. ? ?He will need to be set up with radiation oncology for palliative treatments to the rib involvement to help with pain  ? ? ?Sites of Visceral and Bony Metastatic Disease: superior segment left lower lobe lung mass with invasion of the chest wall and destruction of the posterior left sixth and seventh ribs as well as left hilar adenopathy and suspicious pleural metastasis diagnosed in March 2023 ? ?Location(s) of Symptomatic Metastases: posterior left sixth and seventh ribs  ? ?Pain on a scale of 0-10 is: 3 when taking hydrocodone, 8 when pain medication wears off  ? ?Ambulatory status? Walker? Wheelchair?: Ambulatory ? ?SAFETY ISSUES: ?Prior radiation? no ?Pacemaker/ICD? no ?Possible current pregnancy? no ?Is the patient on methotrexate? no ? ?Current Complaints / other details:  productive cough with white-yellow phlegm, denies hemoptysis, denies shortness of breath ? ?Vitals:  ? 07/15/21 0817  ?BP: 138/84  ?Pulse: 81  ?Resp: 20  ?Temp: 97.7 ?F (36.5 ?C)  ?SpO2: 99%  ?Weight: 168 lb 6.4 oz  (76.4 kg)  ?Height: 6\' 2"  (1.88 m)  ? ? ? ?

## 2021-07-11 NOTE — Progress Notes (Signed)
Oncology Nurse Navigator Documentation ? ? ?  07/11/2021  ? 12:00 PM 07/11/2021  ? 10:00 AM  ?Oncology Nurse Navigator Flowsheets  ?Abnormal Finding Date  06/07/2021  ?Confirmed Diagnosis Date  07/02/2021  ?Diagnosis Status  Pending Molecular Studies  ?Navigator Follow Up Date:  07/15/2021  ?Navigator Follow Up Reason:  Appointment Review  ?Navigator Location [facility]  ?Referral Date to RadOnc/MedOnc  07/10/2021  ?Navigator Encounter Type  Appt/Treatment Plan Review  ?Patient Visit Type Other MedOnc;Initial  ?Treatment Phase Pre-Tx/Tx Discussion Pre-Tx/Tx Discussion  ?Barriers/Navigation Needs Coordination of Care/I received a message from pathology dept that patient tissue went for PDL 1 testing to [place] on 07/09/2021.   Coordination of Care  ?Interventions Coordination of Care Coordination of Care  ?Acuity Level 2-Minimal Needs (1-2 Barriers Identified) Level 2-Minimal Needs (1-2 Barriers Identified)  ?Coordination of Care Pathology Pathology  ?Time Spent with Patient 30 30  ?  ?

## 2021-07-11 NOTE — Progress Notes (Signed)
The proposed treatment discussed in cancer conference is for discussion purpose only and is not a binding recommendation. The patient was not physically examined nor present for their treatment options. Therefore, final treatment plans cannot be decided.  ?

## 2021-07-11 NOTE — Patient Instructions (Signed)
Steps to Quit Smoking Smoking tobacco is the leading cause of preventable death. It can affect almost every organ in the body. Smoking puts you and people around you at risk for many serious, long-lasting (chronic) diseases. Quitting smoking can be hard, but it is one of the best things that you can do for your health. It is never too late to quit. How do I get ready to quit? When you decide to quit smoking, make a plan to help you succeed. Before you quit: Pick a date to quit. Set a date within the next 2 weeks to give you time to prepare. Write down the reasons why you are quitting. Keep this list in places where you will see it often. Tell your family, friends, and co-workers that you are quitting. Their support is important. Talk with your doctor about the choices that may help you quit. Find out if your health insurance will pay for these treatments. Know the people, places, things, and activities that make you want to smoke (triggers). Avoid them. What first steps can I take to quit smoking? Throw away all cigarettes at home, at work, and in your car. Throw away the things that you use when you smoke, such as ashtrays and lighters. Clean your car. Make sure to empty the ashtray. Clean your home, including curtains and carpets. What can I do to help me quit smoking? Talk with your doctor about taking medicines and seeing a counselor at the same time. You are more likely to succeed when you do both. If you are pregnant or breastfeeding, talk with your doctor about counseling or other ways to quit smoking. Do not take medicine to help you quit smoking unless your doctor tells you to do so. To quit smoking: Quit right away Quit smoking totally, instead of slowly cutting back on how much you smoke over a period of time. Go to counseling. You are more likely to quit if you go to counseling sessions regularly. Take medicine You may take medicines to help you quit. Some medicines need a  prescription, and some you can buy over-the-counter. Some medicines may contain a drug called nicotine to replace the nicotine in cigarettes. Medicines may: Help you to stop having the desire to smoke (cravings). Help to stop the problems that come when you stop smoking (withdrawal symptoms). Your doctor may ask you to use: Nicotine patches, gum, or lozenges. Nicotine inhalers or sprays. Non-nicotine medicine that is taken by mouth. Find resources Find resources and other ways to help you quit smoking and remain smoke-free after you quit. These resources are most helpful when you use them often. They include: Online chats with a counselor. Phone quitlines. Printed self-help materials. Support groups or group counseling. Text messaging programs. Mobile phone apps. Use apps on your mobile phone or tablet that can help you stick to your quit plan. There are many free apps for mobile phones and tablets as well as websites. Examples include Quit Guide from the CDC and smokefree.gov  What things can I do to make it easier to quit?  Talk to your family and friends. Ask them to support and encourage you. Call a phone quitline (1-800-QUIT-NOW), reach out to support groups, or work with a counselor. Ask people who smoke to not smoke around you. Avoid places that make you want to smoke, such as: Bars. Parties. Smoke-break areas at work. Spend time with people who do not smoke. Lower the stress in your life. Stress can make you want to   smoke. Try these things to help your stress: Getting regular exercise. Doing deep-breathing exercises. Doing yoga. Meditating. Doing a body scan. To do this, close your eyes, focus on one area of your body at a time from head to toe. Notice which parts of your body are tense. Try to relax the muscles in those areas. How will I feel when I quit smoking? Day 1 to 3 weeks Within the first 24 hours, you may start to have some problems that come from quitting tobacco.  These problems are very bad 2-3 days after you quit, but they do not often last for more than 2-3 weeks. You may get these symptoms: Mood swings. Feeling restless, nervous, angry, or annoyed. Trouble concentrating. Dizziness. Strong desire for high-sugar foods and nicotine. Weight gain. Trouble pooping (constipation). Feeling like you may vomit (nausea). Coughing or a sore throat. Changes in how the medicines that you take for other issues work in your body. Depression. Trouble sleeping (insomnia). Week 3 and afterward After the first 2-3 weeks of quitting, you may start to notice more positive results, such as: Better sense of smell and taste. Less coughing and sore throat. Slower heart rate. Lower blood pressure. Clearer skin. Better breathing. Fewer sick days. Quitting smoking can be hard. Do not give up if you fail the first time. Some people need to try a few times before they succeed. Do your best to stick to your quit plan, and talk with your doctor if you have any questions or concerns. Summary Smoking tobacco is the leading cause of preventable death. Quitting smoking can be hard, but it is one of the best things that you can do for your health. When you decide to quit smoking, make a plan to help you succeed. Quit smoking right away, not slowly over a period of time. When you start quitting, seek help from your doctor, family, or friends. This information is not intended to replace advice given to you by your health care provider. Make sure you discuss any questions you have with your health care provider. Document Revised: 11/30/2020 Document Reviewed: 06/12/2018 Elsevier Patient Education  2022 Elsevier Inc.  

## 2021-07-11 NOTE — Progress Notes (Signed)
? ? [name] ?[phone]   [phone] ? ?CONSULT NOTE ? ?REFERRING PHYSICIAN: Dr. Leory Plowman Icard ? ?REASON FOR CONSULTATION:  ?76 years old white male recently diagnosed with lung cancer ? ?HPI ?Edward Beck is a 76 y.o. male with past medical history significant for scrotal condyloma acuminata as well as benign prostatic hypertrophy and hematuria and long history of smoking.  The patient was diagnosed with COVID-19 in December 2022.  Few months later he was complaining of increasing shortness of breath and chest x-ray by his primary care physician suggested pneumonia.  He was treated with a course of antibiotics with no improvement.  The patient had CT angiogram of the chest on 05/21/2021 at [facility] in [facility] and it showed no evidence for pulmonary embolism but there was irregular masslike consolidation in the left lower lobe suspicious for malignancy with few other pulmonary nodules measuring 0.6 cm in the right middle lobe in addition to left hilar adenopathy and destruction of the left seventh rib.  The patient was referred to Dr. Valeta Harms and a PET scan was performed on June 06, 2021 and it showed paravertebral mass within the superior segment of the left lower lobe measuring 4.9 x 3.3 cm with SUV max of 27.8.  There was tracer avid left hilar lymph node identified with SUV max of 21.7.  There was 0.6 cm nodule in the right middle lobe with SUV max of 0.57 the scan also showed a small left pleural effusion was soft tissue pleural thickening overlying the lateral left upper lobe and superior segment of the left lower lobe.  There was pleural nodule overlying the posterior lateral left lower lobe measuring 2.5 cm with SUV max of 2.01.  There was evidence of chest wall involvement by the superior segment of left lower lobe lung mass with erosive changes involving the left sixth and seventh rib at the costovertebral junction.  The scan also showed  focal area of intense radiotracer uptake localizing to the right anterior aspect of the prostate gland measuring 2.2 cm with SUV max of 18.26.  The patient is followed by Dr. Alyson Ingles with [facility] and he will continue to monitor this area and address it later. ?The patient underwent flexible video fiberoptic bronchoscopy with robotic assistance and biopsies under the care of Dr. Valeta Harms on July 02, 2021 and the final pathology ([id number]) non-small cell carcinoma favoring squamous cell carcinoma. ?Dr. Valeta Harms kindly referred the patient to me today for evaluation and recommendation regarding treatment of his condition. ?When seen today he is feeling fine except for the pain on the left side rib cage with cough but no significant shortness of breath or current hemoptysis.  He denied having any nausea, vomiting, diarrhea but has intermittent constipation and he is currently on MiraLAX.  He lost around 20 pounds in the last 4 months.  He has no headache or visual changes.  He is scheduled to have MRI of the brain later today. ?Family history significant for mother with diabetes mellitus.  Father died at age 85 with throat cancer.  He also has a brother who had cancer and another brother with liver cirrhosis. ?The patient is married and has 1 daughter.  He was accompanied today by his wife [name].  He used to work as an Animal nutritionist for [employer].  He has a history of smoking 1 pack/day for around 62 years and unfortunately continues to smoke few cigarettes every day.  Also has  a history of alcohol abuse in the past but not recently he drinks alcohol occasionally now.  He has no history of drug abuse. ? ?HPI ? ?Past Medical History:  ?Diagnosis Date  ? Condyloma acuminatum of scrotum   ? COVID 2021  ? December 2022 - mild  ? Pneumonia   ? Scrotal lesion   ? ? ?Past Surgical History:  ?Procedure Laterality Date  ? BRONCHIAL BIOPSY  07/02/2021  ? Procedure: BRONCHIAL BIOPSIES;  Surgeon: Garner Nash, DO;  Location: [facility];  Service: Pulmonary;;  ? BRONCHIAL BRUSHINGS  07/02/2021  ? Procedure: BRONCHIAL BRUSHINGS;  Surgeon: Garner Nash, DO;  Location: [place];  Service: Pulmonary;;  ? BRONCHIAL NEEDLE ASPIRATION BIOPSY  07/02/2021  ? Procedure: BRONCHIAL NEEDLE ASPIRATION BIOPSIES;  Surgeon: Garner Nash, DO;  Location: [facility];  Service: Pulmonary;;  ? HERNIA REPAIR  2009  ? left inguinal  ? SCROTAL EXPLORATION  11/21/2011  ? Procedure: SCROTUM EXPLORATION;  Surgeon: Hanley Ben, MD;  Location: [facility];  Service: Urology;  Laterality: N/A;  EXCISION, BIOPSY SCROTAL CONDYLOMA ?  ? VIDEO BRONCHOSCOPY WITH ENDOBRONCHIAL ULTRASOUND Bilateral 07/02/2021  ? Procedure: VIDEO BRONCHOSCOPY WITH ENDOBRONCHIAL ULTRASOUND;  Surgeon: Garner Nash, DO;  Location: [place];  Service: Pulmonary;  Laterality: Bilateral;  ? ? ?No family history on file. ? ?Social History ?Social History  ? ?Tobacco Use  ? Smoking status: Every Day  ?  Packs/day: 1.00  ?  Years: 50.00  ?  Pack years: 50.00  ?  Types: Cigarettes  ?Vaping Use  ? Vaping Use: Former  ?Substance Use Topics  ? Alcohol use: Yes  ?  Alcohol/week: 1.0 standard drink  ?  Types: 1 Cans of beer per week  ?  Comment: occasionally  ? Drug use: Never  ? ? ?Allergies  ?Allergen Reactions  ? Tamiflu [Oseltamivir Phosphate] Rash  ? ? ?Current Outpatient Medications  ?Medication Sig Dispense Refill  ? finasteride (PROSCAR) 5 MG tablet Take 1 tablet (5 mg total) by mouth daily. 90 tablet 3  ? HYDROcodone-acetaminophen (NORCO) 10-325 MG tablet Take 1-2 tablets by mouth every 6 (six) hours as needed. 45 tablet 0  ? HYDROcodone-acetaminophen (NORCO/VICODIN) 5-325 MG tablet Take 1 tablet by mouth every 6 (six) hours as needed.    ? naproxen sodium (ALEVE) 220 MG tablet Take 220 mg by mouth 2 (two) times daily as needed (pain).    ? triamcinolone cream (KENALOG) 0.1 % Apply 1 application. topically 2 (two) times daily as needed (dry skin).    ?  umeclidinium-vilanterol (ANORO ELLIPTA) 62.5-25 MCG/ACT AEPB Inhale 1 puff into the lungs daily. 7 each 0  ? ?No current facility-administered medications for this visit.  ? ? ?Review of Systems ? ?Constitutional: positive for fatigue and weight loss ?Eyes: negative ?Ears, nose, mouth, throat, and face: negative ?Respiratory: positive for cough and pleurisy/chest pain ?Cardiovascular: negative ?Gastrointestinal: positive for constipation ?Genitourinary:negative ?Integument/breast: negative ?Hematologic/lymphatic: negative ?Musculoskeletal:positive for bone pain ?Neurological: negative ?Behavioral/Psych: negative ?Endocrine: negative ?Allergic/Immunologic: negative ? ?Physical Exam ? ?AYT:KZSW developed, anxious, cachectic, comfortable, and cooperative ?SKIN: skin color, texture, turgor are normal, no rashes or significant lesions ?HEAD: Normocephalic, No masses, lesions, tenderness or abnormalities ?EYES: normal, PERRLA, Conjunctiva are pink and non-injected ?EARS: External ears normal, Canals clear ?OROPHARYNX:no exudate, no erythema, and lips, buccal mucosa, and tongue normal  ?NECK: supple, no adenopathy, no JVD ?LYMPH:  no palpable lymphadenopathy, no hepatosplenomegaly ?LUNGS: clear to auscultation , and palpation ?HEART: regular rate & rhythm, no  murmurs, and no gallops ?ABDOMEN:abdomen soft, non-tender, normal bowel sounds, and no masses or organomegaly ?BACK: Back symmetric, no curvature., No CVA tenderness ?EXTREMITIES:no joint deformities, effusion, or inflammation, no edema  ?NEURO: alert & oriented x 3 with fluent speech, no focal motor/sensory deficits ? ?PERFORMANCE STATUS: ECOG 1 ? ?LABORATORY DATA: ?Lab Results  ?Component Value Date  ? WBC 6.6 11/20/2011  ? HGB 17.0 11/20/2011  ? HCT 48.7 11/20/2011  ? MCV 92.1 11/20/2011  ? PLT 261 11/20/2011  ? ? ?  Chemistry   ?   ?Component Value Date/Time  ? CREATININE 1.10 06/05/2017 0845  ? No results found for: CALCIUM, ALKPHOS, AST, ALT, BILITOT   ? ? ? ?RADIOGRAPHIC STUDIES: ?DG Chest Port 1 View ? ?Result Date: 07/02/2021 ?CLINICAL DATA:  Status post bronchoscopy with biopsy. EXAM: PORTABLE CHEST 1 VIEW COMPARISON:  June 21, 2021. FINDINGS: Stable cardiomediast

## 2021-07-11 NOTE — Progress Notes (Signed)
START OFF PATHWAY REGIMEN - Non-Small Cell Lung   [id number]:Cemiplimab 350 mg IV D1 + Carboplatin AUC=6 IV D1 + Paclitaxel 200 mg/m2 IV D1 q21 Days x 4 Cycles:   A cycle is every 21 days:     Paclitaxel      Carboplatin      Cemiplimab-rwlc   **Always confirm dose/schedule in your pharmacy ordering system**  Patient Characteristics: Stage IV Metastatic, Squamous, Molecular Analysis Not Elected, PS = 0, 1, Initial Chemotherapy/Immunotherapy, Immunotherapy Candidate, PD-L1 Expression Positive 1-49% (TPS) / Negative / Not Tested / Awaiting Test Results Therapeutic Status: Stage IV Metastatic Histology: Squamous Cell ECOG Performance Status: 1 Chemotherapy/Immunotherapy Line of Therapy: Initial Chemotherapy/Immunotherapy Immunotherapy Candidate Status: Candidate for Immunotherapy PD-L1 Expression Status: Awaiting Test Results Intent of Therapy: Non-Curative / Palliative Intent, Discussed with Patient 

## 2021-07-12 ENCOUNTER — Other Ambulatory Visit: Payer: Medicare Other

## 2021-07-12 ENCOUNTER — Telehealth: Payer: Self-pay | Admitting: Acute Care

## 2021-07-12 ENCOUNTER — Ambulatory Visit: Payer: Medicare Other

## 2021-07-12 ENCOUNTER — Telehealth: Payer: Self-pay | Admitting: Internal Medicine

## 2021-07-12 NOTE — Telephone Encounter (Signed)
Call report  ? ?IMPRESSION: ?No evidence of intracranial metastatic disease. ?  ?Nonspecific 8 mm enhancing lesion within the left temporoparietal ?calvarium. Given the appearance, this is favored to reflect a benign ?lesion. However, an osseous metastasis cannot be excluded and a ?short-interval 3 month follow-up brain MRI with contrast is ?recommended to ensure stability. ?  ?Mild chronic small vessel ischemic changes within the cerebral white ?matter and pons. ?  ?Mild generalized parenchymal atrophy. ?  ?  ?Electronically Signed ?  By: Kellie Simmering D.O. ?  On: 07/11/2021 18:16 ?

## 2021-07-12 NOTE — Telephone Encounter (Signed)
Scheduled per 04/06 los, patient has been called and notified of upcoming appointments. ?

## 2021-07-14 NOTE — Progress Notes (Signed)
?Radiation Oncology         [phone]) [phone] ?________________________________ ? ?Initial Outpatient Consultation ? ?Name: Edward Beck MRN: [id number]  ?Date: 07/15/2021  DOB: [birth year] ? ?QD:IYMEBRAX, Edward Millet, PA-C  Icard, Edward Graves, DO  ? ?REFERRING PHYSICIAN: Garner Nash, DO ? ?DIAGNOSIS: The encounter diagnosis was Stage IV squamous cell carcinoma of left lung ([place]). ? ?Stage IV (T3, N1, M1b) non-small cell lung cancer favoring squamous cell carcinoma presented with superior segment left lower lobe lung mass with invasion of the chest wall and destruction of the posterior left sixth and seventh ribs as well as left hilar adenopathy and suspicious pleural metastasis diagnosed in March 2023 ? ?HISTORY OF PRESENT ILLNESS::Edward Beck is a 76 y.o. male who is accompanied by his wife. he is seen as a courtesy of Dr. Valeta Beck for an opinion concerning radiation therapy as part of management for his recently diagnosed left lung cancer.  ? ?The patient underwent a chest CTA on 05/21/21 due to an elevated D-dimer which incidentally revealed an irregular masslike consolidative density in the superior aspect  ?of the left lower lobe most consistent with malignancy. CT also showed a few additional pulmonary nodules measuring up to 6 mm in the right middle lobe, a few small calcified granulomas, emphysematous changes of the lungs, left hilar adenopathy, small cortical erosion/destruction of the left seventh rib (subjacent to the left lower lobe mass), and a small left pleural effusion.  ? ?PET on 06/06/21 showed intense FDG uptake associated with the paravertebral mass involving the superior segment of left lower lobe. Signs of chest wall involvement were again identified, with erosive changes involving the adjacent left 6 and seventh ribs near the costovertebral junction. PET also showed FDG avid left hilar lymph node compatible with nodal metastasis, suspected pleural spread of disease with small left pleural  effusion, pleural nodularity, and soft tissue thickening of the pleura overlying the left upper lobe and superior segment left lower lobe. PET also noted intense focal FDG uptake associated with the prostate gland; primary prostate neoplasm could not be excluded in respect to this finding. ? ?Accordingly, the patient was referred to Dr. Erven Beck at [facility] on 06/21/21 for further management. Following review of recent imaging, the patient was recommended a urology referral to evaluate the prostate with biopsy of abnormal site on PET/CT, ultrasound guided diagnostic left thoracentesis to rule out metastatic disease in the left pleural effusion, and pulmonary consultation for bronchoscopy. ? ?The patient met with urology on 06/26/21 and elected for observation with repeat imaging in 3 months.  ? ?The patient proceeded to undergo bronchoscopy and LLL biopsies on 07/02/21 under the care of Dr. Valeta Beck. Pathology revealed findings consistent with non-small cell carcinoma, favoring squamous carcinoma of the LLL.  ? ?Pre-bronchoscopy chest CT on 07/02/21 showed the hypermetabolic LLL mass with probable chest wall invasion with erosion in the adjacent posterior medial left ribs. Hypermetabolic hilar nodes described on comparison PET-CT scan were not well demonstrated. Pleural thickening was however again demonstrated in the LLL, as well as 2 indeterminate right lung pulmonary nodules.  ? ?The patient was then referred to Dr. Julien Beck on 07/11/21 to discuss further treatment options. During this visit, the patient reported feeling fine except for the pain to the left rib cage with cough, but no significant shortness of breath or current hemoptysis.  He otherwise denied having any nausea, vomiting, diarrhea but endorsed intermittent constipation and is currently on MiraLAX.  He also reported loosing around 20  pounds in the last 4 months. Following discussion of further treatment options, the patient opted  to proceed with systemic chemotherapy consisting of carboplatin, paclitaxel, and Libtayo every 3 weeks with Neulasta support. Dr. Julien Beck also recommended further staging work-up via MRI of the brain.  ? ?The patient's case has been discussed at the tumor board held on 07/11/21.  ? ?MRI of the brain on 07/11/21 showed no evidence of intracranial metastatic disease. MRI did however reveal a likely benign non-specific 8 mm enhancing lesion within the left temporoparietal calvarium.  However, an osseous metastasis could not be excluded and a short-interval 3 month follow-up brain MRI with contrast is recommended to ensure stability. ? ?PREVIOUS RADIATION THERAPY: No ? ?PAST MEDICAL HISTORY:  ?Past Medical History:  ?Diagnosis Date  ? Condyloma acuminatum of scrotum   ? COVID 2021  ? December 2022 - mild  ? Lung cancer ([place])   ? Pneumonia   ? Scrotal lesion   ? ? ?PAST SURGICAL HISTORY: ?Past Surgical History:  ?Procedure Laterality Date  ? BRONCHIAL BIOPSY  07/02/2021  ? Procedure: BRONCHIAL BIOPSIES;  Surgeon: Edward Nash, DO;  Location: [facility];  Service: Pulmonary;;  ? BRONCHIAL BRUSHINGS  07/02/2021  ? Procedure: BRONCHIAL BRUSHINGS;  Surgeon: Edward Nash, DO;  Location: [place];  Service: Pulmonary;;  ? BRONCHIAL NEEDLE ASPIRATION BIOPSY  07/02/2021  ? Procedure: BRONCHIAL NEEDLE ASPIRATION BIOPSIES;  Surgeon: Edward Nash, DO;  Location: [facility];  Service: Pulmonary;;  ? HERNIA REPAIR  2009  ? left inguinal  ? SCROTAL EXPLORATION  11/21/2011  ? Procedure: SCROTUM EXPLORATION;  Surgeon: Edward Ben, MD;  Location[facility];  Service: Urology;  Laterality: N/A;  EXCISION, BIOPSY SCROTAL CONDYLOMA ?  ? VIDEO BRONCHOSCOPY WITH ENDOBRONCHIAL ULTRASOUND Bilateral 07/02/2021  ? Procedure: VIDEO BRONCHOSCOPY WITH ENDOBRONCHIAL ULTRASOUND;  Surgeon: Edward Nash, DO;  Location: [place];  Service: Pulmonary;  Laterality: Bilateral;  ? ? ?FAMILY HISTORY: History reviewed. No pertinent  family history. ? ?SOCIAL HISTORY:  ?Social History  ? ?Tobacco Use  ? Smoking status: Every Day  ?  Packs/day: 1.00  ?  Years: 50.00  ?  Pack years: 50.00  ?  Types: Cigarettes  ?Vaping Use  ? Vaping Use: Former  ?Substance Use Topics  ? Alcohol use: Yes  ?  Alcohol/week: 1.0 standard drink  ?  Types: 1 Cans of beer per week  ?  Comment: occasionally  ? Drug use: Never  ? ? ?ALLERGIES:  ?Allergies  ?Allergen Reactions  ? Tamiflu [Oseltamivir Phosphate] Rash  ? ? ?MEDICATIONS:  ?Current Outpatient Medications  ?Medication Sig Dispense Refill  ? finasteride (PROSCAR) 5 MG tablet Take 1 tablet (5 mg total) by mouth daily. 90 tablet 3  ? HYDROcodone-acetaminophen (NORCO) 10-325 MG tablet Take 1-2 tablets by mouth every 6 (six) hours as needed. 45 tablet 0  ? naproxen sodium (ALEVE) 220 MG tablet Take 220 mg by mouth 2 (two) times daily as needed (pain).    ? triamcinolone cream (KENALOG) 0.1 % Apply 1 application. topically 2 (two) times daily as needed (dry skin).    ? lidocaine-prilocaine (EMLA) cream Apply to the Port-A-Cath site 30 minutes before chemotherapy. (Patient not taking: Reported on 07/15/2021) 30 g 0  ? prochlorperazine (COMPAZINE) 10 MG tablet Take 1 tablet (10 mg total) by mouth every 6 (six) hours as needed for nausea or vomiting. (Patient not taking: Reported on 07/15/2021) 30 tablet 0  ? umeclidinium-vilanterol (ANORO ELLIPTA) 62.5-25 MCG/ACT AEPB Inhale 1 puff  into the lungs daily. (Patient not taking: Reported on 07/15/2021) 7 each 0  ? ?No current facility-administered medications for this encounter.  ? ? ?REVIEW OF SYSTEMS:  A 10+ POINT REVIEW OF SYSTEMS WAS OBTAINED including neurology, dermatology, psychiatry, cardiac, respiratory, lymph, extremities, GI, GU, musculoskeletal, constitutional, reproductive, HEENT.  He does have pain along the rib cage area where his tumor has invaded the ribs.  He is requiring 10 mg of hydrocodone approximately 4 times per day to control his pain. ?  ?PHYSICAL  EXAM:  height is $RemoveB'6\' 2"'WGedMViT$  (1.88 m) and weight is 168 lb 6.4 oz (76.4 kg). His temperature is 97.7 ?F (36.5 ?C). His blood pressure is 138/84 and his pulse is 81. His respiration is 20 and oxygen saturation is 99%

## 2021-07-15 ENCOUNTER — Inpatient Hospital Stay: Payer: Medicare Other

## 2021-07-15 ENCOUNTER — Other Ambulatory Visit: Payer: Self-pay | Admitting: Radiology

## 2021-07-15 ENCOUNTER — Encounter: Payer: Self-pay | Admitting: Radiation Oncology

## 2021-07-15 ENCOUNTER — Other Ambulatory Visit: Payer: Self-pay

## 2021-07-15 ENCOUNTER — Encounter: Payer: Self-pay | Admitting: *Deleted

## 2021-07-15 ENCOUNTER — Ambulatory Visit
Admission: RE | Admit: 2021-07-15 | Discharge: 2021-07-15 | Disposition: A | Discharge status: Home / Self Care | Payer: Medicare Other | Source: Ambulatory Visit | Attending: Radiation Oncology | Admitting: Radiation Oncology

## 2021-07-15 VITALS — BP 138/84 | HR 81 | Temp 97.7°F | Resp 20 | Ht 74.0 in | Wt 168.4 lb

## 2021-07-15 DIAGNOSIS — C782 Secondary malignant neoplasm of pleura: Secondary | ICD-10-CM | POA: Diagnosis not present

## 2021-07-15 DIAGNOSIS — J90 Pleural effusion, not elsewhere classified: Secondary | ICD-10-CM | POA: Insufficient documentation

## 2021-07-15 DIAGNOSIS — Z8616 Personal history of COVID-19: Secondary | ICD-10-CM | POA: Diagnosis not present

## 2021-07-15 DIAGNOSIS — Z79899 Other long term (current) drug therapy: Secondary | ICD-10-CM | POA: Insufficient documentation

## 2021-07-15 DIAGNOSIS — Z510 Encounter for antineoplastic radiation therapy: Secondary | ICD-10-CM | POA: Insufficient documentation

## 2021-07-15 DIAGNOSIS — Z5112 Encounter for antineoplastic immunotherapy: Secondary | ICD-10-CM | POA: Diagnosis not present

## 2021-07-15 DIAGNOSIS — C7989 Secondary malignant neoplasm of other specified sites: Secondary | ICD-10-CM | POA: Insufficient documentation

## 2021-07-15 DIAGNOSIS — C7951 Secondary malignant neoplasm of bone: Secondary | ICD-10-CM | POA: Insufficient documentation

## 2021-07-15 DIAGNOSIS — R634 Abnormal weight loss: Secondary | ICD-10-CM | POA: Diagnosis not present

## 2021-07-15 DIAGNOSIS — C3432 Malignant neoplasm of lower lobe, left bronchus or lung: Secondary | ICD-10-CM | POA: Insufficient documentation

## 2021-07-15 DIAGNOSIS — Z86010 Personal history of colonic polyps: Secondary | ICD-10-CM | POA: Insufficient documentation

## 2021-07-15 DIAGNOSIS — I6782 Cerebral ischemia: Secondary | ICD-10-CM | POA: Insufficient documentation

## 2021-07-15 DIAGNOSIS — Z5111 Encounter for antineoplastic chemotherapy: Secondary | ICD-10-CM | POA: Diagnosis not present

## 2021-07-15 DIAGNOSIS — F17210 Nicotine dependence, cigarettes, uncomplicated: Secondary | ICD-10-CM | POA: Diagnosis not present

## 2021-07-15 DIAGNOSIS — K5900 Constipation, unspecified: Secondary | ICD-10-CM | POA: Insufficient documentation

## 2021-07-15 DIAGNOSIS — C3492 Malignant neoplasm of unspecified part of left bronchus or lung: Secondary | ICD-10-CM

## 2021-07-15 HISTORY — DX: Malignant neoplasm of unspecified part of unspecified bronchus or lung: C34.90

## 2021-07-15 NOTE — Progress Notes (Signed)
Oncology Nurse Navigator Documentation ? ? ?  07/15/2021  ?  8:00 AM 07/11/2021  ? 12:00 PM 07/11/2021  ? 10:00 AM  ?Oncology Nurse Navigator Flowsheets  ?Abnormal Finding Date   06/07/2021  ?Confirmed Diagnosis Date   07/02/2021  ?Diagnosis Status Confirmed Diagnosis Complete  Pending Molecular Studies  ?Planned Course of Treatment Chemo/Radiation Concurrent    ?Phase of Treatment Radiation    ?Chemotherapy Actual Start Date: 07/18/2021    ?Navigator Follow Up Date: 07/24/2021  07/15/2021  ?Navigator Follow Up Reason: Follow-up Appointment  Appointment Review  ?Navigator Location [facility]  ?Referral Date to RadOnc/MedOnc   07/10/2021  ?Navigator Encounter Type Appt/Treatment Plan Review  Appt/Treatment Plan Review  ?Treatment Initiated Date 07/18/2021    ?Patient Visit Type Other Other MedOnc;Initial  ?Treatment Phase Pre-Tx/Tx Discussion Pre-Tx/Tx Discussion Pre-Tx/Tx Discussion  ?Barriers/Navigation Needs Coordination of Care/I followed up on Mr. Blando plan of care.  He is set up for systemic therapy and will follow up on radiation appts. Coordination of Care Coordination of Care  ?Interventions Coordination of Care Coordination of Care Coordination of Care  ?Acuity Level 2-Minimal Needs (1-2 Barriers Identified) Level 2-Minimal Needs (1-2 Barriers Identified) Level 2-Minimal Needs (1-2 Barriers Identified)  ?Coordination of Care Other Pathology Pathology  ?Time Spent with Patient 30 30 30   ?  ?

## 2021-07-15 NOTE — Progress Notes (Signed)
See MD note for nursing evaluation. °

## 2021-07-16 ENCOUNTER — Ambulatory Visit (HOSPITAL_COMMUNITY)
Admission: RE | Admit: 2021-07-16 | Discharge: 2021-07-16 | Disposition: A | Discharge status: Home / Self Care | Payer: Medicare Other | Source: Ambulatory Visit | Attending: Internal Medicine | Admitting: Internal Medicine

## 2021-07-16 ENCOUNTER — Other Ambulatory Visit: Payer: Self-pay | Admitting: Internal Medicine

## 2021-07-16 ENCOUNTER — Ambulatory Visit: Payer: Medicare Other | Admitting: Acute Care

## 2021-07-16 ENCOUNTER — Encounter (HOSPITAL_COMMUNITY): Payer: Self-pay

## 2021-07-16 ENCOUNTER — Encounter: Payer: Self-pay | Admitting: *Deleted

## 2021-07-16 ENCOUNTER — Other Ambulatory Visit: Payer: Self-pay

## 2021-07-16 DIAGNOSIS — F17210 Nicotine dependence, cigarettes, uncomplicated: Secondary | ICD-10-CM | POA: Insufficient documentation

## 2021-07-16 DIAGNOSIS — C3432 Malignant neoplasm of lower lobe, left bronchus or lung: Secondary | ICD-10-CM

## 2021-07-16 DIAGNOSIS — Z452 Encounter for adjustment and management of vascular access device: Secondary | ICD-10-CM | POA: Diagnosis not present

## 2021-07-16 DIAGNOSIS — C7989 Secondary malignant neoplasm of other specified sites: Secondary | ICD-10-CM | POA: Diagnosis not present

## 2021-07-16 DIAGNOSIS — C3492 Malignant neoplasm of unspecified part of left bronchus or lung: Secondary | ICD-10-CM | POA: Diagnosis not present

## 2021-07-16 DIAGNOSIS — Z510 Encounter for antineoplastic radiation therapy: Secondary | ICD-10-CM | POA: Diagnosis not present

## 2021-07-16 DIAGNOSIS — C7951 Secondary malignant neoplasm of bone: Secondary | ICD-10-CM | POA: Diagnosis not present

## 2021-07-16 DIAGNOSIS — Z5111 Encounter for antineoplastic chemotherapy: Secondary | ICD-10-CM | POA: Diagnosis not present

## 2021-07-16 DIAGNOSIS — Z5112 Encounter for antineoplastic immunotherapy: Secondary | ICD-10-CM | POA: Diagnosis not present

## 2021-07-16 HISTORY — PX: IR IMAGING GUIDED PORT INSERTION: IMG5740

## 2021-07-16 MED ORDER — FENTANYL CITRATE (PF) 100 MCG/2ML IJ SOLN
INTRAMUSCULAR | Status: AC
  Filled 2021-07-16: qty 2

## 2021-07-16 MED ORDER — LIDOCAINE HCL 1 % IJ SOLN
INTRAMUSCULAR | Status: AC
  Filled 2021-07-16: qty 20

## 2021-07-16 MED ORDER — HEPARIN SOD (PORK) LOCK FLUSH 100 UNIT/ML IV SOLN
INTRAVENOUS | Status: AC
  Filled 2021-07-16: qty 5

## 2021-07-16 MED ORDER — HEPARIN SOD (PORK) LOCK FLUSH 100 UNIT/ML IV SOLN
INTRAVENOUS | Status: AC | PRN
  Administered 2021-07-16: 500 [IU] via INTRAVENOUS

## 2021-07-16 MED ORDER — MIDAZOLAM HCL 2 MG/2ML IJ SOLN
INTRAMUSCULAR | Status: AC
  Filled 2021-07-16: qty 4

## 2021-07-16 MED ORDER — FENTANYL CITRATE (PF) 100 MCG/2ML IJ SOLN
INTRAMUSCULAR | Status: AC | PRN
Start: 2021-07-16 — End: 2021-07-16
  Administered 2021-07-16 (×2): 50 ug via INTRAVENOUS

## 2021-07-16 MED ORDER — MIDAZOLAM HCL 2 MG/2ML IJ SOLN
INTRAMUSCULAR | Status: AC | PRN
  Administered 2021-07-16 (×2): 1 mg via INTRAVENOUS

## 2021-07-16 MED ORDER — LIDOCAINE HCL (PF) 1 % IJ SOLN
INTRAMUSCULAR | Status: AC | PRN
  Administered 2021-07-16: 5 mL

## 2021-07-16 MED ORDER — LIDOCAINE-EPINEPHRINE 1 %-1:100000 IJ SOLN
INTRAMUSCULAR | Status: AC
  Filled 2021-07-16: qty 1

## 2021-07-16 MED ORDER — SODIUM CHLORIDE 0.9 % IV SOLN: INTRAVENOUS | Status: DC

## 2021-07-16 MED ORDER — LIDOCAINE HCL 1 % IJ SOLN
INTRAMUSCULAR | Status: AC | PRN
  Administered 2021-07-16: 20 mL

## 2021-07-16 NOTE — Progress Notes (Signed)
Oncology Nurse Navigator Documentation ? ? ?  07/16/2021  ? 10:00 AM 07/15/2021  ?  8:00 AM 07/11/2021  ? 12:00 PM 07/11/2021  ? 10:00 AM  ?Oncology Nurse Navigator Flowsheets  ?Abnormal Finding Date    06/07/2021  ?Confirmed Diagnosis Date    07/02/2021  ?Diagnosis Status  Confirmed Diagnosis Complete  Pending Molecular Studies  ?Planned Course of Treatment  Chemo/Radiation Concurrent    ?Phase of Treatment Radiation Radiation    ?Chemotherapy Actual Start Date:  07/18/2021    ?Radiation Actual Start Date: 07/22/2021     ?Navigator Follow Up Date:  07/24/2021  07/15/2021  ?Navigator Follow Up Reason:  Follow-up Appointment  Appointment Review  ?[facility]  ?Referral Date to RadOnc/MedOnc    07/10/2021  ?Navigator Encounter Type Appt/Treatment Plan Review Appt/Treatment Plan Review  Appt/Treatment Plan Review  ?Treatment Initiated Date  07/18/2021    ?Patient Visit Type  Other Other MedOnc;Initial  ?Treatment Phase Pre-Tx/Tx Discussion Pre-Tx/Tx Discussion Pre-Tx/Tx Discussion Pre-Tx/Tx Discussion  ?Barriers/Navigation Needs Coordination of Care Coordination of Care Coordination of Care Coordination of Care  ?Interventions Coordination of Care/I followed up on his appts with rad onc. He is set up for his treatment plan at this time.  Coordination of Care Coordination of Care Coordination of Care  ?Acuity Level 2-Minimal Needs (1-2 Barriers Identified) Level 2-Minimal Needs (1-2 Barriers Identified) Level 2-Minimal Needs (1-2 Barriers Identified) Level 2-Minimal Needs (1-2 Barriers Identified)  ?Coordination of Care Other Other Pathology Pathology  ?Time Spent with Patient 30 30 30 30   ?  ?

## 2021-07-16 NOTE — Discharge Instructions (Signed)
Please call Interventional Radiology clinic [phone] with any questions or concerns. ? ?You may remove your dressing and shower tomorrow. ? ?DO NOT use EMLA cream on your port site for 2 weeks as this cream will remove surgical glue on your incision. ? ?Implanted Port Insertion, Care After ?This sheet gives you information about how to care for yourself after your procedure. Your health care provider may also give you more specific instructions. If you have problems or questions, contact your health care provider. ?What can I expect after the procedure? ?After the procedure, it is common to have: ?Discomfort at the port insertion site. ?Bruising on the skin over the port. This should improve over 3-4 days. ?Follow these instructions at home: ?Port care ?After your port is placed, you will get a manufacturer's information card. The card has information about your port. Keep this card with you at all times. ?Take care of the port as told by your health care provider. Ask your health care provider if you or a family member can get training for taking care of the port at home. A home health care nurse may also take care of the port. ?Make sure to remember what type of port you have. ?Incision care ?Follow instructions from your health care provider about how to take care of your port insertion site. Make sure you: ?Wash your hands with soap and water before and after you change your bandage (dressing). If soap and water are not available, use hand sanitizer. ?Change your dressing as told by your health care provider. ?Leave stitches (sutures), skin glue, or adhesive strips in place. These skin closures may need to stay in place for 2 weeks or longer. If adhesive strip edges start to loosen and curl up, you may trim the loose edges. Do not remove adhesive strips completely unless your health care provider tells you to do that. ?Check your port insertion site every day for signs of infection. Check for: ?Redness,  swelling, or pain. ?Fluid or blood. ?Warmth. ?Pus or a bad smell.  ? ?  ? ?  ?Activity ?Return to your normal activities as told by your health care provider. Ask your health care provider what activities are safe for you. ?Do not lift anything that is heavier than 10 lb (4.5 kg), or the limit that you are told, until your health care provider says that it is safe. ?General instructions ?Take over-the-counter and prescription medicines only as told by your health care provider. ?Do not take baths, swim, or use a hot tub until your health care provider approves. Ask your health care provider if you may take showers. You may only be allowed to take sponge baths. ?Do not drive for 24 hours if you were given a sedative during your procedure. ?Wear a medical alert bracelet in case of an emergency. This will tell any health care providers that you have a port. ?Keep all follow-up visits as told by your health care provider. This is important. ?Contact a health care provider if: ?You cannot flush your port with saline as directed, or you cannot draw blood from the port. ?You have a fever or chills. ?You have redness, swelling, or pain around your port insertion site. ?You have fluid or blood coming from your port insertion site. ?Your port insertion site feels warm to the touch. ?You have pus or a bad smell coming from the port insertion site. ?Get help right away if: ?You have chest pain or shortness of breath. ?You have bleeding from  your port that you cannot control. ?Summary ?Take care of the port as told by your health care provider. Keep the manufacturer's information card with you at all times. ?Change your dressing as told by your health care provider. ?Contact a health care provider if you have a fever or chills or if you have redness, swelling, or pain around your port insertion site. ?Keep all follow-up visits as told by your health care provider. ?This information is not intended to replace advice given to you  by your health care provider. Make sure you discuss any questions you have with your health care provider. ?Document Revised: 10/20/2017 Document Reviewed: 10/20/2017 ?Elsevier Patient Education ? [place]. ? ? ?Moderate Conscious Sedation, Adult, Care After ?This sheet gives you information about how to care for yourself after your procedure. Your health care provider may also give you more specific instructions. If you have problems or questions, contact your health care provider. ?What can I expect after the procedure? ?After the procedure, it is common to have: ?Sleepiness for several hours. ?Impaired judgment for several hours. ?Difficulty with balance. ?Vomiting if you eat too soon. ?Follow these instructions at home: ?For the time period you were told by your health care provider: ?Rest. ?Do not participate in activities where you could fall or become injured. ?Do not drive or use machinery. ?Do not drink alcohol. ?Do not take sleeping pills or medicines that cause drowsiness. ?Do not make important decisions or sign legal documents. ?Do not take care of children on your own.  ? ?  ? ?  ?Eating and drinking ?Follow the diet recommended by your health care provider. ?Drink enough fluid to keep your urine pale yellow. ?If you vomit: ?Drink water, juice, or soup when you can drink without vomiting. ?Make sure you have little or no nausea before eating solid foods.  ?  ?General instructions ?Take over-the-counter and prescription medicines only as told by your health care provider. ?Have a responsible adult stay with you for the time you are told. It is important to have someone help care for you until you are awake and alert. ?Do not smoke. ?Keep all follow-up visits as told by your health care provider. This is important. ?Contact a health care provider if: ?You are still sleepy or having trouble with balance after 24 hours. ?You feel light-headed. ?You keep feeling nauseous or you keep vomiting. ?You  develop a rash. ?You have a fever. ?You have redness or swelling around the IV site. ?Get help right away if: ?You have trouble breathing. ?You have new-onset confusion at home. ?Summary ?After the procedure, it is common to feel sleepy, have impaired judgment, or feel nauseous if you eat too soon. ?Rest after you get home. Know the things you should not do after the procedure. ?Follow the diet recommended by your health care provider and drink enough fluid to keep your urine pale yellow. ?Get help right away if you have trouble breathing or new-onset confusion at home. ?This information is not intended to replace advice given to you by your health care provider. Make sure you discuss any questions you have with your health care provider. ?Document Revised: 07/22/2019 Document Reviewed: 02/17/2019 ?Elsevier Patient Education ? [place].  ?

## 2021-07-16 NOTE — Procedures (Signed)
Interventional Radiology Procedure: ? ? ?Indications: Left lung cancer ? ?Procedure: Port placement ? ?Findings: Right jugular port, tip at SVC/RA junction ? ?Complications: None ?    ?EBL: Minimal, less than 10 ml ? ?Plan: Discharge in one hour.  Keep port site and incisions dry for at least 24 hours.   ? ? ?Edward Beck R. Anselm Pancoast, MD  ?Pager: [phone] ? ?  ?

## 2021-07-16 NOTE — Consult Note (Signed)
? ?Chief Complaint: ?Patient was seen in consultation today for  port a cath placement ? ?Referring Physician(s): ?Mohamed,Mohamed ? ?Supervising Physician: Markus Daft ? ?Patient Status: [place] ? ?History of Present Illness: ?Edward Beck is a 76 y.o. male smoker with PMH COVID -16 in Dec 2022, BPH ,scrotal condyloma acuminata and now with newly diagnosed lung cancer. He has stage IV (T3, N1, M1b) non-small cell lung cancer favoring squamous cell carcinoma and presented with superior segment left lower lobe lung mass with invasion of the chest wall and destruction of the posterior left sixth and seventh ribs as well as left hilar adenopathy and suspicious pleural metastasis diagnosed in March 2023. He is scheduled today for port a cath placement to assist with treatment.  ? ?Past Medical History:  ?Diagnosis Date  ? Condyloma acuminatum of scrotum   ? COVID 2021  ? December 2022 - mild  ? Lung cancer ([place])   ? Pneumonia   ? Scrotal lesion   ? ? ?Past Surgical History:  ?Procedure Laterality Date  ? BRONCHIAL BIOPSY  07/02/2021  ? Procedure: BRONCHIAL BIOPSIES;  Surgeon: Garner Nash, DO;  Location: [facility];  Service: Pulmonary;;  ? BRONCHIAL BRUSHINGS  07/02/2021  ? Procedure: BRONCHIAL BRUSHINGS;  Surgeon: Garner Nash, DO;  Location: [facility];  Service: Pulmonary;;  ? BRONCHIAL NEEDLE ASPIRATION BIOPSY  07/02/2021  ? Procedure: BRONCHIAL NEEDLE ASPIRATION BIOPSIES;  Surgeon: Garner Nash, DO;  Location: [facility];  Service: Pulmonary;;  ? HERNIA REPAIR  2009  ? left inguinal  ? SCROTAL EXPLORATION  11/21/2011  ? Procedure: SCROTUM EXPLORATION;  Surgeon: Hanley Ben, MD;  Location: [facility];  Service: Urology;  Laterality: N/A;  EXCISION, BIOPSY SCROTAL CONDYLOMA ?  ? VIDEO BRONCHOSCOPY WITH ENDOBRONCHIAL ULTRASOUND Bilateral 07/02/2021  ? Procedure: VIDEO BRONCHOSCOPY WITH ENDOBRONCHIAL ULTRASOUND;  Surgeon: Garner Nash, DO;  Location: [facility];  Service: Pulmonary;   Laterality: Bilateral;  ? ? ?Allergies: ?Tamiflu [oseltamivir phosphate] ? ?Medications: ?Prior to Admission medications   ?Medication Sig Start Date End Date Taking? Authorizing Provider  ?finasteride (PROSCAR) 5 MG tablet Take 1 tablet (5 mg total) by mouth daily. 06/26/21  Yes McKenzie, Candee Furbish, MD  ?HYDROcodone-acetaminophen (NORCO) 10-325 MG tablet Take 1-2 tablets by mouth every 6 (six) hours as needed. 07/10/21 08/09/21 Yes Icard, Octavio Graves, DO  ?naproxen sodium (ALEVE) 220 MG tablet Take 220 mg by mouth 2 (two) times daily as needed (pain).   Yes [provider]  ?triamcinolone cream (KENALOG) 0.1 % Apply 1 application. topically 2 (two) times daily as needed (dry skin).   Yes [provider]  ?lidocaine-prilocaine (EMLA) cream Apply to the Port-A-Cath site 30 minutes before chemotherapy. ?Patient not taking: Reported on 07/15/2021 07/11/21   Curt Bears, MD  ?prochlorperazine (COMPAZINE) 10 MG tablet Take 1 tablet (10 mg total) by mouth every 6 (six) hours as needed for nausea or vomiting. ?Patient not taking: Reported on 07/15/2021 07/11/21   Curt Bears, MD  ?umeclidinium-vilanterol [facility] ELLIPTA) 62.5-25 MCG/ACT AEPB Inhale 1 puff into the lungs daily. ?Patient not taking: Reported on 07/15/2021 07/10/21   Garner Nash, DO  ?  ? ?History reviewed. No pertinent family history. ? ?Social History  ? ?Socioeconomic History  ? Marital status: Married  ?  Spouse name: Not on file  ? Number of children: Not on file  ? Years of education: Not on file  ? Highest education level: Not on file  ?Occupational History  ? Not on  file  ?Tobacco Use  ? Smoking status: Every Day  ?  Packs/day: 1.00  ?  Years: 50.00  ?  Pack years: 50.00  ?  Types: Cigarettes  ? Smokeless tobacco: Not on file  ?Vaping Use  ? Vaping Use: Former  ?Substance and Sexual Activity  ? Alcohol use: Yes  ?  Alcohol/week: 1.0 standard drink  ?  Types: 1 Cans of beer per week  ?  Comment: occasionally  ? Drug use: Never  ? Sexual  activity: Not on file  ?Other Topics Concern  ? Not on file  ?Social History Narrative  ? Not on file  ? ?Social Determinants of Health  ? ?Financial Resource Strain: Not on file  ?Food Insecurity: Not on file  ?Transportation Needs: Not on file  ?Physical Activity: Not on file  ?Stress: Not on file  ?Social Connections: Not on file  ? ? ? ? ?Review of Systems denies fever,HA,CP,worsening dyspnea, cough, abd/back pain,N/V or bleeding ? ?Vital Signs: ?BP (!) 145/78   Pulse 80   Temp 98.7 ?F (37.1 ?C) (Oral)   Resp 19   Ht 6\' 2"  (1.88 m)   Wt 169 lb (76.7 kg)   SpO2 97%   BMI 21.70 kg/m?  ? ?Physical Exam awake/alert; chest- sl dim BS left base, right clear; heart- RRR; abd- soft,+BS,NT; no LE edema ? ?Imaging: ?MR BRAIN W WO CONTRAST ? ?Result Date: 07/11/2021 ?CLINICAL DATA:  Provided history: Lung mass. Lung nodule. EXAM: MRI HEAD WITHOUT AND WITH CONTRAST TECHNIQUE: Multiplanar, multiecho pulse sequences of the brain and surrounding structures were obtained without and with intravenous contrast. CONTRAST:  7.73mL GADAVIST GADOBUTROL 1 MMOL/ML IV SOLN COMPARISON:  No pertinent prior exams available for comparison. FINDINGS: Brain: Mild generalized parenchymal atrophy. Mild multifocal T2 FLAIR hyperintense signal abnormality within the cerebral white matter and pons, nonspecific but compatible with chronic small vessel ischemic disease. There is no acute infarct. No evidence of an intracranial mass. No chronic intracranial blood products. No extra-axial fluid collection. No midline shift. No pathologic intracranial enhancement identified. Vascular: Maintained flow voids within the proximal large arterial vessels. Left frontal lobe developmental venous anomaly (anatomic variant). Skull and upper cervical spine: Nonspecific 8 mm T2 FLAIR hyperintense and enhancing lesion within the left temporoparietal calvarium (series 11, image 27) (series 16, image 73). Sinuses/Orbits: Visualized orbits show no acute finding.  Trace mucosal thickening within the bilateral ethmoid sinuses. Impression #2 will be called to the ordering clinician or representative by the Radiologist Assistant, and communication documented in the PACS or [employer]. IMPRESSION: No evidence of intracranial metastatic disease. Nonspecific 8 mm enhancing lesion within the left temporoparietal calvarium. Given the appearance, this is favored to reflect a benign lesion. However, an osseous metastasis cannot be excluded and a short-interval 3 month follow-up brain MRI with contrast is recommended to ensure stability. Mild chronic small vessel ischemic changes within the cerebral white matter and pons. Mild generalized parenchymal atrophy. Electronically Signed   By: Kellie Simmering D.O.   On: 07/11/2021 18:16  ? ?DG Chest Port 1 View ? ?Result Date: 07/02/2021 ?CLINICAL DATA:  Status post bronchoscopy with biopsy. EXAM: PORTABLE CHEST 1 VIEW COMPARISON:  June 21, 2021. FINDINGS: Stable cardiomediastinal silhouette. Right lung is clear. No pneumothorax is noted. Stable loculated left pleural effusion is noted with left basilar mass and scarring or atelectasis. Bony thorax is unremarkable. IMPRESSION: Stable left basilar opacities as described above. No pneumothorax is noted. Electronically Signed   By: Marijo Conception  M.D.   On: 07/02/2021 10:42  ? ?CT Super D Chest Wo Contrast ? ?Result Date: 07/03/2021 ?CLINICAL DATA:  Lung mass. EXAM: CT CHEST WITHOUT CONTRAST TECHNIQUE: Multidetector CT imaging of the chest was performed using thin slice collimation for electromagnetic bronchoscopy planning purposes, without intravenous contrast. RADIATION DOSE REDUCTION: This exam was performed according to the departmental dose-optimization program which includes automated exposure control, adjustment of the mA and/or kV according to patient size and/or use of iterative reconstruction technique. COMPARISON:  PET-CT 06/06/2021 FINDINGS: Cardiovascular: Coronary artery  calcification and aortic atherosclerotic calcification. Mediastinum/Nodes: No axillary or supraclavicular adenopathy. No mediastinal or hilar adenopathy. No pericardial fluid. Esophagus normal. Lungs/Pleura: LEFT lower

## 2021-07-17 ENCOUNTER — Other Ambulatory Visit: Payer: Self-pay

## 2021-07-17 ENCOUNTER — Other Ambulatory Visit: Payer: Self-pay | Admitting: Physician Assistant

## 2021-07-17 ENCOUNTER — Other Ambulatory Visit: Payer: Self-pay | Admitting: Medical Oncology

## 2021-07-17 DIAGNOSIS — Z298 Encounter for other specified prophylactic measures: Secondary | ICD-10-CM

## 2021-07-17 DIAGNOSIS — R5383 Other fatigue: Secondary | ICD-10-CM

## 2021-07-17 DIAGNOSIS — C3492 Malignant neoplasm of unspecified part of left bronchus or lung: Secondary | ICD-10-CM

## 2021-07-18 ENCOUNTER — Other Ambulatory Visit: Payer: Self-pay | Admitting: Internal Medicine

## 2021-07-18 ENCOUNTER — Other Ambulatory Visit: Payer: Self-pay

## 2021-07-18 ENCOUNTER — Other Ambulatory Visit: Payer: Medicare Other

## 2021-07-18 ENCOUNTER — Inpatient Hospital Stay: Payer: Medicare Other

## 2021-07-18 VITALS — BP 119/74 | HR 70 | Temp 98.3°F | Resp 17 | Wt 169.0 lb

## 2021-07-18 DIAGNOSIS — Z5112 Encounter for antineoplastic immunotherapy: Secondary | ICD-10-CM | POA: Diagnosis not present

## 2021-07-18 DIAGNOSIS — Z95828 Presence of other vascular implants and grafts: Secondary | ICD-10-CM

## 2021-07-18 DIAGNOSIS — C3432 Malignant neoplasm of lower lobe, left bronchus or lung: Secondary | ICD-10-CM | POA: Diagnosis not present

## 2021-07-18 DIAGNOSIS — C3492 Malignant neoplasm of unspecified part of left bronchus or lung: Secondary | ICD-10-CM

## 2021-07-18 DIAGNOSIS — Z5111 Encounter for antineoplastic chemotherapy: Secondary | ICD-10-CM | POA: Diagnosis not present

## 2021-07-18 DIAGNOSIS — R5383 Other fatigue: Secondary | ICD-10-CM

## 2021-07-18 DIAGNOSIS — Z510 Encounter for antineoplastic radiation therapy: Secondary | ICD-10-CM | POA: Diagnosis not present

## 2021-07-18 DIAGNOSIS — C7989 Secondary malignant neoplasm of other specified sites: Secondary | ICD-10-CM | POA: Diagnosis not present

## 2021-07-18 DIAGNOSIS — C7951 Secondary malignant neoplasm of bone: Secondary | ICD-10-CM | POA: Diagnosis not present

## 2021-07-18 LAB — CBC WITH DIFFERENTIAL (CANCER CENTER ONLY)
Abs Immature Granulocytes: 0.07 10*3/uL (ref 0.00–0.07)
Basophils Absolute: 0.1 10*3/uL (ref 0.0–0.1)
Basophils Relative: 1 %
Eosinophils Absolute: 0.4 10*3/uL (ref 0.0–0.5)
Eosinophils Relative: 5 %
HCT: 41.3 % (ref 39.0–52.0)
Hemoglobin: 13.6 g/dL (ref 13.0–17.0)
Immature Granulocytes: 1 %
Lymphocytes Relative: 11 %
Lymphs Abs: 1.1 10*3/uL (ref 0.7–4.0)
MCH: 28.9 pg (ref 26.0–34.0)
MCHC: 32.9 g/dL (ref 30.0–36.0)
MCV: 87.9 fL (ref 80.0–100.0)
Monocytes Absolute: 0.8 10*3/uL (ref 0.1–1.0)
Monocytes Relative: 8 %
Neutro Abs: 7.1 10*3/uL (ref 1.7–7.7)
Neutrophils Relative %: 74 %
Platelet Count: 265 10*3/uL (ref 150–400)
RBC: 4.7 MIL/uL (ref 4.22–5.81)
RDW: 13 % (ref 11.5–15.5)
WBC Count: 9.6 10*3/uL (ref 4.0–10.5)
nRBC: 0 % (ref 0.0–0.2)

## 2021-07-18 LAB — CMP (CANCER CENTER ONLY)
ALT: 8 U/L (ref 0–44)
AST: 14 U/L — ABNORMAL LOW (ref 15–41)
Albumin: 3.9 g/dL (ref 3.5–5.0)
Alkaline Phosphatase: 86 U/L (ref 38–126)
Anion gap: 6 (ref 5–15)
BUN: 17 mg/dL (ref 8–23)
CO2: 30 mmol/L (ref 22–32)
Calcium: 10.9 mg/dL — ABNORMAL HIGH (ref 8.9–10.3)
Chloride: 100 mmol/L (ref 98–111)
Creatinine: 1.05 mg/dL (ref 0.61–1.24)
GFR, Estimated: >60 mL/min (ref >60)
Glucose, Bld: 119 mg/dL — ABNORMAL HIGH (ref 70–99)
Potassium: 4.1 mmol/L (ref 3.5–5.1)
Sodium: 136 mmol/L (ref 135–145)
Total Bilirubin: 0.4 mg/dL (ref 0.3–1.2)
Total Protein: 7.5 g/dL (ref 6.5–8.1)

## 2021-07-18 LAB — TSH: TSH: 0.966 u[IU]/mL (ref 0.320–4.118)

## 2021-07-18 MED ORDER — SODIUM CHLORIDE 0.9 % IV SOLN
150.0000 mg | Freq: Once | INTRAVENOUS | Status: AC
  Administered 2021-07-18: 150 mg via INTRAVENOUS
  Filled 2021-07-18: qty 150

## 2021-07-18 MED ORDER — SODIUM CHLORIDE 0.9 % IV SOLN: Freq: Once | INTRAVENOUS | Status: AC

## 2021-07-18 MED ORDER — SODIUM CHLORIDE 0.9 % IV SOLN
10.0000 mg | Freq: Once | INTRAVENOUS | Status: AC
  Administered 2021-07-18: 10 mg via INTRAVENOUS
  Filled 2021-07-18: qty 10

## 2021-07-18 MED ORDER — FAMOTIDINE IN NACL 20-0.9 MG/50ML-% IV SOLN
20.0000 mg | Freq: Once | INTRAVENOUS | Status: AC
  Administered 2021-07-18: 20 mg via INTRAVENOUS
  Filled 2021-07-18: qty 50

## 2021-07-18 MED ORDER — SODIUM CHLORIDE 0.9 % IV SOLN
350.0000 mg | Freq: Once | INTRAVENOUS | Status: AC
  Administered 2021-07-18: 350 mg via INTRAVENOUS
  Filled 2021-07-18: qty 7

## 2021-07-18 MED ORDER — SODIUM CHLORIDE 0.9 % IV SOLN
440.0000 mg | Freq: Once | INTRAVENOUS | Status: AC
  Administered 2021-07-18: 440 mg via INTRAVENOUS
  Filled 2021-07-18: qty 44

## 2021-07-18 MED ORDER — HEPARIN SOD (PORK) LOCK FLUSH 100 UNIT/ML IV SOLN: 500.0000 [IU] | Freq: Once | INTRAVENOUS | Status: DC | PRN

## 2021-07-18 MED ORDER — PALONOSETRON HCL INJECTION 0.25 MG/5ML
0.2500 mg | Freq: Once | INTRAVENOUS | Status: AC
  Administered 2021-07-18: 0.25 mg via INTRAVENOUS
  Filled 2021-07-18: qty 5

## 2021-07-18 MED ORDER — DIPHENHYDRAMINE HCL 50 MG/ML IJ SOLN
50.0000 mg | Freq: Once | INTRAMUSCULAR | Status: AC
  Administered 2021-07-18: 50 mg via INTRAVENOUS
  Filled 2021-07-18: qty 1

## 2021-07-18 MED ORDER — SODIUM CHLORIDE 0.9 % IV SOLN
175.0000 mg/m2 | Freq: Once | INTRAVENOUS | Status: AC
  Administered 2021-07-18: 354 mg via INTRAVENOUS
  Filled 2021-07-18: qty 59

## 2021-07-18 MED ORDER — SODIUM CHLORIDE 0.9% FLUSH: 10.0000 mL | INTRAVENOUS | Status: DC | PRN

## 2021-07-18 MED ORDER — SODIUM CHLORIDE 0.9% FLUSH
10.0000 mL | INTRAVENOUS | Status: DC | PRN
  Administered 2021-07-18: 10 mL via INTRAVENOUS

## 2021-07-18 NOTE — Patient Instructions (Addendum)
[facility]  Discharge Instructions: ?Thank you for choosing [facility] to provide your oncology and hematology care.  ? ?If you have a lab appointment with the [facility], please go directly to the [facility] and check in at the registration area. ?  ?Wear comfortable clothing and clothing appropriate for easy access to any Portacath or PICC line.  ? ?We strive to give you quality time with your provider. You may need to reschedule your appointment if you arrive late (15 or more minutes).  Arriving late affects you and other patients whose appointments are after yours.  Also, if you miss three or more appointments without notifying the office, you may be dismissed from the clinic at the provider?s discretion.    ?  ?For prescription refill requests, have your pharmacy contact our office and allow 72 hours for refills to be completed.   ? ?Today you received the following chemotherapy and/or immunotherapy agents: Libtayo, Paclitaxel, Carboplatin.     ?  ?To help prevent nausea and vomiting after your treatment, we encourage you to take your nausea medication as directed. ? ?BELOW ARE SYMPTOMS THAT SHOULD BE REPORTED IMMEDIATELY: ?*FEVER GREATER THAN 100.4 F (38 ?C) OR HIGHER ?*CHILLS OR SWEATING ?*NAUSEA AND VOMITING THAT IS NOT CONTROLLED WITH YOUR NAUSEA MEDICATION ?*UNUSUAL SHORTNESS OF BREATH ?*UNUSUAL BRUISING OR BLEEDING ?*URINARY PROBLEMS (pain or burning when urinating, or frequent urination) ?*BOWEL PROBLEMS (unusual diarrhea, constipation, pain near the anus) ?TENDERNESS IN MOUTH AND THROAT WITH OR WITHOUT PRESENCE OF ULCERS (sore throat, sores in mouth, or a toothache) ?UNUSUAL RASH, SWELLING OR PAIN  ?UNUSUAL VAGINAL DISCHARGE OR ITCHING  ? ?Items with * indicate a potential emergency and should be followed up as soon as possible or go to the Emergency Department if any problems should occur. ? ?Please show the CHEMOTHERAPY ALERT CARD or IMMUNOTHERAPY  ALERT CARD at check-in to the Emergency Department and triage nurse. ? ?Should you have questions after your visit or need to cancel or reschedule your appointment, please contact [facility]  Dept: [phone]  and follow the prompts.  Office hours are 8:00 a.m. to 4:30 p.m. Monday - Friday. Please note that voicemails left after 4:00 p.m. may not be returned until the following business day.  We are closed weekends and major holidays. You have access to a nurse at all times for urgent questions. Please call the main number to the clinic Dept: [phone] and follow the prompts. ? ? ?For any non-urgent questions, you may also contact your provider using MyChart. We now offer e-Visits for anyone 21 and older to request care online for non-urgent symptoms. For details visit [email or web address]. ?  ?Also download the MyChart app! Go to the app store, search "MyChart", open the app, select [facility], and log in with your MyChart username and password. ? ?Due to Covid, a mask is required upon entering the hospital/clinic. If you do not have a mask, one will be given to you upon arrival. For doctor visits, patients may have 1 support person aged 1 or older with them. For treatment visits, patients cannot have anyone with them due to current Covid guidelines and our immunocompromised population.  ? ?Cemiplimab injection ?What is this medication? ?CEMIPLIMAB (se mip li mab) is a monoclonal antibody. It treats certain types of cancer. Some of the cancers treated are cutaneous squamous cell carcinoma and basal cell carcinoma. ?This medicine may be used for other purposes; ask your health  care provider or pharmacist if you have questions. ?COMMON BRAND NAME(S): LIBTAYO ?What should I tell my care team before I take this medication? ?They need to know if you have any of these conditions: ?autoimmune diseases like Crohn's disease, ulcerative colitis, or lupus ?have had or planning to have  an allogeneic stem cell transplant (uses someone else's stem cells) ?history of organ transplant ?nervous system problems like myasthenia gravis or Guillain-Barre syndrome ?an unusual or allergic reaction to cemiplimab, other drugs, foods, dyes, or preservatives ?pregnant or trying to get pregnant ?breast-feeding ?How should I use this medication? ?This medicine is for infusion into a vein. It is given by a health care professional in a hospital or clinic setting. ?A special MedGuide will be given to you before each treatment. Be sure to read this information carefully each time. ?Talk to your pediatrician regarding the use of this medicine in children. Special care may be needed. ?Overdosage: If you think you have taken too much of this medicine contact a poison control center or emergency room at once. ?NOTE: This medicine is only for you. Do not share this medicine with others. ?What if I miss a dose? ?It is important not to miss your dose. Call your doctor or health care professional if you are unable to keep an appointment. ?What may interact with this medication? ?Interactions have not been studied. ?This list may not describe all possible interactions. Give your health care provider a list of all the medicines, herbs, non-prescription drugs, or dietary supplements you use. Also tell them if you smoke, drink alcohol, or use illegal drugs. Some items may interact with your medicine. ?What should I watch for while using this medication? ?Your condition will be monitored carefully while you are receiving this medicine. ?You may need blood work done while you are taking this medicine. ?Do not become pregnant while taking this medicine or for at least 4 months after stopping it. Women should inform their doctor if they wish to become pregnant or think they might be pregnant. There is a potential for serious side effects to an unborn child. Talk to your health care professional or pharmacist for more information. Do  not breast-feed an infant while taking this medicine or for at least 4 months after the last dose. ?What side effects may I notice from receiving this medication? ?Side effects that you should report to your doctor or health care professional as soon as possible: ?allergic reactions like skin rash, itching or hives; swelling of the face, lips, or tongue ?black, tarry stools ?bloody or watery diarrhea ?breathing problems ?changes in vision ?changes in voice ?chest pain or chest tightness ?chills ?cough ?dizziness ?fast or irregular heart beat ?feeling faint or lightheaded ?hair loss ?increased hunger or thirst ?muscle weakness ?persistent headache ?redness, blistering, peeling or loosening of the skin, including inside the mouth ?signs and symptoms of kidney injury like trouble passing urine or change in the amount of urine ?signs and symptoms of liver injury like dark yellow or brown urine; general ill feeling or flu-like symptoms; light-colored stools; loss of appetite; nausea; right upper belly pain; unusually weak or tired; yellowing of the eyes or skin ?stomach pain ?unusual bleeding or bruising ?weight gain or weight loss ?unusual sweating ?Side effects that usually do not require medical attention (report these to your doctor or health care professional if they continue or are bothersome): ?bone pain ?constipation ?muscle pain ?tiredness ?This list may not describe all possible side effects. Call your doctor for medical advice  about side effects. You may report side effects to FDA at 1-800-FDA-1088. ?Where should I keep my medication? ?This drug is given in a hospital or clinic and will not be stored at home. ?NOTE: This sheet is a summary. It may not cover all possible information. If you have questions about this medicine, talk to your doctor, pharmacist, or health care provider. ?? 2022 Elsevier/Gold Standard (2020-12-11 00:00:00) ? ?Paclitaxel injection ?What is this medication? ?PACLITAXEL (PAK li TAX el)  is a chemotherapy drug. It targets fast dividing cells, like cancer cells, and causes these cells to die. This medicine is used to treat ovarian cancer, breast cancer, lung cancer, Kaposi's sarcoma,

## 2021-07-19 ENCOUNTER — Encounter: Payer: Self-pay | Admitting: *Deleted

## 2021-07-19 ENCOUNTER — Telehealth: Payer: Self-pay | Admitting: *Deleted

## 2021-07-19 NOTE — Progress Notes (Signed)
Oncology Nurse Navigator Documentation ? ? ?  07/19/2021  ?  3:00 PM 07/16/2021  ? 10:00 AM 07/15/2021  ?  8:00 AM 07/11/2021  ? 12:00 PM 07/11/2021  ? 10:00 AM  ?Oncology Nurse Navigator Flowsheets  ?Abnormal Finding Date     06/07/2021  ?Confirmed Diagnosis Date     07/02/2021  ?Diagnosis Status   Confirmed Diagnosis Complete  Pending Molecular Studies  ?Planned Course of Treatment   Chemo/Radiation Concurrent    ?Phase of Treatment  Radiation Radiation    ?Chemotherapy Actual Start Date:   07/18/2021    ?Radiation Actual Start Date:  07/22/2021     ?Navigator Follow Up Date:   07/24/2021  07/15/2021  ?Navigator Follow Up Reason:   Follow-up Appointment  Appointment Review  ?Navigator Location [facility]  ?Referral Date to RadOnc/MedOnc     07/10/2021  ?Navigator Encounter Type Appt/Treatment Plan Review Appt/Treatment Plan Review Appt/Treatment Plan Review  Appt/Treatment Plan Review  ?Treatment Initiated Date   07/18/2021    ?Patient Visit Type   Other Other MedOnc;Initial  ?Treatment Phase Treatment Pre-Tx/Tx Discussion Pre-Tx/Tx Discussion Pre-Tx/Tx Discussion Pre-Tx/Tx Discussion  ?Barriers/Navigation Needs Coordination of Care Coordination of Care Coordination of Care Coordination of Care Coordination of Care  ?Interventions Coordination of Care/radiation and systemic therapy tx plan and schedule are set up at this time.  Coordination of Care Coordination of Care Coordination of Care Coordination of Care  ?Acuity Level 2-Minimal Needs (1-2 Barriers Identified) Level 2-Minimal Needs (1-2 Barriers Identified) Level 2-Minimal Needs (1-2 Barriers Identified) Level 2-Minimal Needs (1-2 Barriers Identified) Level 2-Minimal Needs (1-2 Barriers Identified)  ?Coordination of Care Other Other Other Pathology Pathology  ?Time Spent with Patient 30 30 30 30 30   ?  ?

## 2021-07-19 NOTE — Telephone Encounter (Signed)
Called pt to see how he did with his treatment yesterday.  He reports doing well without c/o's.  He knows his next appt &  how to reach [place] if needed.  Pt expressed appreciation for call.  ?

## 2021-07-19 NOTE — Telephone Encounter (Signed)
-----   Message from Rafael Bihari, RN sent at 07/18/2021  3:52 PM EDT ----- ?Regarding: Dr. Julien Nordmann, first time Libtayo, Carboplatin, Paclitaxel. ?Dr. Julien Nordmann pt came in 4/13 for first time Libtayo/Carboplatin/Paclitaxel. Tolerated all infusions w/out issue. Needs call back. ? ?

## 2021-07-20 ENCOUNTER — Inpatient Hospital Stay: Payer: Medicare Other

## 2021-07-20 VITALS — BP 124/67 | HR 79 | Temp 97.3°F | Resp 16

## 2021-07-20 DIAGNOSIS — Z5111 Encounter for antineoplastic chemotherapy: Secondary | ICD-10-CM | POA: Diagnosis not present

## 2021-07-20 DIAGNOSIS — C7989 Secondary malignant neoplasm of other specified sites: Secondary | ICD-10-CM | POA: Diagnosis not present

## 2021-07-20 DIAGNOSIS — C3432 Malignant neoplasm of lower lobe, left bronchus or lung: Secondary | ICD-10-CM | POA: Diagnosis not present

## 2021-07-20 DIAGNOSIS — C3492 Malignant neoplasm of unspecified part of left bronchus or lung: Secondary | ICD-10-CM

## 2021-07-20 DIAGNOSIS — Z510 Encounter for antineoplastic radiation therapy: Secondary | ICD-10-CM | POA: Diagnosis not present

## 2021-07-20 DIAGNOSIS — C7951 Secondary malignant neoplasm of bone: Secondary | ICD-10-CM | POA: Diagnosis not present

## 2021-07-20 DIAGNOSIS — Z5112 Encounter for antineoplastic immunotherapy: Secondary | ICD-10-CM | POA: Diagnosis not present

## 2021-07-20 MED ORDER — PEGFILGRASTIM-CBQV 6 MG/0.6ML ~~LOC~~ SOSY
6.0000 mg | PREFILLED_SYRINGE | Freq: Once | SUBCUTANEOUS | Status: AC
  Administered 2021-07-20: 6 mg via SUBCUTANEOUS
  Filled 2021-07-20: qty 0.6

## 2021-07-20 NOTE — Patient Instructions (Signed)

## 2021-07-22 ENCOUNTER — Ambulatory Visit
Admission: RE | Admit: 2021-07-22 | Discharge: 2021-07-22 | Disposition: A | Discharge status: Home / Self Care | Payer: Medicare Other | Source: Ambulatory Visit | Attending: Radiation Oncology | Admitting: Radiation Oncology

## 2021-07-22 ENCOUNTER — Other Ambulatory Visit: Payer: Self-pay

## 2021-07-22 DIAGNOSIS — Z5112 Encounter for antineoplastic immunotherapy: Secondary | ICD-10-CM | POA: Diagnosis not present

## 2021-07-22 DIAGNOSIS — Z5111 Encounter for antineoplastic chemotherapy: Secondary | ICD-10-CM | POA: Diagnosis not present

## 2021-07-22 DIAGNOSIS — C3432 Malignant neoplasm of lower lobe, left bronchus or lung: Secondary | ICD-10-CM | POA: Diagnosis not present

## 2021-07-22 DIAGNOSIS — C3492 Malignant neoplasm of unspecified part of left bronchus or lung: Secondary | ICD-10-CM

## 2021-07-22 DIAGNOSIS — C7951 Secondary malignant neoplasm of bone: Secondary | ICD-10-CM | POA: Diagnosis not present

## 2021-07-22 DIAGNOSIS — C7989 Secondary malignant neoplasm of other specified sites: Secondary | ICD-10-CM | POA: Diagnosis not present

## 2021-07-22 DIAGNOSIS — F17210 Nicotine dependence, cigarettes, uncomplicated: Secondary | ICD-10-CM | POA: Diagnosis not present

## 2021-07-22 DIAGNOSIS — Z510 Encounter for antineoplastic radiation therapy: Secondary | ICD-10-CM | POA: Diagnosis not present

## 2021-07-23 ENCOUNTER — Ambulatory Visit
Admission: RE | Admit: 2021-07-23 | Discharge: 2021-07-23 | Disposition: A | Discharge status: Home / Self Care | Payer: Medicare Other | Source: Ambulatory Visit | Attending: Radiation Oncology | Admitting: Radiation Oncology

## 2021-07-23 ENCOUNTER — Other Ambulatory Visit: Payer: Self-pay

## 2021-07-23 DIAGNOSIS — Z510 Encounter for antineoplastic radiation therapy: Secondary | ICD-10-CM | POA: Diagnosis not present

## 2021-07-23 DIAGNOSIS — C7951 Secondary malignant neoplasm of bone: Secondary | ICD-10-CM | POA: Diagnosis not present

## 2021-07-23 DIAGNOSIS — Z5111 Encounter for antineoplastic chemotherapy: Secondary | ICD-10-CM | POA: Diagnosis not present

## 2021-07-23 DIAGNOSIS — Z5112 Encounter for antineoplastic immunotherapy: Secondary | ICD-10-CM | POA: Diagnosis not present

## 2021-07-23 DIAGNOSIS — F17210 Nicotine dependence, cigarettes, uncomplicated: Secondary | ICD-10-CM | POA: Diagnosis not present

## 2021-07-23 DIAGNOSIS — C3492 Malignant neoplasm of unspecified part of left bronchus or lung: Secondary | ICD-10-CM

## 2021-07-23 DIAGNOSIS — C3432 Malignant neoplasm of lower lobe, left bronchus or lung: Secondary | ICD-10-CM | POA: Diagnosis not present

## 2021-07-23 DIAGNOSIS — C7989 Secondary malignant neoplasm of other specified sites: Secondary | ICD-10-CM | POA: Diagnosis not present

## 2021-07-23 LAB — RAD ONC ARIA SESSION SUMMARY
Course Elapsed Days: 1
Plan Fractions Treated to Date: 2
Plan Prescribed Dose Per Fraction: 3 Gy
Plan Total Fractions Prescribed: 10
Plan Total Prescribed Dose: 30 Gy
Reference Point Dosage Given to Date: 6 Gy
Reference Point Session Dosage Given: 3 Gy
Session Number: 2

## 2021-07-24 ENCOUNTER — Other Ambulatory Visit: Payer: Medicare Other

## 2021-07-24 ENCOUNTER — Ambulatory Visit
Admission: RE | Admit: 2021-07-24 | Discharge: 2021-07-24 | Disposition: A | Discharge status: Home / Self Care | Payer: Medicare Other | Source: Ambulatory Visit | Attending: Radiation Oncology | Admitting: Radiation Oncology

## 2021-07-24 ENCOUNTER — Other Ambulatory Visit: Payer: Self-pay

## 2021-07-24 ENCOUNTER — Inpatient Hospital Stay: Payer: Medicare Other

## 2021-07-24 ENCOUNTER — Telehealth: Payer: Self-pay | Admitting: Pulmonary Disease

## 2021-07-24 ENCOUNTER — Inpatient Hospital Stay (HOSPITAL_BASED_OUTPATIENT_CLINIC_OR_DEPARTMENT_OTHER): Payer: Medicare Other | Admitting: Internal Medicine

## 2021-07-24 VITALS — BP 137/67 | HR 85 | Temp 97.6°F | Resp 18 | Ht 74.0 in | Wt 166.6 lb

## 2021-07-24 DIAGNOSIS — Z95828 Presence of other vascular implants and grafts: Secondary | ICD-10-CM

## 2021-07-24 DIAGNOSIS — G893 Neoplasm related pain (acute) (chronic): Secondary | ICD-10-CM

## 2021-07-24 DIAGNOSIS — C3492 Malignant neoplasm of unspecified part of left bronchus or lung: Secondary | ICD-10-CM | POA: Diagnosis not present

## 2021-07-24 DIAGNOSIS — C7951 Secondary malignant neoplasm of bone: Secondary | ICD-10-CM | POA: Diagnosis not present

## 2021-07-24 DIAGNOSIS — Z5111 Encounter for antineoplastic chemotherapy: Secondary | ICD-10-CM

## 2021-07-24 DIAGNOSIS — F17210 Nicotine dependence, cigarettes, uncomplicated: Secondary | ICD-10-CM | POA: Diagnosis not present

## 2021-07-24 DIAGNOSIS — C3432 Malignant neoplasm of lower lobe, left bronchus or lung: Secondary | ICD-10-CM | POA: Diagnosis not present

## 2021-07-24 DIAGNOSIS — Z5112 Encounter for antineoplastic immunotherapy: Secondary | ICD-10-CM | POA: Diagnosis not present

## 2021-07-24 DIAGNOSIS — Z510 Encounter for antineoplastic radiation therapy: Secondary | ICD-10-CM | POA: Diagnosis not present

## 2021-07-24 DIAGNOSIS — C7989 Secondary malignant neoplasm of other specified sites: Secondary | ICD-10-CM | POA: Diagnosis not present

## 2021-07-24 LAB — RAD ONC ARIA SESSION SUMMARY
Course Elapsed Days: 2
Plan Fractions Treated to Date: 3
Plan Prescribed Dose Per Fraction: 3 Gy
Plan Total Fractions Prescribed: 10
Plan Total Prescribed Dose: 30 Gy
Reference Point Dosage Given to Date: 9 Gy
Reference Point Session Dosage Given: 3 Gy
Session Number: 3

## 2021-07-24 LAB — CMP (CANCER CENTER ONLY)
ALT: 12 U/L (ref 0–44)
AST: 14 U/L — ABNORMAL LOW (ref 15–41)
Albumin: 3.4 g/dL — ABNORMAL LOW (ref 3.5–5.0)
Alkaline Phosphatase: 96 U/L (ref 38–126)
Anion gap: 7 (ref 5–15)
BUN: 14 mg/dL (ref 8–23)
CO2: 28 mmol/L (ref 22–32)
Calcium: 9.3 mg/dL (ref 8.9–10.3)
Chloride: 100 mmol/L (ref 98–111)
Creatinine: 0.89 mg/dL (ref 0.61–1.24)
GFR, Estimated: >60 mL/min (ref >60)
Glucose, Bld: 91 mg/dL (ref 70–99)
Potassium: 3.9 mmol/L (ref 3.5–5.1)
Sodium: 135 mmol/L (ref 135–145)
Total Bilirubin: 0.6 mg/dL (ref 0.3–1.2)
Total Protein: 6.8 g/dL (ref 6.5–8.1)

## 2021-07-24 LAB — CBC WITH DIFFERENTIAL (CANCER CENTER ONLY)
Abs Immature Granulocytes: 0.39 10*3/uL — ABNORMAL HIGH (ref 0.00–0.07)
Basophils Absolute: 0.1 10*3/uL (ref 0.0–0.1)
Basophils Relative: 2 %
Eosinophils Absolute: 0.4 10*3/uL (ref 0.0–0.5)
Eosinophils Relative: 7 %
HCT: 38.6 % — ABNORMAL LOW (ref 39.0–52.0)
Hemoglobin: 12.8 g/dL — ABNORMAL LOW (ref 13.0–17.0)
Immature Granulocytes: 7 %
Lymphocytes Relative: 13 %
Lymphs Abs: 0.7 10*3/uL (ref 0.7–4.0)
MCH: 29 pg (ref 26.0–34.0)
MCHC: 33.2 g/dL (ref 30.0–36.0)
MCV: 87.5 fL (ref 80.0–100.0)
Monocytes Absolute: 0.5 10*3/uL (ref 0.1–1.0)
Monocytes Relative: 9 %
Neutro Abs: 3.4 10*3/uL (ref 1.7–7.7)
Neutrophils Relative %: 62 %
Platelet Count: 123 10*3/uL — ABNORMAL LOW (ref 150–400)
RBC: 4.41 MIL/uL (ref 4.22–5.81)
RDW: 13 % (ref 11.5–15.5)
WBC Count: 5.5 10*3/uL (ref 4.0–10.5)
nRBC: 0 % (ref 0.0–0.2)

## 2021-07-24 MED ORDER — HEPARIN SOD (PORK) LOCK FLUSH 100 UNIT/ML IV SOLN
500.0000 [IU] | INTRAVENOUS | Status: AC | PRN
  Administered 2021-07-24: 500 [IU]

## 2021-07-24 MED ORDER — SODIUM CHLORIDE 0.9% FLUSH
10.0000 mL | INTRAVENOUS | Status: AC | PRN
  Administered 2021-07-24: 10 mL

## 2021-07-24 NOTE — Progress Notes (Signed)
?    [facility] ?[phone]   [phone] ? ?OFFICE PROGRESS NOTE ? ?[name], PA-C ?[address] ?[place] [address] ? ?DIAGNOSIS: Stage IV (T3, N1, M1b) non-small cell lung cancer favoring squamous cell carcinoma presented with superior segment left lower lobe lung mass with invasion of the chest wall and destruction of the posterior left sixth and seventh ribs as well as left hilar adenopathy and suspicious pleural metastasis diagnosed in March 2023 ? ?PRIOR THERAPY: Palliative radiotherapy to the left lower lobe lung mass with chest wall invasion under the care of Dr. Sondra Come ? ?CURRENT THERAPY: Palliative systemic chemotherapy with carboplatin for AUC of 5, paclitaxel 175 Mg/M2 and Libtayo (Cempilimab) 350 Mg IV every 3 weeks with Neulasta support.  Status post 1 cycle.  First dose was given on July 18, 2021 ? ?INTERVAL HISTORY: ?Edward Beck 76 y.o. male returns to the clinic today for follow-up visit accompanied by his wife.  The patient is feeling fine today with no concerning complaints.  He tolerated the first week of his treatment fairly well with no concerning adverse effect except for fatigue and aching pain after the Neulasta injection.  He denied having any current cough, shortness of breath or hemoptysis.  He has occasional left-sided chest pain.  He has no nausea, vomiting, diarrhea or constipation.  He has no headache or visual changes.  He is here today for evaluation and repeat blood work. ? ?MEDICAL HISTORY: ?Past Medical History:  ?Diagnosis Date  ? Condyloma acuminatum of scrotum   ? COVID 2021  ? December 2022 - mild  ? Lung cancer ([place])   ? Pneumonia   ? Scrotal lesion   ? ? ?ALLERGIES:  is allergic to tamiflu [oseltamivir phosphate]. ? ?MEDICATIONS:  ?Current Outpatient Medications  ?Medication Sig Dispense Refill  ? finasteride (PROSCAR) 5 MG tablet Take 1 tablet (5 mg total) by mouth daily. 90 tablet 3  ? HYDROcodone-acetaminophen  (NORCO) 10-325 MG tablet Take 1-2 tablets by mouth every 6 (six) hours as needed. 45 tablet 0  ? lidocaine-prilocaine (EMLA) cream Apply to the Port-A-Cath site 30 minutes before chemotherapy. (Patient not taking: Reported on 07/15/2021) 30 g 0  ? naproxen sodium (ALEVE) 220 MG tablet Take 220 mg by mouth 2 (two) times daily as needed (pain).    ? prochlorperazine (COMPAZINE) 10 MG tablet Take 1 tablet (10 mg total) by mouth every 6 (six) hours as needed for nausea or vomiting. (Patient not taking: Reported on 07/15/2021) 30 tablet 0  ? triamcinolone cream (KENALOG) 0.1 % Apply 1 application. topically 2 (two) times daily as needed (dry skin).    ? umeclidinium-vilanterol (ANORO ELLIPTA) 62.5-25 MCG/ACT AEPB Inhale 1 puff into the lungs daily. (Patient not taking: Reported on 07/15/2021) 7 each 0  ? ?No current facility-administered medications for this visit.  ? ? ?SURGICAL HISTORY:  ?Past Surgical History:  ?Procedure Laterality Date  ? BRONCHIAL BIOPSY  07/02/2021  ? Procedure: BRONCHIAL BIOPSIES;  Surgeon: Garner Nash, DO;  Location: [facility];  Service: Pulmonary;;  ? BRONCHIAL BRUSHINGS  07/02/2021  ? Procedure: BRONCHIAL BRUSHINGS;  Surgeon: Garner Nash, DO;  Location: [facility];  Service: Pulmonary;;  ? BRONCHIAL NEEDLE ASPIRATION BIOPSY  07/02/2021  ? Procedure: BRONCHIAL NEEDLE ASPIRATION BIOPSIES;  Surgeon: Garner Nash, DO;  Location: [facility];  Service: Pulmonary;;  ? HERNIA REPAIR  2009  ? left inguinal  ? IR IMAGING GUIDED PORT INSERTION  07/16/2021  ? SCROTAL  EXPLORATION  11/21/2011  ? Procedure: SCROTUM EXPLORATION;  Surgeon: Hanley Ben, MD;  Location: [facility];  Service: Urology;  Laterality: N/A;  EXCISION, BIOPSY SCROTAL CONDYLOMA ?  ? VIDEO BRONCHOSCOPY WITH ENDOBRONCHIAL ULTRASOUND Bilateral 07/02/2021  ? Procedure: VIDEO BRONCHOSCOPY WITH ENDOBRONCHIAL ULTRASOUND;  Surgeon: Garner Nash, DO;  Location: [facility];  Service: Pulmonary;  Laterality: Bilateral;   ? ? ?REVIEW OF SYSTEMS:  A comprehensive review of systems was negative except for: Constitutional: positive for fatigue ?Respiratory: positive for pleurisy/chest pain  ? ?PHYSICAL EXAMINATION: General appearance: alert, cooperative, fatigued, and no distress ?Head: Normocephalic, without obvious abnormality, atraumatic ?Neck: no adenopathy, no JVD, supple, symmetrical, trachea midline, and thyroid not enlarged, symmetric, no tenderness/mass/nodules ?Lymph nodes: Cervical, supraclavicular, and axillary nodes normal. ?Resp: clear to auscultation bilaterally ?Back: symmetric, no curvature. ROM normal. No CVA tenderness. ?Cardio: regular rate and rhythm, S1, S2 normal, no murmur, click, rub or gallop ?GI: soft, non-tender; bowel sounds normal; no masses,  no organomegaly ?Extremities: extremities normal, atraumatic, no cyanosis or edema ? ?ECOG PERFORMANCE STATUS: 1 - Symptomatic but completely ambulatory ? ?Blood pressure 137/67, pulse 85, temperature 97.6 ?F (36.4 ?C), temperature source Tympanic, resp. rate 18, height 6\' 2"  (1.88 m), weight 166 lb 9.6 oz (75.6 kg), SpO2 99 %. ? ?LABORATORY DATA: ?Lab Results  ?Component Value Date  ? WBC 5.5 07/24/2021  ? HGB 12.8 (L) 07/24/2021  ? HCT 38.6 (L) 07/24/2021  ? MCV 87.5 07/24/2021  ? PLT 123 (L) 07/24/2021  ? ? ?  Chemistry   ?   ?Component Value Date/Time  ? NA 136 07/18/2021 0736  ? K 4.1 07/18/2021 0736  ? CL 100 07/18/2021 0736  ? CO2 30 07/18/2021 0736  ? BUN 17 07/18/2021 0736  ? CREATININE 1.05 07/18/2021 0736  ?    ?Component Value Date/Time  ? CALCIUM 10.9 (H) 07/18/2021 0736  ? ALKPHOS 86 07/18/2021 0736  ? AST 14 (L) 07/18/2021 0736  ? ALT 8 07/18/2021 0736  ? BILITOT 0.4 07/18/2021 0736  ?  ? ? ? ?RADIOGRAPHIC STUDIES: ?MR BRAIN W WO CONTRAST ? ?Result Date: 07/11/2021 ?CLINICAL DATA:  Provided history: Lung mass. Lung nodule. EXAM: MRI HEAD WITHOUT AND WITH CONTRAST TECHNIQUE: Multiplanar, multiecho pulse sequences of the brain and surrounding structures  were obtained without and with intravenous contrast. CONTRAST:  7.54mL GADAVIST GADOBUTROL 1 MMOL/ML IV SOLN COMPARISON:  No pertinent prior exams available for comparison. FINDINGS: Brain: Mild generalized parenchymal atrophy. Mild multifocal T2 FLAIR hyperintense signal abnormality within the cerebral white matter and pons, nonspecific but compatible with chronic small vessel ischemic disease. There is no acute infarct. No evidence of an intracranial mass. No chronic intracranial blood products. No extra-axial fluid collection. No midline shift. No pathologic intracranial enhancement identified. Vascular: Maintained flow voids within the proximal large arterial vessels. Left frontal lobe developmental venous anomaly (anatomic variant). Skull and upper cervical spine: Nonspecific 8 mm T2 FLAIR hyperintense and enhancing lesion within the left temporoparietal calvarium (series 11, image 27) (series 16, image 73). Sinuses/Orbits: Visualized orbits show no acute finding. Trace mucosal thickening within the bilateral ethmoid sinuses. Impression #2 will be called to the ordering clinician or representative by the Radiologist Assistant, and communication documented in the PACS or [employer]. IMPRESSION: No evidence of intracranial metastatic disease. Nonspecific 8 mm enhancing lesion within the left temporoparietal calvarium. Given the appearance, this is favored to reflect a benign lesion. However, an osseous metastasis cannot be excluded and a short-interval 3 month follow-up brain MRI  with contrast is recommended to ensure stability. Mild chronic small vessel ischemic changes within the cerebral white matter and pons. Mild generalized parenchymal atrophy. Electronically Signed   By: Kellie Simmering D.O.   On: 07/11/2021 18:16  ? ?DG Chest Port 1 View ? ?Result Date: 07/02/2021 ?CLINICAL DATA:  Status post bronchoscopy with biopsy. EXAM: PORTABLE CHEST 1 VIEW COMPARISON:  June 21, 2021. FINDINGS: Stable  cardiomediastinal silhouette. Right lung is clear. No pneumothorax is noted. Stable loculated left pleural effusion is noted with left basilar mass and scarring or atelectasis. Bony thorax is unremarkable. IMPRESSION: Stable left

## 2021-07-25 ENCOUNTER — Ambulatory Visit
Admission: RE | Admit: 2021-07-25 | Discharge: 2021-07-25 | Disposition: A | Discharge status: Home / Self Care | Payer: Medicare Other | Source: Ambulatory Visit | Attending: Radiation Oncology | Admitting: Radiation Oncology

## 2021-07-25 ENCOUNTER — Telehealth: Payer: Self-pay

## 2021-07-25 ENCOUNTER — Other Ambulatory Visit: Payer: Self-pay | Admitting: Radiation Oncology

## 2021-07-25 ENCOUNTER — Other Ambulatory Visit: Payer: Self-pay

## 2021-07-25 DIAGNOSIS — C3432 Malignant neoplasm of lower lobe, left bronchus or lung: Secondary | ICD-10-CM | POA: Diagnosis not present

## 2021-07-25 DIAGNOSIS — F17210 Nicotine dependence, cigarettes, uncomplicated: Secondary | ICD-10-CM | POA: Diagnosis not present

## 2021-07-25 DIAGNOSIS — Z5112 Encounter for antineoplastic immunotherapy: Secondary | ICD-10-CM | POA: Diagnosis not present

## 2021-07-25 DIAGNOSIS — Z510 Encounter for antineoplastic radiation therapy: Secondary | ICD-10-CM | POA: Diagnosis not present

## 2021-07-25 DIAGNOSIS — Z5111 Encounter for antineoplastic chemotherapy: Secondary | ICD-10-CM | POA: Diagnosis not present

## 2021-07-25 DIAGNOSIS — C7989 Secondary malignant neoplasm of other specified sites: Secondary | ICD-10-CM | POA: Diagnosis not present

## 2021-07-25 DIAGNOSIS — C7951 Secondary malignant neoplasm of bone: Secondary | ICD-10-CM | POA: Diagnosis not present

## 2021-07-25 LAB — RAD ONC ARIA SESSION SUMMARY
Course Elapsed Days: 3
Plan Fractions Treated to Date: 4
Plan Prescribed Dose Per Fraction: 3 Gy
Plan Total Fractions Prescribed: 10
Plan Total Prescribed Dose: 30 Gy
Reference Point Dosage Given to Date: 12 Gy
Reference Point Session Dosage Given: 3 Gy
Session Number: 4

## 2021-07-25 MED ORDER — HYDROCODONE-ACETAMINOPHEN 10-325 MG PO TABS: 1.0000 | ORAL_TABLET | Freq: Four times a day (QID) | ORAL | 0 refills | Status: AC | PRN

## 2021-07-25 NOTE — Telephone Encounter (Signed)
Dr. Valeta Harms, please advise on this. ?

## 2021-07-25 NOTE — Telephone Encounter (Signed)
Received a call from [facility] pulmonology requesting that Dr. Sondra Come prescribe patients pain medication moving forward.  ? ? ?Patient request medications be sent to [facility] ?

## 2021-07-25 NOTE — Telephone Encounter (Signed)
Icard, Bradley L, DO  You; [facility] Triage Pool 9 minutes ago (3:59 PM)  ? ?Would be great if Dr. Sondra Come is willing to take over. If not, let me know.  ?Thanks  ?BLI  ? ? ? ?Called Dr. Clabe Seal office and spoke with Amy letting her know the info per [facility] and she verbalized understanding and said that she would let Dr. Sondra Come know. Nothing further needed. ?

## 2021-07-26 ENCOUNTER — Other Ambulatory Visit: Payer: Self-pay

## 2021-07-26 ENCOUNTER — Ambulatory Visit
Admission: RE | Admit: 2021-07-26 | Discharge: 2021-07-26 | Disposition: A | Discharge status: Home / Self Care | Payer: Medicare Other | Source: Ambulatory Visit | Attending: Radiation Oncology | Admitting: Radiation Oncology

## 2021-07-26 DIAGNOSIS — Z5111 Encounter for antineoplastic chemotherapy: Secondary | ICD-10-CM | POA: Diagnosis not present

## 2021-07-26 DIAGNOSIS — Z5112 Encounter for antineoplastic immunotherapy: Secondary | ICD-10-CM | POA: Diagnosis not present

## 2021-07-26 DIAGNOSIS — C7951 Secondary malignant neoplasm of bone: Secondary | ICD-10-CM | POA: Diagnosis not present

## 2021-07-26 DIAGNOSIS — Z510 Encounter for antineoplastic radiation therapy: Secondary | ICD-10-CM | POA: Diagnosis not present

## 2021-07-26 DIAGNOSIS — F17210 Nicotine dependence, cigarettes, uncomplicated: Secondary | ICD-10-CM | POA: Diagnosis not present

## 2021-07-26 DIAGNOSIS — C3432 Malignant neoplasm of lower lobe, left bronchus or lung: Secondary | ICD-10-CM | POA: Diagnosis not present

## 2021-07-26 DIAGNOSIS — C7989 Secondary malignant neoplasm of other specified sites: Secondary | ICD-10-CM | POA: Diagnosis not present

## 2021-07-26 LAB — RAD ONC ARIA SESSION SUMMARY
Course Elapsed Days: 4
Plan Fractions Treated to Date: 5
Plan Prescribed Dose Per Fraction: 3 Gy
Plan Total Fractions Prescribed: 10
Plan Total Prescribed Dose: 30 Gy
Reference Point Dosage Given to Date: 15 Gy
Reference Point Session Dosage Given: 3 Gy
Session Number: 5

## 2021-07-29 ENCOUNTER — Other Ambulatory Visit: Payer: Self-pay

## 2021-07-29 ENCOUNTER — Ambulatory Visit
Admission: RE | Admit: 2021-07-29 | Discharge: 2021-07-29 | Disposition: A | Discharge status: Home / Self Care | Payer: Medicare Other | Source: Ambulatory Visit | Attending: Radiation Oncology | Admitting: Radiation Oncology

## 2021-07-29 DIAGNOSIS — C7989 Secondary malignant neoplasm of other specified sites: Secondary | ICD-10-CM | POA: Diagnosis not present

## 2021-07-29 DIAGNOSIS — Z510 Encounter for antineoplastic radiation therapy: Secondary | ICD-10-CM | POA: Diagnosis not present

## 2021-07-29 DIAGNOSIS — Z5112 Encounter for antineoplastic immunotherapy: Secondary | ICD-10-CM | POA: Diagnosis not present

## 2021-07-29 DIAGNOSIS — C7951 Secondary malignant neoplasm of bone: Secondary | ICD-10-CM | POA: Diagnosis not present

## 2021-07-29 DIAGNOSIS — F17210 Nicotine dependence, cigarettes, uncomplicated: Secondary | ICD-10-CM | POA: Diagnosis not present

## 2021-07-29 DIAGNOSIS — C3432 Malignant neoplasm of lower lobe, left bronchus or lung: Secondary | ICD-10-CM | POA: Diagnosis not present

## 2021-07-29 DIAGNOSIS — Z5111 Encounter for antineoplastic chemotherapy: Secondary | ICD-10-CM | POA: Diagnosis not present

## 2021-07-29 LAB — RAD ONC ARIA SESSION SUMMARY
Course Elapsed Days: 7
Plan Fractions Treated to Date: 6
Plan Prescribed Dose Per Fraction: 3 Gy
Plan Total Fractions Prescribed: 10
Plan Total Prescribed Dose: 30 Gy
Reference Point Dosage Given to Date: 18 Gy
Reference Point Session Dosage Given: 3 Gy
Session Number: 6

## 2021-07-30 ENCOUNTER — Ambulatory Visit
Admission: RE | Admit: 2021-07-30 | Discharge: 2021-07-30 | Disposition: A | Discharge status: Home / Self Care | Payer: Medicare Other | Source: Ambulatory Visit | Attending: Radiation Oncology | Admitting: Radiation Oncology

## 2021-07-30 ENCOUNTER — Other Ambulatory Visit: Payer: Self-pay

## 2021-07-30 DIAGNOSIS — Z5111 Encounter for antineoplastic chemotherapy: Secondary | ICD-10-CM | POA: Diagnosis not present

## 2021-07-30 DIAGNOSIS — Z510 Encounter for antineoplastic radiation therapy: Secondary | ICD-10-CM | POA: Diagnosis not present

## 2021-07-30 DIAGNOSIS — C7989 Secondary malignant neoplasm of other specified sites: Secondary | ICD-10-CM | POA: Diagnosis not present

## 2021-07-30 DIAGNOSIS — C7951 Secondary malignant neoplasm of bone: Secondary | ICD-10-CM | POA: Diagnosis not present

## 2021-07-30 DIAGNOSIS — F17210 Nicotine dependence, cigarettes, uncomplicated: Secondary | ICD-10-CM | POA: Diagnosis not present

## 2021-07-30 DIAGNOSIS — Z5112 Encounter for antineoplastic immunotherapy: Secondary | ICD-10-CM | POA: Diagnosis not present

## 2021-07-30 DIAGNOSIS — C3432 Malignant neoplasm of lower lobe, left bronchus or lung: Secondary | ICD-10-CM | POA: Diagnosis not present

## 2021-07-30 LAB — RAD ONC ARIA SESSION SUMMARY
Course Elapsed Days: 8
Plan Fractions Treated to Date: 7
Plan Prescribed Dose Per Fraction: 3 Gy
Plan Total Fractions Prescribed: 10
Plan Total Prescribed Dose: 30 Gy
Reference Point Dosage Given to Date: 21 Gy
Reference Point Session Dosage Given: 3 Gy
Session Number: 7

## 2021-07-31 ENCOUNTER — Other Ambulatory Visit: Payer: Self-pay

## 2021-07-31 ENCOUNTER — Ambulatory Visit
Admission: RE | Admit: 2021-07-31 | Discharge: 2021-07-31 | Disposition: A | Discharge status: Home / Self Care | Payer: Medicare Other | Source: Ambulatory Visit | Attending: Radiation Oncology | Admitting: Radiation Oncology

## 2021-07-31 DIAGNOSIS — C7951 Secondary malignant neoplasm of bone: Secondary | ICD-10-CM | POA: Diagnosis not present

## 2021-07-31 DIAGNOSIS — C3432 Malignant neoplasm of lower lobe, left bronchus or lung: Secondary | ICD-10-CM | POA: Diagnosis not present

## 2021-07-31 DIAGNOSIS — Z5112 Encounter for antineoplastic immunotherapy: Secondary | ICD-10-CM | POA: Diagnosis not present

## 2021-07-31 DIAGNOSIS — F17210 Nicotine dependence, cigarettes, uncomplicated: Secondary | ICD-10-CM | POA: Diagnosis not present

## 2021-07-31 DIAGNOSIS — C7989 Secondary malignant neoplasm of other specified sites: Secondary | ICD-10-CM | POA: Diagnosis not present

## 2021-07-31 DIAGNOSIS — Z510 Encounter for antineoplastic radiation therapy: Secondary | ICD-10-CM | POA: Diagnosis not present

## 2021-07-31 DIAGNOSIS — Z5111 Encounter for antineoplastic chemotherapy: Secondary | ICD-10-CM | POA: Diagnosis not present

## 2021-07-31 LAB — RAD ONC ARIA SESSION SUMMARY
Course Elapsed Days: 9
Plan Fractions Treated to Date: 8
Plan Prescribed Dose Per Fraction: 3 Gy
Plan Total Fractions Prescribed: 10
Plan Total Prescribed Dose: 30 Gy
Reference Point Dosage Given to Date: 24 Gy
Reference Point Session Dosage Given: 3 Gy
Session Number: 8

## 2021-08-01 ENCOUNTER — Other Ambulatory Visit: Payer: Medicare Other

## 2021-08-01 ENCOUNTER — Inpatient Hospital Stay: Payer: Medicare Other

## 2021-08-01 ENCOUNTER — Ambulatory Visit
Admission: RE | Admit: 2021-08-01 | Discharge: 2021-08-01 | Disposition: A | Discharge status: Home / Self Care | Payer: Medicare Other | Source: Ambulatory Visit | Attending: Radiation Oncology | Admitting: Radiation Oncology

## 2021-08-01 ENCOUNTER — Other Ambulatory Visit: Payer: Self-pay

## 2021-08-01 DIAGNOSIS — C7951 Secondary malignant neoplasm of bone: Secondary | ICD-10-CM | POA: Diagnosis not present

## 2021-08-01 DIAGNOSIS — C7989 Secondary malignant neoplasm of other specified sites: Secondary | ICD-10-CM | POA: Diagnosis not present

## 2021-08-01 DIAGNOSIS — Z95828 Presence of other vascular implants and grafts: Secondary | ICD-10-CM

## 2021-08-01 DIAGNOSIS — C3432 Malignant neoplasm of lower lobe, left bronchus or lung: Secondary | ICD-10-CM | POA: Diagnosis not present

## 2021-08-01 DIAGNOSIS — Z510 Encounter for antineoplastic radiation therapy: Secondary | ICD-10-CM | POA: Diagnosis not present

## 2021-08-01 DIAGNOSIS — Z5111 Encounter for antineoplastic chemotherapy: Secondary | ICD-10-CM | POA: Diagnosis not present

## 2021-08-01 DIAGNOSIS — F17210 Nicotine dependence, cigarettes, uncomplicated: Secondary | ICD-10-CM | POA: Diagnosis not present

## 2021-08-01 DIAGNOSIS — Z5112 Encounter for antineoplastic immunotherapy: Secondary | ICD-10-CM | POA: Diagnosis not present

## 2021-08-01 LAB — RAD ONC ARIA SESSION SUMMARY
Course Elapsed Days: 10
Plan Fractions Treated to Date: 9
Plan Prescribed Dose Per Fraction: 3 Gy
Plan Total Fractions Prescribed: 10
Plan Total Prescribed Dose: 30 Gy
Reference Point Dosage Given to Date: 27 Gy
Reference Point Session Dosage Given: 3 Gy
Session Number: 9

## 2021-08-01 LAB — CBC WITH DIFFERENTIAL (CANCER CENTER ONLY)
Abs Immature Granulocytes: 0.91 10*3/uL — ABNORMAL HIGH (ref 0.00–0.07)
Basophils Absolute: 0.1 10*3/uL (ref 0.0–0.1)
Basophils Relative: 0 %
Eosinophils Absolute: 0.1 10*3/uL (ref 0.0–0.5)
Eosinophils Relative: 1 %
HCT: 39 % (ref 39.0–52.0)
Hemoglobin: 13 g/dL (ref 13.0–17.0)
Immature Granulocytes: 6 %
Lymphocytes Relative: 5 %
Lymphs Abs: 0.8 10*3/uL (ref 0.7–4.0)
MCH: 29.4 pg (ref 26.0–34.0)
MCHC: 33.3 g/dL (ref 30.0–36.0)
MCV: 88.2 fL (ref 80.0–100.0)
Monocytes Absolute: 0.7 10*3/uL (ref 0.1–1.0)
Monocytes Relative: 5 %
Neutro Abs: 13.7 10*3/uL — ABNORMAL HIGH (ref 1.7–7.7)
Neutrophils Relative %: 83 %
Platelet Count: 159 10*3/uL (ref 150–400)
RBC: 4.42 MIL/uL (ref 4.22–5.81)
RDW: 13.7 % (ref 11.5–15.5)
Smear Review: NORMAL
WBC Count: 16.4 10*3/uL — ABNORMAL HIGH (ref 4.0–10.5)
nRBC: 0 % (ref 0.0–0.2)

## 2021-08-01 LAB — CMP (CANCER CENTER ONLY)
ALT: 9 U/L (ref 0–44)
AST: 13 U/L — ABNORMAL LOW (ref 15–41)
Albumin: 3.7 g/dL (ref 3.5–5.0)
Alkaline Phosphatase: 106 U/L (ref 38–126)
Anion gap: 5 (ref 5–15)
BUN: 14 mg/dL (ref 8–23)
CO2: 29 mmol/L (ref 22–32)
Calcium: 9.3 mg/dL (ref 8.9–10.3)
Chloride: 103 mmol/L (ref 98–111)
Creatinine: 0.83 mg/dL (ref 0.61–1.24)
GFR, Estimated: >60 mL/min (ref >60)
Glucose, Bld: 97 mg/dL (ref 70–99)
Potassium: 4 mmol/L (ref 3.5–5.1)
Sodium: 137 mmol/L (ref 135–145)
Total Bilirubin: 0.4 mg/dL (ref 0.3–1.2)
Total Protein: 6.8 g/dL (ref 6.5–8.1)

## 2021-08-01 MED ORDER — SODIUM CHLORIDE 0.9% FLUSH
10.0000 mL | INTRAVENOUS | Status: AC | PRN
  Administered 2021-08-01: 10 mL

## 2021-08-02 ENCOUNTER — Encounter: Payer: Self-pay | Admitting: Radiation Oncology

## 2021-08-02 ENCOUNTER — Other Ambulatory Visit: Payer: Self-pay

## 2021-08-02 ENCOUNTER — Ambulatory Visit
Admission: RE | Admit: 2021-08-02 | Discharge: 2021-08-02 | Disposition: A | Discharge status: Home / Self Care | Payer: Medicare Other | Source: Ambulatory Visit | Attending: Radiation Oncology | Admitting: Radiation Oncology

## 2021-08-02 DIAGNOSIS — Z510 Encounter for antineoplastic radiation therapy: Secondary | ICD-10-CM | POA: Diagnosis not present

## 2021-08-02 DIAGNOSIS — F17210 Nicotine dependence, cigarettes, uncomplicated: Secondary | ICD-10-CM | POA: Diagnosis not present

## 2021-08-02 DIAGNOSIS — C3432 Malignant neoplasm of lower lobe, left bronchus or lung: Secondary | ICD-10-CM | POA: Diagnosis not present

## 2021-08-02 DIAGNOSIS — C7951 Secondary malignant neoplasm of bone: Secondary | ICD-10-CM | POA: Diagnosis not present

## 2021-08-02 DIAGNOSIS — Z5112 Encounter for antineoplastic immunotherapy: Secondary | ICD-10-CM | POA: Diagnosis not present

## 2021-08-02 DIAGNOSIS — Z5111 Encounter for antineoplastic chemotherapy: Secondary | ICD-10-CM | POA: Diagnosis not present

## 2021-08-02 DIAGNOSIS — C7989 Secondary malignant neoplasm of other specified sites: Secondary | ICD-10-CM | POA: Diagnosis not present

## 2021-08-02 LAB — RAD ONC ARIA SESSION SUMMARY
Course Elapsed Days: 11
Plan Fractions Treated to Date: 10
Plan Prescribed Dose Per Fraction: 3 Gy
Plan Total Fractions Prescribed: 10
Plan Total Prescribed Dose: 30 Gy
Reference Point Dosage Given to Date: 30 Gy
Reference Point Session Dosage Given: 3 Gy
Session Number: 10

## 2021-08-06 ENCOUNTER — Telehealth: Payer: Self-pay | Admitting: Physician Assistant

## 2021-08-06 NOTE — Telephone Encounter (Signed)
Called patient regarding upcoming rescheduled appointments, patient is notified.  ?

## 2021-08-07 ENCOUNTER — Other Ambulatory Visit: Payer: Self-pay | Admitting: Physician Assistant

## 2021-08-07 DIAGNOSIS — Z95828 Presence of other vascular implants and grafts: Secondary | ICD-10-CM | POA: Insufficient documentation

## 2021-08-07 DIAGNOSIS — C3492 Malignant neoplasm of unspecified part of left bronchus or lung: Secondary | ICD-10-CM

## 2021-08-08 ENCOUNTER — Other Ambulatory Visit: Payer: Medicare Other

## 2021-08-08 ENCOUNTER — Inpatient Hospital Stay: Payer: Medicare Other | Attending: Internal Medicine

## 2021-08-08 ENCOUNTER — Ambulatory Visit: Payer: Medicare Other | Admitting: Physician Assistant

## 2021-08-08 ENCOUNTER — Inpatient Hospital Stay (HOSPITAL_BASED_OUTPATIENT_CLINIC_OR_DEPARTMENT_OTHER): Payer: Medicare Other | Admitting: Physician Assistant

## 2021-08-08 ENCOUNTER — Ambulatory Visit: Payer: Medicare Other | Admitting: Adult Health

## 2021-08-08 ENCOUNTER — Other Ambulatory Visit: Payer: Self-pay

## 2021-08-08 ENCOUNTER — Inpatient Hospital Stay: Payer: Medicare Other

## 2021-08-08 VITALS — BP 113/67 | HR 97 | Temp 97.6°F | Resp 17 | Ht 74.0 in | Wt 161.3 lb

## 2021-08-08 DIAGNOSIS — Z5111 Encounter for antineoplastic chemotherapy: Secondary | ICD-10-CM | POA: Diagnosis not present

## 2021-08-08 DIAGNOSIS — Z79899 Other long term (current) drug therapy: Secondary | ICD-10-CM | POA: Insufficient documentation

## 2021-08-08 DIAGNOSIS — Z5189 Encounter for other specified aftercare: Secondary | ICD-10-CM | POA: Diagnosis not present

## 2021-08-08 DIAGNOSIS — C3492 Malignant neoplasm of unspecified part of left bronchus or lung: Secondary | ICD-10-CM

## 2021-08-08 DIAGNOSIS — C7989 Secondary malignant neoplasm of other specified sites: Secondary | ICD-10-CM | POA: Insufficient documentation

## 2021-08-08 DIAGNOSIS — Z5112 Encounter for antineoplastic immunotherapy: Secondary | ICD-10-CM | POA: Insufficient documentation

## 2021-08-08 DIAGNOSIS — R5383 Other fatigue: Secondary | ICD-10-CM

## 2021-08-08 DIAGNOSIS — C3432 Malignant neoplasm of lower lobe, left bronchus or lung: Secondary | ICD-10-CM | POA: Insufficient documentation

## 2021-08-08 DIAGNOSIS — C782 Secondary malignant neoplasm of pleura: Secondary | ICD-10-CM | POA: Insufficient documentation

## 2021-08-08 DIAGNOSIS — Z95828 Presence of other vascular implants and grafts: Secondary | ICD-10-CM

## 2021-08-08 LAB — CMP (CANCER CENTER ONLY)
ALT: 9 U/L (ref 0–44)
AST: 14 U/L — ABNORMAL LOW (ref 15–41)
Albumin: 3.9 g/dL (ref 3.5–5.0)
Alkaline Phosphatase: 93 U/L (ref 38–126)
Anion gap: 6 (ref 5–15)
BUN: 21 mg/dL (ref 8–23)
CO2: 26 mmol/L (ref 22–32)
Calcium: 9.5 mg/dL (ref 8.9–10.3)
Chloride: 105 mmol/L (ref 98–111)
Creatinine: 1.01 mg/dL (ref 0.61–1.24)
GFR, Estimated: >60 mL/min (ref >60)
Glucose, Bld: 105 mg/dL — ABNORMAL HIGH (ref 70–99)
Potassium: 4 mmol/L (ref 3.5–5.1)
Sodium: 137 mmol/L (ref 135–145)
Total Bilirubin: 0.7 mg/dL (ref 0.3–1.2)
Total Protein: 7.2 g/dL (ref 6.5–8.1)

## 2021-08-08 LAB — CBC WITH DIFFERENTIAL (CANCER CENTER ONLY)
Abs Immature Granulocytes: 0.16 10*3/uL — ABNORMAL HIGH (ref 0.00–0.07)
Basophils Absolute: 0.1 10*3/uL (ref 0.0–0.1)
Basophils Relative: 1 %
Eosinophils Absolute: 0 10*3/uL (ref 0.0–0.5)
Eosinophils Relative: 1 %
HCT: 41.2 % (ref 39.0–52.0)
Hemoglobin: 13.9 g/dL (ref 13.0–17.0)
Immature Granulocytes: 2 %
Lymphocytes Relative: 5 %
Lymphs Abs: 0.4 10*3/uL — ABNORMAL LOW (ref 0.7–4.0)
MCH: 30.1 pg (ref 26.0–34.0)
MCHC: 33.7 g/dL (ref 30.0–36.0)
MCV: 89.2 fL (ref 80.0–100.0)
Monocytes Absolute: 0.4 10*3/uL (ref 0.1–1.0)
Monocytes Relative: 5 %
Neutro Abs: 7.2 10*3/uL (ref 1.7–7.7)
Neutrophils Relative %: 86 %
Platelet Count: 136 10*3/uL — ABNORMAL LOW (ref 150–400)
RBC: 4.62 MIL/uL (ref 4.22–5.81)
RDW: 14.6 % (ref 11.5–15.5)
WBC Count: 8.3 10*3/uL (ref 4.0–10.5)
nRBC: 0 % (ref 0.0–0.2)

## 2021-08-08 LAB — TSH: TSH: 0.615 u[IU]/mL (ref 0.350–4.500)

## 2021-08-08 MED ORDER — SODIUM CHLORIDE 0.9% FLUSH
10.0000 mL | INTRAVENOUS | Status: DC | PRN
  Administered 2021-08-08: 10 mL

## 2021-08-08 MED ORDER — SODIUM CHLORIDE 0.9% FLUSH
10.0000 mL | Freq: Once | INTRAVENOUS | Status: AC
  Administered 2021-08-08: 10 mL

## 2021-08-08 MED ORDER — HEPARIN SOD (PORK) LOCK FLUSH 100 UNIT/ML IV SOLN
500.0000 [IU] | Freq: Once | INTRAVENOUS | Status: AC | PRN
  Administered 2021-08-08: 500 [IU]

## 2021-08-08 MED ORDER — DIPHENHYDRAMINE HCL 50 MG/ML IJ SOLN
50.0000 mg | Freq: Once | INTRAMUSCULAR | Status: AC
  Administered 2021-08-08: 50 mg via INTRAVENOUS
  Filled 2021-08-08: qty 1

## 2021-08-08 MED ORDER — SODIUM CHLORIDE 0.9 % IV SOLN
440.5000 mg | Freq: Once | INTRAVENOUS | Status: AC
  Administered 2021-08-08: 440 mg via INTRAVENOUS
  Filled 2021-08-08: qty 44

## 2021-08-08 MED ORDER — SODIUM CHLORIDE 0.9 % IV SOLN
10.0000 mg | Freq: Once | INTRAVENOUS | Status: AC
  Administered 2021-08-08: 10 mg via INTRAVENOUS
  Filled 2021-08-08: qty 10

## 2021-08-08 MED ORDER — SODIUM CHLORIDE 0.9 % IV SOLN
150.0000 mg | Freq: Once | INTRAVENOUS | Status: AC
  Administered 2021-08-08: 150 mg via INTRAVENOUS
  Filled 2021-08-08: qty 150

## 2021-08-08 MED ORDER — SODIUM CHLORIDE 0.9 % IV SOLN: Freq: Once | INTRAVENOUS | Status: AC

## 2021-08-08 MED ORDER — SODIUM CHLORIDE 0.9 % IV SOLN
350.0000 mg | Freq: Once | INTRAVENOUS | Status: AC
  Administered 2021-08-08: 350 mg via INTRAVENOUS
  Filled 2021-08-08: qty 7

## 2021-08-08 MED ORDER — PALONOSETRON HCL INJECTION 0.25 MG/5ML
0.2500 mg | Freq: Once | INTRAVENOUS | Status: AC
  Administered 2021-08-08: 0.25 mg via INTRAVENOUS
  Filled 2021-08-08: qty 5

## 2021-08-08 MED ORDER — FAMOTIDINE IN NACL 20-0.9 MG/50ML-% IV SOLN
20.0000 mg | Freq: Once | INTRAVENOUS | Status: AC
  Administered 2021-08-08: 20 mg via INTRAVENOUS
  Filled 2021-08-08: qty 50

## 2021-08-08 MED ORDER — SODIUM CHLORIDE 0.9 % IV SOLN
175.0000 mg/m2 | Freq: Once | INTRAVENOUS | Status: AC
  Administered 2021-08-08: 354 mg via INTRAVENOUS
  Filled 2021-08-08: qty 59

## 2021-08-08 NOTE — Progress Notes (Signed)
?    [facility] ?[phone]   [phone] ? ?OFFICE PROGRESS NOTE ? ?[name], PA-C ?[address] ?[place] [address] ? ?DIAGNOSIS: Stage IV (T3, N1, M1b) non-small cell lung cancer favoring squamous cell carcinoma presented with superior segment left lower lobe lung mass with invasion of the chest wall and destruction of the posterior left sixth and seventh ribs as well as left hilar adenopathy and suspicious pleural metastasis diagnosed in March 2023 ? ?PRIOR THERAPY: Palliative radiotherapy to the left lower lobe lung mass with chest wall invasion under the care of Dr. Sondra Come ? ?CURRENT THERAPY: Palliative systemic chemotherapy with carboplatin for AUC of 5, paclitaxel 175 Mg/M2 and Libtayo (Cempilimab) 350 Mg IV every 3 weeks with Neulasta support.  Status post 1 cycle.  First dose was given on July 18, 2021 ? ?INTERVAL HISTORY: ?Edward Beck 76 y.o. male returns to the clinic today for follow-up visit accompanied by his wife.  He reports feeling well after completing palliative radiation.  He does experience some fatigue but continues to complete his daily activities on his own.  Patient reports having 1 episode of vomiting and several episodes of diarrhea yesterday after eating soup.  Today he is feeling much better and denies any subsequent episodes of nausea, vomiting or diarrhea.  He reports fair appetite and has lost 5 pounds since 07/24/2021. He reports mild shortness of breath mainly with exertion.  He has intermittent episodes of left-sided chest pain that is stable to improved.  He denies fevers, chills, night sweats, cough, hemoptysis.  He has no other complaints.  Rest of the review of systems as below. ? ? ?MEDICAL HISTORY: ?Past Medical History:  ?Diagnosis Date  ? Condyloma acuminatum of scrotum   ? COVID 2021  ? December 2022 - mild  ? Lung cancer ([facility])   ? Pneumonia   ? Scrotal lesion   ? ? ?ALLERGIES:  is allergic to tamiflu [oseltamivir  phosphate]. ? ?MEDICATIONS:  ?Current Outpatient Medications  ?Medication Sig Dispense Refill  ? finasteride (PROSCAR) 5 MG tablet Take 1 tablet (5 mg total) by mouth daily. 90 tablet 3  ? HYDROcodone-acetaminophen (NORCO) 10-325 MG tablet Take 1 tablet by mouth every 6 (six) hours as needed for moderate pain. 45 tablet 0  ? lidocaine-prilocaine (EMLA) cream Apply to the Port-A-Cath site 30 minutes before chemotherapy. (Patient not taking: Reported on 07/15/2021) 30 g 0  ? naproxen sodium (ALEVE) 220 MG tablet Take 220 mg by mouth 2 (two) times daily as needed (pain).    ? prochlorperazine (COMPAZINE) 10 MG tablet Take 1 tablet (10 mg total) by mouth every 6 (six) hours as needed for nausea or vomiting. (Patient not taking: Reported on 07/15/2021) 30 tablet 0  ? triamcinolone cream (KENALOG) 0.1 % Apply 1 application. topically 2 (two) times daily as needed (dry skin).    ? umeclidinium-vilanterol (ANORO ELLIPTA) 62.5-25 MCG/ACT AEPB Inhale 1 puff into the lungs daily. (Patient not taking: Reported on 07/15/2021) 7 each 0  ? ?No current facility-administered medications for this visit.  ? ? ?SURGICAL HISTORY:  ?Past Surgical History:  ?Procedure Laterality Date  ? BRONCHIAL BIOPSY  07/02/2021  ? Procedure: BRONCHIAL BIOPSIES;  Surgeon: Garner Nash, DO;  Location: [facility];  Service: Pulmonary;;  ? BRONCHIAL BRUSHINGS  07/02/2021  ? Procedure: BRONCHIAL BRUSHINGS;  Surgeon: Garner Nash, DO;  Location: [facility];  Service: Pulmonary;;  ? BRONCHIAL NEEDLE ASPIRATION BIOPSY  07/02/2021  ? Procedure: BRONCHIAL  NEEDLE ASPIRATION BIOPSIES;  Surgeon: Garner Nash, DO;  Location: [facility];  Service: Pulmonary;;  ? HERNIA REPAIR  2009  ? left inguinal  ? IR IMAGING GUIDED PORT INSERTION  07/16/2021  ? SCROTAL EXPLORATION  11/21/2011  ? Procedure: SCROTUM EXPLORATION;  Surgeon: Hanley Ben, MD;  Location: [facility];  Service: Urology;  Laterality: N/A;  EXCISION, BIOPSY SCROTAL CONDYLOMA ?  ? VIDEO  BRONCHOSCOPY WITH ENDOBRONCHIAL ULTRASOUND Bilateral 07/02/2021  ? Procedure: VIDEO BRONCHOSCOPY WITH ENDOBRONCHIAL ULTRASOUND;  Surgeon: Garner Nash, DO;  Location: [facility];  Service: Pulmonary;  Laterality: Bilateral;  ? ? ? ?REVIEW OF SYSTEMS:   ?Constitutional: Negative for chills, fever. +Fatigue, appetite loss, weight loss ?HENT: Negative for mouth sores, nosebleeds, sore throat and trouble swallowing.   ?Eyes: Negative for eye problems and icterus.  ?Respiratory: Negative for cough, hemoptysis, shortness of breath and wheezing.   ?Cardiovascular: Negative for leg swelling. +Pleurisy/chest pain ?Gastrointestinal: Negative for abdominal pain, constipation, nausea. +Vomiting, diarrhea.  ?Genitourinary: Negative for bladder incontinence, difficulty urinating, dysuria, frequency and hematuria.   ?Musculoskeletal: Negative for back pain, gait problem, neck pain and neck stiffness.  ?Skin:Negative for rash and ulcers ?Neurological: Negative for dizziness, extremity weakness, gait problem, headaches, light-headedness and seizures.  ?Hematological: Negative for adenopathy. Does not bruise/bleed easily.  ?Psychiatric/Behavioral: Negative for confusion, depression and sleep disturbance. The patient is not nervous/anxious.   ? ?PHYSICAL EXAM:  ?Constitutional: Oriented to person, place, and time and well-developed, well-nourished, and in no distress.  ?HENT:  ?Head: Normocephalic and atraumatic.  ?Eyes: Conjunctivae are normal. Right eye exhibits no discharge. Left eye exhibits no discharge. No scleral icterus.  ?Neck: Normal range of motion. Neck supple.   ?Cardiovascular: Normal rate, regular rhythm, normal heart sounds and intact distal pulses.   ?Pulmonary/Chest: Effort normal and breath sounds normal. No respiratory distress. No wheezes. No rales.  ?Abdominal: Soft. Bowel sounds are normal. Exhibits no distension and no mass. There is no tenderness.  ?Musculoskeletal: Normal range of motion. Exhibits no  edema.  ?Lymphadenopathy: No cervical adenopathy.  ?Neurological: Alert and oriented to person, place, and time. Gait normal. Coordination normal.  ?Skin: Skin is warm and dry. Not diaphoretic. No erythema. No pallor. +improving rash on back from radiation.  ?Psychiatric: Mood, memory and judgment normal.  ? ?ECOG PERFORMANCE STATUS: 1 - Symptomatic but completely ambulatory ? ?There were no vitals taken for this visit. ? ?LABORATORY DATA: ?Lab Results  ?Component Value Date  ? WBC 8.3 08/08/2021  ? HGB 13.9 08/08/2021  ? HCT 41.2 08/08/2021  ? MCV 89.2 08/08/2021  ? PLT 136 (L) 08/08/2021  ? ? ?  Chemistry   ?   ?Component Value Date/Time  ? NA 137 08/01/2021 1053  ? K 4.0 08/01/2021 1053  ? CL 103 08/01/2021 1053  ? CO2 29 08/01/2021 1053  ? BUN 14 08/01/2021 1053  ? CREATININE 0.83 08/01/2021 1053  ?    ?Component Value Date/Time  ? CALCIUM 9.3 08/01/2021 1053  ? ALKPHOS 106 08/01/2021 1053  ? AST 13 (L) 08/01/2021 1053  ? ALT 9 08/01/2021 1053  ? BILITOT 0.4 08/01/2021 1053  ?  ? ? ? ?RADIOGRAPHIC STUDIES: ?MR BRAIN W WO CONTRAST ? ?Result Date: 07/11/2021 ?CLINICAL DATA:  Provided history: Lung mass. Lung nodule. EXAM: MRI HEAD WITHOUT AND WITH CONTRAST TECHNIQUE: Multiplanar, multiecho pulse sequences of the brain and surrounding structures were obtained without and with intravenous contrast. CONTRAST:  7.26mL GADAVIST GADOBUTROL 1 MMOL/ML IV SOLN COMPARISON:  No pertinent prior  exams available for comparison. FINDINGS: Brain: Mild generalized parenchymal atrophy. Mild multifocal T2 FLAIR hyperintense signal abnormality within the cerebral white matter and pons, nonspecific but compatible with chronic small vessel ischemic disease. There is no acute infarct. No evidence of an intracranial mass. No chronic intracranial blood products. No extra-axial fluid collection. No midline shift. No pathologic intracranial enhancement identified. Vascular: Maintained flow voids within the proximal large arterial vessels. Left  frontal lobe developmental venous anomaly (anatomic variant). Skull and upper cervical spine: Nonspecific 8 mm T2 FLAIR hyperintense and enhancing lesion within the left temporoparietal calvarium (series 11, image

## 2021-08-08 NOTE — Patient Instructions (Signed)
[facility]  Discharge Instructions: ?Thank you for choosing [facility] to provide your oncology and hematology care.  ? ?If you have a lab appointment with the [facility], please go directly to the [facility] and check in at the registration area. ?  ?Wear comfortable clothing and clothing appropriate for easy access to any Portacath or PICC line.  ? ?We strive to give you quality time with your provider. You may need to reschedule your appointment if you arrive late (15 or more minutes).  Arriving late affects you and other patients whose appointments are after yours.  Also, if you miss three or more appointments without notifying the office, you may be dismissed from the clinic at the provider?s discretion.    ?  ?For prescription refill requests, have your pharmacy contact our office and allow 72 hours for refills to be completed.   ? ?Today you received the following chemotherapy and/or immunotherapy agents: Libtayo, Paclitaxel, Carboplatin.     ?  ?To help prevent nausea and vomiting after your treatment, we encourage you to take your nausea medication as directed. ? ?BELOW ARE SYMPTOMS THAT SHOULD BE REPORTED IMMEDIATELY: ?*FEVER GREATER THAN 100.4 F (38 ?C) OR HIGHER ?*CHILLS OR SWEATING ?*NAUSEA AND VOMITING THAT IS NOT CONTROLLED WITH YOUR NAUSEA MEDICATION ?*UNUSUAL SHORTNESS OF BREATH ?*UNUSUAL BRUISING OR BLEEDING ?*URINARY PROBLEMS (pain or burning when urinating, or frequent urination) ?*BOWEL PROBLEMS (unusual diarrhea, constipation, pain near the anus) ?TENDERNESS IN MOUTH AND THROAT WITH OR WITHOUT PRESENCE OF ULCERS (sore throat, sores in mouth, or a toothache) ?UNUSUAL RASH, SWELLING OR PAIN  ?UNUSUAL VAGINAL DISCHARGE OR ITCHING  ? ?Items with * indicate a potential emergency and should be followed up as soon as possible or go to the Emergency Department if any problems should occur. ? ?Please show the CHEMOTHERAPY ALERT CARD or IMMUNOTHERAPY  ALERT CARD at check-in to the Emergency Department and triage nurse. ? ?Should you have questions after your visit or need to cancel or reschedule your appointment, please contact [facility]  Dept: [phone]  and follow the prompts.  Office hours are 8:00 a.m. to 4:30 p.m. Monday - Friday. Please note that voicemails left after 4:00 p.m. may not be returned until the following business day.  We are closed weekends and major holidays. You have access to a nurse at all times for urgent questions. Please call the main number to the clinic Dept: [phone] and follow the prompts. ? ? ?For any non-urgent questions, you may also contact your provider using MyChart. We now offer e-Visits for anyone 39 and older to request care online for non-urgent symptoms. For details visit [email or web address]. ?  ?Also download the MyChart app! Go to the app store, search "MyChart", open the app, select [facility], and log in with your MyChart username and password. ? ?Due to Covid, a mask is required upon entering the hospital/clinic. If you do not have a mask, one will be given to you upon arrival. For doctor visits, patients may have 1 support person aged 38 or older with them. For treatment visits, patients cannot have anyone with them due to current Covid guidelines and our immunocompromised population.  ? ?Cemiplimab injection ?What is this medication? ?CEMIPLIMAB (se mip li mab) is a monoclonal antibody. It treats certain types of cancer. Some of the cancers treated are cutaneous squamous cell carcinoma and basal cell carcinoma. ?This medicine may be used for other purposes; ask your health  care provider or pharmacist if you have questions. ?COMMON BRAND NAME(S): LIBTAYO ?What should I tell my care team before I take this medication? ?They need to know if you have any of these conditions: ?autoimmune diseases like Crohn's disease, ulcerative colitis, or lupus ?have had or planning to have  an allogeneic stem cell transplant (uses someone else's stem cells) ?history of organ transplant ?nervous system problems like myasthenia gravis or Guillain-Barre syndrome ?an unusual or allergic reaction to cemiplimab, other drugs, foods, dyes, or preservatives ?pregnant or trying to get pregnant ?breast-feeding ?How should I use this medication? ?This medicine is for infusion into a vein. It is given by a health care professional in a hospital or clinic setting. ?A special MedGuide will be given to you before each treatment. Be sure to read this information carefully each time. ?Talk to your pediatrician regarding the use of this medicine in children. Special care may be needed. ?Overdosage: If you think you have taken too much of this medicine contact a poison control center or emergency room at once. ?NOTE: This medicine is only for you. Do not share this medicine with others. ?What if I miss a dose? ?It is important not to miss your dose. Call your doctor or health care professional if you are unable to keep an appointment. ?What may interact with this medication? ?Interactions have not been studied. ?This list may not describe all possible interactions. Give your health care provider a list of all the medicines, herbs, non-prescription drugs, or dietary supplements you use. Also tell them if you smoke, drink alcohol, or use illegal drugs. Some items may interact with your medicine. ?What should I watch for while using this medication? ?Your condition will be monitored carefully while you are receiving this medicine. ?You may need blood work done while you are taking this medicine. ?Do not become pregnant while taking this medicine or for at least 4 months after stopping it. Women should inform their doctor if they wish to become pregnant or think they might be pregnant. There is a potential for serious side effects to an unborn child. Talk to your health care professional or pharmacist for more information. Do  not breast-feed an infant while taking this medicine or for at least 4 months after the last dose. ?What side effects may I notice from receiving this medication? ?Side effects that you should report to your doctor or health care professional as soon as possible: ?allergic reactions like skin rash, itching or hives; swelling of the face, lips, or tongue ?black, tarry stools ?bloody or watery diarrhea ?breathing problems ?changes in vision ?changes in voice ?chest pain or chest tightness ?chills ?cough ?dizziness ?fast or irregular heart beat ?feeling faint or lightheaded ?hair loss ?increased hunger or thirst ?muscle weakness ?persistent headache ?redness, blistering, peeling or loosening of the skin, including inside the mouth ?signs and symptoms of kidney injury like trouble passing urine or change in the amount of urine ?signs and symptoms of liver injury like dark yellow or brown urine; general ill feeling or flu-like symptoms; light-colored stools; loss of appetite; nausea; right upper belly pain; unusually weak or tired; yellowing of the eyes or skin ?stomach pain ?unusual bleeding or bruising ?weight gain or weight loss ?unusual sweating ?Side effects that usually do not require medical attention (report these to your doctor or health care professional if they continue or are bothersome): ?bone pain ?constipation ?muscle pain ?tiredness ?This list may not describe all possible side effects. Call your doctor for medical advice  about side effects. You may report side effects to FDA at 1-800-FDA-1088. ?Where should I keep my medication? ?This drug is given in a hospital or clinic and will not be stored at home. ?NOTE: This sheet is a summary. It may not cover all possible information. If you have questions about this medicine, talk to your doctor, pharmacist, or health care provider. ?? 2022 Elsevier/Gold Standard (2020-12-11 00:00:00) ? ?Paclitaxel injection ?What is this medication? ?PACLITAXEL (PAK li TAX el)  is a chemotherapy drug. It targets fast dividing cells, like cancer cells, and causes these cells to die. This medicine is used to treat ovarian cancer, breast cancer, lung cancer, Kaposi's sarcoma,

## 2021-08-10 ENCOUNTER — Inpatient Hospital Stay: Payer: Medicare Other

## 2021-08-10 ENCOUNTER — Other Ambulatory Visit: Payer: Self-pay

## 2021-08-10 VITALS — BP 107/67 | HR 102 | Temp 98.4°F | Resp 17

## 2021-08-10 DIAGNOSIS — C782 Secondary malignant neoplasm of pleura: Secondary | ICD-10-CM | POA: Diagnosis not present

## 2021-08-10 DIAGNOSIS — Z5111 Encounter for antineoplastic chemotherapy: Secondary | ICD-10-CM | POA: Diagnosis not present

## 2021-08-10 DIAGNOSIS — Z5112 Encounter for antineoplastic immunotherapy: Secondary | ICD-10-CM | POA: Diagnosis not present

## 2021-08-10 DIAGNOSIS — C3492 Malignant neoplasm of unspecified part of left bronchus or lung: Secondary | ICD-10-CM

## 2021-08-10 DIAGNOSIS — Z5189 Encounter for other specified aftercare: Secondary | ICD-10-CM | POA: Diagnosis not present

## 2021-08-10 DIAGNOSIS — C3432 Malignant neoplasm of lower lobe, left bronchus or lung: Secondary | ICD-10-CM | POA: Diagnosis not present

## 2021-08-10 DIAGNOSIS — C7989 Secondary malignant neoplasm of other specified sites: Secondary | ICD-10-CM | POA: Diagnosis not present

## 2021-08-10 MED ORDER — PEGFILGRASTIM-CBQV 6 MG/0.6ML ~~LOC~~ SOSY
6.0000 mg | PREFILLED_SYRINGE | Freq: Once | SUBCUTANEOUS | Status: AC
  Administered 2021-08-10: 6 mg via SUBCUTANEOUS
  Filled 2021-08-10: qty 0.6

## 2021-08-14 ENCOUNTER — Telehealth: Payer: Self-pay | Admitting: *Deleted

## 2021-08-14 NOTE — Telephone Encounter (Signed)
CALLED PATIENT TO [facility] FU ON 09-05-21 DUE TO DR. KINARD BEING ON VACATION, SPOKE WITH PATIENT AND HE AGREED TO COME ON 09-12-21 @ 9 AM ?

## 2021-08-15 ENCOUNTER — Encounter (HOSPITAL_COMMUNITY): Payer: Self-pay

## 2021-08-15 ENCOUNTER — Other Ambulatory Visit: Payer: Self-pay

## 2021-08-15 ENCOUNTER — Inpatient Hospital Stay: Payer: Medicare Other

## 2021-08-15 ENCOUNTER — Other Ambulatory Visit: Payer: Medicare Other

## 2021-08-15 DIAGNOSIS — C3492 Malignant neoplasm of unspecified part of left bronchus or lung: Secondary | ICD-10-CM

## 2021-08-15 DIAGNOSIS — Z5112 Encounter for antineoplastic immunotherapy: Secondary | ICD-10-CM | POA: Diagnosis not present

## 2021-08-15 DIAGNOSIS — C7989 Secondary malignant neoplasm of other specified sites: Secondary | ICD-10-CM | POA: Diagnosis not present

## 2021-08-15 DIAGNOSIS — Z95828 Presence of other vascular implants and grafts: Secondary | ICD-10-CM

## 2021-08-15 DIAGNOSIS — Z5189 Encounter for other specified aftercare: Secondary | ICD-10-CM | POA: Diagnosis not present

## 2021-08-15 DIAGNOSIS — Z5111 Encounter for antineoplastic chemotherapy: Secondary | ICD-10-CM

## 2021-08-15 DIAGNOSIS — C3432 Malignant neoplasm of lower lobe, left bronchus or lung: Secondary | ICD-10-CM | POA: Diagnosis not present

## 2021-08-15 DIAGNOSIS — C782 Secondary malignant neoplasm of pleura: Secondary | ICD-10-CM | POA: Diagnosis not present

## 2021-08-15 DIAGNOSIS — R5383 Other fatigue: Secondary | ICD-10-CM

## 2021-08-15 LAB — CBC WITH DIFFERENTIAL (CANCER CENTER ONLY)
Abs Immature Granulocytes: 0 10*3/uL (ref 0.00–0.07)
Band Neutrophils: 2 %
Basophils Absolute: 0 10*3/uL (ref 0.0–0.1)
Basophils Relative: 0 %
Eosinophils Absolute: 0.1 10*3/uL (ref 0.0–0.5)
Eosinophils Relative: 2 %
HCT: 37.4 % — ABNORMAL LOW (ref 39.0–52.0)
Hemoglobin: 12.8 g/dL — ABNORMAL LOW (ref 13.0–17.0)
Lymphocytes Relative: 27 %
Lymphs Abs: 1.1 10*3/uL (ref 0.7–4.0)
MCH: 29.6 pg (ref 26.0–34.0)
MCHC: 34.2 g/dL (ref 30.0–36.0)
MCV: 86.6 fL (ref 80.0–100.0)
Monocytes Absolute: 0.3 10*3/uL (ref 0.1–1.0)
Monocytes Relative: 8 %
Myelocytes: 1 %
Neutro Abs: 2.6 10*3/uL (ref 1.7–7.7)
Neutrophils Relative %: 60 %
Platelet Count: 28 10*3/uL — ABNORMAL LOW (ref 150–400)
RBC: 4.32 MIL/uL (ref 4.22–5.81)
RDW: 14.4 % (ref 11.5–15.5)
Smear Review: NORMAL
WBC Count: 4.2 10*3/uL (ref 4.0–10.5)
nRBC: 0 % (ref 0.0–0.2)

## 2021-08-15 LAB — CMP (CANCER CENTER ONLY)
ALT: 18 U/L (ref 0–44)
AST: 15 U/L (ref 15–41)
Albumin: 3.3 g/dL — ABNORMAL LOW (ref 3.5–5.0)
Alkaline Phosphatase: 82 U/L (ref 38–126)
Anion gap: 6 (ref 5–15)
BUN: 24 mg/dL — ABNORMAL HIGH (ref 8–23)
CO2: 28 mmol/L (ref 22–32)
Calcium: 8.9 mg/dL (ref 8.9–10.3)
Chloride: 102 mmol/L (ref 98–111)
Creatinine: 0.78 mg/dL (ref 0.61–1.24)
GFR, Estimated: >60 mL/min (ref >60)
Glucose, Bld: 129 mg/dL — ABNORMAL HIGH (ref 70–99)
Potassium: 3.3 mmol/L — ABNORMAL LOW (ref 3.5–5.1)
Sodium: 136 mmol/L (ref 135–145)
Total Bilirubin: 0.6 mg/dL (ref 0.3–1.2)
Total Protein: 5.9 g/dL — ABNORMAL LOW (ref 6.5–8.1)

## 2021-08-15 MED ORDER — SODIUM CHLORIDE 0.9% FLUSH
10.0000 mL | Freq: Once | INTRAVENOUS | Status: AC
  Administered 2021-08-15: 10 mL

## 2021-08-15 MED ORDER — HEPARIN SOD (PORK) LOCK FLUSH 100 UNIT/ML IV SOLN
500.0000 [IU] | Freq: Once | INTRAVENOUS | Status: AC
  Administered 2021-08-15: 500 [IU]

## 2021-08-22 ENCOUNTER — Other Ambulatory Visit: Payer: Self-pay

## 2021-08-22 ENCOUNTER — Other Ambulatory Visit: Payer: Medicare Other

## 2021-08-22 ENCOUNTER — Inpatient Hospital Stay: Payer: Medicare Other

## 2021-08-22 DIAGNOSIS — Z5112 Encounter for antineoplastic immunotherapy: Secondary | ICD-10-CM | POA: Diagnosis not present

## 2021-08-22 DIAGNOSIS — C7989 Secondary malignant neoplasm of other specified sites: Secondary | ICD-10-CM | POA: Diagnosis not present

## 2021-08-22 DIAGNOSIS — Z5111 Encounter for antineoplastic chemotherapy: Secondary | ICD-10-CM | POA: Diagnosis not present

## 2021-08-22 DIAGNOSIS — Z95828 Presence of other vascular implants and grafts: Secondary | ICD-10-CM

## 2021-08-22 DIAGNOSIS — C782 Secondary malignant neoplasm of pleura: Secondary | ICD-10-CM | POA: Diagnosis not present

## 2021-08-22 DIAGNOSIS — R5383 Other fatigue: Secondary | ICD-10-CM

## 2021-08-22 DIAGNOSIS — C3492 Malignant neoplasm of unspecified part of left bronchus or lung: Secondary | ICD-10-CM

## 2021-08-22 DIAGNOSIS — Z5189 Encounter for other specified aftercare: Secondary | ICD-10-CM | POA: Diagnosis not present

## 2021-08-22 DIAGNOSIS — C3432 Malignant neoplasm of lower lobe, left bronchus or lung: Secondary | ICD-10-CM | POA: Diagnosis not present

## 2021-08-22 LAB — CBC WITH DIFFERENTIAL (CANCER CENTER ONLY)
Abs Immature Granulocytes: 0.06 10*3/uL (ref 0.00–0.07)
Basophils Absolute: 0.1 10*3/uL (ref 0.0–0.1)
Basophils Relative: 1 %
Eosinophils Absolute: 0 10*3/uL (ref 0.0–0.5)
Eosinophils Relative: 0 %
HCT: 32.8 % — ABNORMAL LOW (ref 39.0–52.0)
Hemoglobin: 11.3 g/dL — ABNORMAL LOW (ref 13.0–17.0)
Immature Granulocytes: 1 %
Lymphocytes Relative: 9 %
Lymphs Abs: 0.5 10*3/uL — ABNORMAL LOW (ref 0.7–4.0)
MCH: 31 pg (ref 26.0–34.0)
MCHC: 34.5 g/dL (ref 30.0–36.0)
MCV: 90.1 fL (ref 80.0–100.0)
Monocytes Absolute: 0.3 10*3/uL (ref 0.1–1.0)
Monocytes Relative: 6 %
Neutro Abs: 4.1 10*3/uL (ref 1.7–7.7)
Neutrophils Relative %: 83 %
Platelet Count: 102 10*3/uL — ABNORMAL LOW (ref 150–400)
RBC: 3.64 MIL/uL — ABNORMAL LOW (ref 4.22–5.81)
RDW: 16.2 % — ABNORMAL HIGH (ref 11.5–15.5)
Smear Review: NORMAL
WBC Count: 5 10*3/uL (ref 4.0–10.5)
nRBC: 0 % (ref 0.0–0.2)

## 2021-08-22 LAB — CMP (CANCER CENTER ONLY)
ALT: 16 U/L (ref 0–44)
AST: 19 U/L (ref 15–41)
Albumin: 3.2 g/dL — ABNORMAL LOW (ref 3.5–5.0)
Alkaline Phosphatase: 84 U/L (ref 38–126)
Anion gap: 4 — ABNORMAL LOW (ref 5–15)
BUN: 11 mg/dL (ref 8–23)
CO2: 30 mmol/L (ref 22–32)
Calcium: 8.5 mg/dL — ABNORMAL LOW (ref 8.9–10.3)
Chloride: 103 mmol/L (ref 98–111)
Creatinine: 0.8 mg/dL (ref 0.61–1.24)
GFR, Estimated: >60 mL/min (ref >60)
Glucose, Bld: 103 mg/dL — ABNORMAL HIGH (ref 70–99)
Potassium: 3.6 mmol/L (ref 3.5–5.1)
Sodium: 137 mmol/L (ref 135–145)
Total Bilirubin: 0.5 mg/dL (ref 0.3–1.2)
Total Protein: 6 g/dL — ABNORMAL LOW (ref 6.5–8.1)

## 2021-08-22 MED ORDER — SODIUM CHLORIDE 0.9% FLUSH
10.0000 mL | Freq: Once | INTRAVENOUS | Status: AC
  Administered 2021-08-22: 10 mL

## 2021-08-22 MED ORDER — HEPARIN SOD (PORK) LOCK FLUSH 100 UNIT/ML IV SOLN
500.0000 [IU] | Freq: Once | INTRAVENOUS | Status: AC
  Administered 2021-08-22: 500 [IU]

## 2021-08-28 ENCOUNTER — Other Ambulatory Visit: Payer: Self-pay

## 2021-08-28 ENCOUNTER — Encounter: Payer: Self-pay | Admitting: Internal Medicine

## 2021-08-28 ENCOUNTER — Other Ambulatory Visit: Payer: Medicare Other

## 2021-08-28 ENCOUNTER — Encounter: Payer: Self-pay | Admitting: *Deleted

## 2021-08-28 ENCOUNTER — Inpatient Hospital Stay: Payer: Medicare Other | Admitting: Dietician

## 2021-08-28 ENCOUNTER — Inpatient Hospital Stay: Payer: Medicare Other

## 2021-08-28 ENCOUNTER — Inpatient Hospital Stay (HOSPITAL_BASED_OUTPATIENT_CLINIC_OR_DEPARTMENT_OTHER): Payer: Medicare Other | Admitting: Internal Medicine

## 2021-08-28 VITALS — BP 117/53 | HR 77 | Temp 98.0°F | Resp 18 | Wt 163.3 lb

## 2021-08-28 DIAGNOSIS — Z95828 Presence of other vascular implants and grafts: Secondary | ICD-10-CM

## 2021-08-28 DIAGNOSIS — Z5189 Encounter for other specified aftercare: Secondary | ICD-10-CM | POA: Diagnosis not present

## 2021-08-28 DIAGNOSIS — Z5111 Encounter for antineoplastic chemotherapy: Secondary | ICD-10-CM | POA: Diagnosis not present

## 2021-08-28 DIAGNOSIS — C782 Secondary malignant neoplasm of pleura: Secondary | ICD-10-CM | POA: Diagnosis not present

## 2021-08-28 DIAGNOSIS — C3492 Malignant neoplasm of unspecified part of left bronchus or lung: Secondary | ICD-10-CM

## 2021-08-28 DIAGNOSIS — Z5112 Encounter for antineoplastic immunotherapy: Secondary | ICD-10-CM | POA: Diagnosis not present

## 2021-08-28 DIAGNOSIS — C3490 Malignant neoplasm of unspecified part of unspecified bronchus or lung: Secondary | ICD-10-CM | POA: Diagnosis not present

## 2021-08-28 DIAGNOSIS — C7989 Secondary malignant neoplasm of other specified sites: Secondary | ICD-10-CM | POA: Diagnosis not present

## 2021-08-28 DIAGNOSIS — C3432 Malignant neoplasm of lower lobe, left bronchus or lung: Secondary | ICD-10-CM | POA: Diagnosis not present

## 2021-08-28 LAB — CMP (CANCER CENTER ONLY)
ALT: 15 U/L (ref 0–44)
AST: 15 U/L (ref 15–41)
Albumin: 3 g/dL — ABNORMAL LOW (ref 3.5–5.0)
Alkaline Phosphatase: 81 U/L (ref 38–126)
Anion gap: 4 — ABNORMAL LOW (ref 5–15)
BUN: 12 mg/dL (ref 8–23)
CO2: 29 mmol/L (ref 22–32)
Calcium: 8.5 mg/dL — ABNORMAL LOW (ref 8.9–10.3)
Chloride: 104 mmol/L (ref 98–111)
Creatinine: 0.78 mg/dL (ref 0.61–1.24)
GFR, Estimated: >60 mL/min (ref >60)
Glucose, Bld: 176 mg/dL — ABNORMAL HIGH (ref 70–99)
Potassium: 3.8 mmol/L (ref 3.5–5.1)
Sodium: 137 mmol/L (ref 135–145)
Total Bilirubin: 0.3 mg/dL (ref 0.3–1.2)
Total Protein: 5.9 g/dL — ABNORMAL LOW (ref 6.5–8.1)

## 2021-08-28 LAB — CBC WITH DIFFERENTIAL (CANCER CENTER ONLY)
Abs Immature Granulocytes: 0.03 10*3/uL (ref 0.00–0.07)
Basophils Absolute: 0 10*3/uL (ref 0.0–0.1)
Basophils Relative: 1 %
Eosinophils Absolute: 0 10*3/uL (ref 0.0–0.5)
Eosinophils Relative: 0 %
HCT: 30.9 % — ABNORMAL LOW (ref 39.0–52.0)
Hemoglobin: 10.3 g/dL — ABNORMAL LOW (ref 13.0–17.0)
Immature Granulocytes: 1 %
Lymphocytes Relative: 17 %
Lymphs Abs: 0.5 10*3/uL — ABNORMAL LOW (ref 0.7–4.0)
MCH: 30.4 pg (ref 26.0–34.0)
MCHC: 33.3 g/dL (ref 30.0–36.0)
MCV: 91.2 fL (ref 80.0–100.0)
Monocytes Absolute: 0.2 10*3/uL (ref 0.1–1.0)
Monocytes Relative: 8 %
Neutro Abs: 2.3 10*3/uL (ref 1.7–7.7)
Neutrophils Relative %: 73 %
Platelet Count: 144 10*3/uL — ABNORMAL LOW (ref 150–400)
RBC: 3.39 MIL/uL — ABNORMAL LOW (ref 4.22–5.81)
RDW: 17.2 % — ABNORMAL HIGH (ref 11.5–15.5)
WBC Count: 3.1 10*3/uL — ABNORMAL LOW (ref 4.0–10.5)
nRBC: 0 % (ref 0.0–0.2)

## 2021-08-28 MED ORDER — SODIUM CHLORIDE 0.9 % IV SOLN
350.0000 mg | Freq: Once | INTRAVENOUS | Status: AC
  Administered 2021-08-28: 350 mg via INTRAVENOUS
  Filled 2021-08-28: qty 7

## 2021-08-28 MED ORDER — SODIUM CHLORIDE 0.9 % IV SOLN: Freq: Once | INTRAVENOUS | Status: AC

## 2021-08-28 MED ORDER — SODIUM CHLORIDE 0.9 % IV SOLN
175.0000 mg/m2 | Freq: Once | INTRAVENOUS | Status: AC
  Administered 2021-08-28: 354 mg via INTRAVENOUS
  Filled 2021-08-28: qty 59

## 2021-08-28 MED ORDER — HEPARIN SOD (PORK) LOCK FLUSH 100 UNIT/ML IV SOLN
500.0000 [IU] | Freq: Once | INTRAVENOUS | Status: AC | PRN
  Administered 2021-08-28: 500 [IU]

## 2021-08-28 MED ORDER — SODIUM CHLORIDE 0.9 % IV SOLN
150.0000 mg | Freq: Once | INTRAVENOUS | Status: AC
  Administered 2021-08-28: 150 mg via INTRAVENOUS
  Filled 2021-08-28: qty 150

## 2021-08-28 MED ORDER — FAMOTIDINE IN NACL 20-0.9 MG/50ML-% IV SOLN
20.0000 mg | Freq: Once | INTRAVENOUS | Status: AC
  Administered 2021-08-28: 20 mg via INTRAVENOUS
  Filled 2021-08-28: qty 50

## 2021-08-28 MED ORDER — PALONOSETRON HCL INJECTION 0.25 MG/5ML
0.2500 mg | Freq: Once | INTRAVENOUS | Status: AC
  Administered 2021-08-28: 0.25 mg via INTRAVENOUS
  Filled 2021-08-28: qty 5

## 2021-08-28 MED ORDER — SODIUM CHLORIDE 0.9% FLUSH
10.0000 mL | Freq: Once | INTRAVENOUS | Status: AC
  Administered 2021-08-28: 10 mL

## 2021-08-28 MED ORDER — SODIUM CHLORIDE 0.9 % IV SOLN
440.5000 mg | Freq: Once | INTRAVENOUS | Status: AC
  Administered 2021-08-28: 440 mg via INTRAVENOUS
  Filled 2021-08-28: qty 44

## 2021-08-28 MED ORDER — SODIUM CHLORIDE 0.9% FLUSH
10.0000 mL | INTRAVENOUS | Status: DC | PRN
  Administered 2021-08-28: 10 mL

## 2021-08-28 MED ORDER — DIPHENHYDRAMINE HCL 50 MG/ML IJ SOLN
50.0000 mg | Freq: Once | INTRAMUSCULAR | Status: AC
  Administered 2021-08-28: 50 mg via INTRAVENOUS
  Filled 2021-08-28: qty 1

## 2021-08-28 MED ORDER — SODIUM CHLORIDE 0.9 % IV SOLN
10.0000 mg | Freq: Once | INTRAVENOUS | Status: AC
  Administered 2021-08-28: 10 mg via INTRAVENOUS
  Filled 2021-08-28: qty 10

## 2021-08-28 NOTE — Patient Instructions (Signed)
[facility]  Discharge Instructions: Thank you for choosing [facility] to provide your oncology and hematology care.   If you have a lab appointment with the [facility], please go directly to the [facility] and check in at the registration area.   Wear comfortable clothing and clothing appropriate for easy access to any Portacath or PICC line.   We strive to give you quality time with your provider. You may need to reschedule your appointment if you arrive late (15 or more minutes).  Arriving late affects you and other patients whose appointments are after yours.  Also, if you miss three or more appointments without notifying the office, you may be dismissed from the clinic at the provider's discretion.      For prescription refill requests, have your pharmacy contact our office and allow 72 hours for refills to be completed.    Today you received the following chemotherapy and/or immunotherapy agents: Libtayo, Paclitaxel, Carboplatin.       To help prevent nausea and vomiting after your treatment, we encourage you to take your nausea medication as directed.  BELOW ARE SYMPTOMS THAT SHOULD BE REPORTED IMMEDIATELY: *FEVER GREATER THAN 100.4 F (38 C) OR HIGHER *CHILLS OR SWEATING *NAUSEA AND VOMITING THAT IS NOT CONTROLLED WITH YOUR NAUSEA MEDICATION *UNUSUAL SHORTNESS OF BREATH *UNUSUAL BRUISING OR BLEEDING *URINARY PROBLEMS (pain or burning when urinating, or frequent urination) *BOWEL PROBLEMS (unusual diarrhea, constipation, pain near the anus) TENDERNESS IN MOUTH AND THROAT WITH OR WITHOUT PRESENCE OF ULCERS (sore throat, sores in mouth, or a toothache) UNUSUAL RASH, SWELLING OR PAIN  UNUSUAL VAGINAL DISCHARGE OR ITCHING   Items with * indicate a potential emergency and should be followed up as soon as possible or go to the Emergency Department if any problems should occur.  Please show the CHEMOTHERAPY ALERT CARD or IMMUNOTHERAPY  ALERT CARD at check-in to the Emergency Department and triage nurse.  Should you have questions after your visit or need to cancel or reschedule your appointment, please contact [facility]  Dept: [phone]  and follow the prompts.  Office hours are 8:00 a.m. to 4:30 p.m. Monday - Friday. Please note that voicemails left after 4:00 p.m. may not be returned until the following business day.  We are closed weekends and major holidays. You have access to a nurse at all times for urgent questions. Please call the main number to the clinic Dept: [phone] and follow the prompts.   For any non-urgent questions, you may also contact your provider using MyChart. We now offer e-Visits for anyone 84 and older to request care online for non-urgent symptoms. For details visit [email or web address].   Also download the MyChart app! Go to the app store, search "MyChart", open the app, select [facility], and log in with your MyChart username and password.  Due to Covid, a mask is required upon entering the hospital/clinic. If you do not have a mask, one will be given to you upon arrival. For doctor visits, patients may have 1 support person aged 64 or older with them. For treatment visits, patients cannot have anyone with them due to current Covid guidelines and our immunocompromised population.   Cemiplimab injection What is this medication? CEMIPLIMAB (se mip li mab) is a monoclonal antibody. It treats certain types of cancer. Some of the cancers treated are cutaneous squamous cell carcinoma and basal cell carcinoma. This medicine may be used for other purposes; ask your health  care provider or pharmacist if you have questions. COMMON BRAND NAME(S): LIBTAYO What should I tell my care team before I take this medication? They need to know if you have any of these conditions: autoimmune diseases like Crohn's disease, ulcerative colitis, or lupus have had or planning to have  an allogeneic stem cell transplant (uses someone else's stem cells) history of organ transplant nervous system problems like myasthenia gravis or Guillain-Barre syndrome an unusual or allergic reaction to cemiplimab, other drugs, foods, dyes, or preservatives pregnant or trying to get pregnant breast-feeding How should I use this medication? This medicine is for infusion into a vein. It is given by a health care professional in a hospital or clinic setting. A special MedGuide will be given to you before each treatment. Be sure to read this information carefully each time. Talk to your pediatrician regarding the use of this medicine in children. Special care may be needed. Overdosage: If you think you have taken too much of this medicine contact a poison control center or emergency room at once. NOTE: This medicine is only for you. Do not share this medicine with others. What if I miss a dose? It is important not to miss your dose. Call your doctor or health care professional if you are unable to keep an appointment. What may interact with this medication? Interactions have not been studied. This list may not describe all possible interactions. Give your health care provider a list of all the medicines, herbs, non-prescription drugs, or dietary supplements you use. Also tell them if you smoke, drink alcohol, or use illegal drugs. Some items may interact with your medicine. What should I watch for while using this medication? Your condition will be monitored carefully while you are receiving this medicine. You may need blood work done while you are taking this medicine. Do not become pregnant while taking this medicine or for at least 4 months after stopping it. Women should inform their doctor if they wish to become pregnant or think they might be pregnant. There is a potential for serious side effects to an unborn child. Talk to your health care professional or pharmacist for more information. Do  not breast-feed an infant while taking this medicine or for at least 4 months after the last dose. What side effects may I notice from receiving this medication? Side effects that you should report to your doctor or health care professional as soon as possible: allergic reactions like skin rash, itching or hives; swelling of the face, lips, or tongue black, tarry stools bloody or watery diarrhea breathing problems changes in vision changes in voice chest pain or chest tightness chills cough dizziness fast or irregular heart beat feeling faint or lightheaded hair loss increased hunger or thirst muscle weakness persistent headache redness, blistering, peeling or loosening of the skin, including inside the mouth signs and symptoms of kidney injury like trouble passing urine or change in the amount of urine signs and symptoms of liver injury like dark yellow or brown urine; general ill feeling or flu-like symptoms; light-colored stools; loss of appetite; nausea; right upper belly pain; unusually weak or tired; yellowing of the eyes or skin stomach pain unusual bleeding or bruising weight gain or weight loss unusual sweating Side effects that usually do not require medical attention (report these to your doctor or health care professional if they continue or are bothersome): bone pain constipation muscle pain tiredness This list may not describe all possible side effects. Call your doctor for medical advice  about side effects. You may report side effects to FDA at 1-800-FDA-1088. Where should I keep my medication? This drug is given in a hospital or clinic and will not be stored at home. NOTE: This sheet is a summary. It may not cover all possible information. If you have questions about this medicine, talk to your doctor, pharmacist, or health care provider.  2022 Elsevier/Gold Standard (2020-12-11 00:00:00)  Paclitaxel injection What is this medication? PACLITAXEL (PAK li TAX el)  is a chemotherapy drug. It targets fast dividing cells, like cancer cells, and causes these cells to die. This medicine is used to treat ovarian cancer, breast cancer, lung cancer, Kaposi's sarcoma, and other cancers. This medicine may be used for other purposes; ask your health care provider or pharmacist if you have questions. COMMON BRAND NAME(S): Onxol, Taxol What should I tell my care team before I take this medication? They need to know if you have any of these conditions: history of irregular heartbeat liver disease low blood counts, like low white cell, platelet, or red cell counts lung or breathing disease, like asthma tingling of the fingers or toes, or other nerve disorder an unusual or allergic reaction to paclitaxel, alcohol, polyoxyethylated castor oil, other chemotherapy, other medicines, foods, dyes, or preservatives pregnant or trying to get pregnant breast-feeding How should I use this medication? This drug is given as an infusion into a vein. It is administered in a hospital or clinic by a specially trained health care professional. Talk to your pediatrician regarding the use of this medicine in children. Special care may be needed. Overdosage: If you think you have taken too much of this medicine contact a poison control center or emergency room at once. NOTE: This medicine is only for you. Do not share this medicine with others. What if I miss a dose? It is important not to miss your dose. Call your doctor or health care professional if you are unable to keep an appointment. What may interact with this medication? Do not take this medicine with any of the following medications: live virus vaccines This medicine may also interact with the following medications: antiviral medicines for hepatitis, HIV or AIDS certain antibiotics like erythromycin and clarithromycin certain medicines for fungal infections like ketoconazole and itraconazole certain medicines for seizures  like carbamazepine, phenobarbital, phenytoin gemfibrozil nefazodone rifampin St. John's wort This list may not describe all possible interactions. Give your health care provider a list of all the medicines, herbs, non-prescription drugs, or dietary supplements you use. Also tell them if you smoke, drink alcohol, or use illegal drugs. Some items may interact with your medicine. What should I watch for while using this medication? Your condition will be monitored carefully while you are receiving this medicine. You will need important blood work done while you are taking this medicine. This medicine can cause serious allergic reactions. To reduce your risk you will need to take other medicine(s) before treatment with this medicine. If you experience allergic reactions like skin rash, itching or hives, swelling of the face, lips, or tongue, tell your doctor or health care professional right away. In some cases, you may be given additional medicines to help with side effects. Follow all directions for their use. This drug may make you feel generally unwell. This is not uncommon, as chemotherapy can affect healthy cells as well as cancer cells. Report any side effects. Continue your course of treatment even though you feel ill unless your doctor tells you to stop. Call your doctor  or health care professional for advice if you get a fever, chills or sore throat, or other symptoms of a cold or flu. Do not treat yourself. This drug decreases your body's ability to fight infections. Try to avoid being around people who are sick. This medicine may increase your risk to bruise or bleed. Call your doctor or health care professional if you notice any unusual bleeding. Be careful brushing and flossing your teeth or using a toothpick because you may get an infection or bleed more easily. If you have any dental work done, tell your dentist you are receiving this medicine. Avoid taking products that contain aspirin,  acetaminophen, ibuprofen, naproxen, or ketoprofen unless instructed by your doctor. These medicines may hide a fever. Do not become pregnant while taking this medicine. Women should inform their doctor if they wish to become pregnant or think they might be pregnant. There is a potential for serious side effects to an unborn child. Talk to your health care professional or pharmacist for more information. Do not breast-feed an infant while taking this medicine. Men are advised not to father a child while receiving this medicine. This product may contain alcohol. Ask your pharmacist or healthcare provider if this medicine contains alcohol. Be sure to tell all healthcare providers you are taking this medicine. Certain medicines, like metronidazole and disulfiram, can cause an unpleasant reaction when taken with alcohol. The reaction includes flushing, headache, nausea, vomiting, sweating, and increased thirst. The reaction can last from 30 minutes to several hours. What side effects may I notice from receiving this medication? Side effects that you should report to your doctor or health care professional as soon as possible: allergic reactions like skin rash, itching or hives, swelling of the face, lips, or tongue breathing problems changes in vision fast, irregular heartbeat high or low blood pressure mouth sores pain, tingling, numbness in the hands or feet signs of decreased platelets or bleeding - bruising, pinpoint red spots on the skin, black, tarry stools, blood in the urine signs of decreased red blood cells - unusually weak or tired, feeling faint or lightheaded, falls signs of infection - fever or chills, cough, sore throat, pain or difficulty passing urine signs and symptoms of liver injury like dark yellow or brown urine; general ill feeling or flu-like symptoms; light-colored stools; loss of appetite; nausea; right upper belly pain; unusually weak or tired; yellowing of the eyes or  skin swelling of the ankles, feet, hands unusually slow heartbeat Side effects that usually do not require medical attention (report to your doctor or health care professional if they continue or are bothersome): diarrhea hair loss loss of appetite muscle or joint pain nausea, vomiting pain, redness, or irritation at site where injected tiredness This list may not describe all possible side effects. Call your doctor for medical advice about side effects. You may report side effects to FDA at 1-800-FDA-1088. Where should I keep my medication? This drug is given in a hospital or clinic and will not be stored at home. NOTE: This sheet is a summary. It may not cover all possible information. If you have questions about this medicine, talk to your doctor, pharmacist, or health care provider.  2022 Elsevier/Gold Standard (2020-12-11 00:00:00)  Carboplatin injection What is this medication? CARBOPLATIN (KAR boe pla tin) is a chemotherapy drug. It targets fast dividing cells, like cancer cells, and causes these cells to die. This medicine is used to treat ovarian cancer and many other cancers. This medicine may be used  for other purposes; ask your health care provider or pharmacist if you have questions. COMMON BRAND NAME(S): Paraplatin What should I tell my care team before I take this medication? They need to know if you have any of these conditions: blood disorders hearing problems kidney disease recent or ongoing radiation therapy an unusual or allergic reaction to carboplatin, cisplatin, other chemotherapy, other medicines, foods, dyes, or preservatives pregnant or trying to get pregnant breast-feeding How should I use this medication? This drug is usually given as an infusion into a vein. It is administered in a hospital or clinic by a specially trained health care professional. Talk to your pediatrician regarding the use of this medicine in children. Special care may be  needed. Overdosage: If you think you have taken too much of this medicine contact a poison control center or emergency room at once. NOTE: This medicine is only for you. Do not share this medicine with others. What if I miss a dose? It is important not to miss a dose. Call your doctor or health care professional if you are unable to keep an appointment. What may interact with this medication? medicines for seizures medicines to increase blood counts like filgrastim, pegfilgrastim, sargramostim some antibiotics like amikacin, gentamicin, neomycin, streptomycin, tobramycin vaccines Talk to your doctor or health care professional before taking any of these medicines: acetaminophen aspirin ibuprofen ketoprofen naproxen This list may not describe all possible interactions. Give your health care provider a list of all the medicines, herbs, non-prescription drugs, or dietary supplements you use. Also tell them if you smoke, drink alcohol, or use illegal drugs. Some items may interact with your medicine. What should I watch for while using this medication? Your condition will be monitored carefully while you are receiving this medicine. You will need important blood work done while you are taking this medicine. This drug may make you feel generally unwell. This is not uncommon, as chemotherapy can affect healthy cells as well as cancer cells. Report any side effects. Continue your course of treatment even though you feel ill unless your doctor tells you to stop. In some cases, you may be given additional medicines to help with side effects. Follow all directions for their use. Call your doctor or health care professional for advice if you get a fever, chills or sore throat, or other symptoms of a cold or flu. Do not treat yourself. This drug decreases your body's ability to fight infections. Try to avoid being around people who are sick. This medicine may increase your risk to bruise or bleed. Call  your doctor or health care professional if you notice any unusual bleeding. Be careful brushing and flossing your teeth or using a toothpick because you may get an infection or bleed more easily. If you have any dental work done, tell your dentist you are receiving this medicine. Avoid taking products that contain aspirin, acetaminophen, ibuprofen, naproxen, or ketoprofen unless instructed by your doctor. These medicines may hide a fever. Do not become pregnant while taking this medicine. Women should inform their doctor if they wish to become pregnant or think they might be pregnant. There is a potential for serious side effects to an unborn child. Talk to your health care professional or pharmacist for more information. Do not breast-feed an infant while taking this medicine. What side effects may I notice from receiving this medication? Side effects that you should report to your doctor or health care professional as soon as possible: allergic reactions like skin rash, itching  or hives, swelling of the face, lips, or tongue signs of infection - fever or chills, cough, sore throat, pain or difficulty passing urine signs of decreased platelets or bleeding - bruising, pinpoint red spots on the skin, black, tarry stools, nosebleeds signs of decreased red blood cells - unusually weak or tired, fainting spells, lightheadedness breathing problems changes in hearing changes in vision chest pain high blood pressure low blood counts - This drug may decrease the number of white blood cells, red blood cells and platelets. You may be at increased risk for infections and bleeding. nausea and vomiting pain, swelling, redness or irritation at the injection site pain, tingling, numbness in the hands or feet problems with balance, talking, walking trouble passing urine or change in the amount of urine Side effects that usually do not require medical attention (report to your doctor or health care professional  if they continue or are bothersome): hair loss loss of appetite metallic taste in the mouth or changes in taste This list may not describe all possible side effects. Call your doctor for medical advice about side effects. You may report side effects to FDA at 1-800-FDA-1088. Where should I keep my medication? This drug is given in a hospital or clinic and will not be stored at home. NOTE: This sheet is a summary. It may not cover all possible information. If you have questions about this medicine, talk to your doctor, pharmacist, or health care provider.  2022 Elsevier/Gold Standard (2007-09-01 00:00:00)

## 2021-08-28 NOTE — Progress Notes (Signed)
Nutrition Assessment   Reason for Assessment: MST   ASSESSMENT: 76 year old male with stage IV NSCLC diagnosed March 2023. Patient undergoing palliative radiation therapy with Dr. Sondra Come. He is receiving carboplatin/paclitaxel/cemiplimab q21d. Patient is under the care of Dr. Earlie Server.  Past medical history includes pneumonia, Covid (12/22)  Met with patient in infusion. He reports his appetite has improved and "eating really well over the last 5 days." Yesterday he consumed an Ensure for breakfast, had potatoes and pintos for lunch, mac/cheese, cabbage, pintos, cornbread for dinner. Patient recalls snacking on "junk food" in between meals consisting of puddings, oatmeal cookies, peanut butter crackers, and fruits. He is drinking 2-3 Ensure. Patient reports diarrhea has resolved. This lasted for 4-5 days following his first treatment. He denies nausea, vomiting, constipation.   Nutrition Focused Physical Exam:   Orbital Region: moderate Buccal Region: moderate  Upper Arm Region: Therapist, art and Lumbar Region: UTA Temple Region: mild Clavicle Bone Region: mild Shoulder and Acromion Bone Region: moderate Scapular Bone Region: UTA Dorsal Hand: moderate Patellar Region: UTA Anterior Thigh Region: UTA Posterior Calf Region: UTA Hair: reviewed Eyes: reviewed Mouth: reviewed Skin: reviewed Nails: reviewed   Medications: compazine, proscar   Labs: glucose 176   Anthropometrics: Patient is 12% (22 lbs) under reported usual weight which is significant.   Height: 6'2" Weight: 163 lb 5 oz (08/28/21) UBW: 185 lb (per pt prior to Covid) BMI: 20.97  5/4 - 161 lb 4.8 oz 4/10 - 168 lb 6.4 oz 3/27 - 172 lb 6.4 oz   NUTRITION DIAGNOSIS: Unintended weight loss related to cancer and associated treatment side effects as evidenced by reported episodes of diarrhea, 4% weight loss in 4 weeks following first chemotherapy which is insignificant for time frame, however concerning. Weights are  trending up.    INTERVENTION:  Educated on importance of increased calorie/protein needs to maintain weights/strength Encouraged small frequent meals and snacks - handout with ideas provided  Discussed foods with protein, educated to include protein source with meals/snacks - list of foods provided Continue drinking 2-3 Ensure, suggested switching to Ensure Plus/equivalent for added calories  Discussed strategies for diarrhea, handout with tips provided Contact information provided    MONITORING, EVALUATION, GOAL: weight trends, intake    Next Visit: To be scheduled as needed

## 2021-08-28 NOTE — Progress Notes (Signed)
[facility] [phone]   [phone]  OFFICE PROGRESS NOTE  [name], PA-C [address] [place] [address]  DIAGNOSIS: Stage IV (T3, N1, M1b) non-small cell lung cancer favoring squamous cell carcinoma presented with superior segment left lower lobe lung mass with invasion of the chest wall and destruction of the posterior left sixth and seventh ribs as well as left hilar adenopathy and suspicious pleural metastasis diagnosed in March 2023  PRIOR THERAPY: Palliative radiotherapy to the left lower lobe lung mass with chest wall invasion under the care of Dr. Sondra Come  CURRENT THERAPY: Palliative systemic chemotherapy with carboplatin for AUC of 5, paclitaxel 175 Mg/M2 and Libtayo (Cempilimab) 350 Mg IV every 3 weeks with Neulasta support.  First dose was given on July 18, 2021.  Status post 2 cycles  INTERVAL HISTORY: Edward Beck 76 y.o. male returns to the clinic today for follow-up visit accompanied by his wife.  The patient is feeling fine today with no concerning complaints except for several episodes of diarrhea that last for around 4 days after his treatment.  He did not have any diarrhea or other gastrointestinal symptoms in the last 2 weeks.  He has some intermittent pain on the left side of the chest with no cough, shortness of breath or hemoptysis.  He has no nausea, vomiting, diarrhea or constipation.  He denied having any significant weight loss or night sweats.  He has no headache or visual changes.  He is here today for evaluation before starting cycle #3 of his treatment.  MEDICAL HISTORY: Past Medical History:  Diagnosis Date   Condyloma acuminatum of scrotum    COVID 2021   December 2022 - mild   Lung cancer ([place])    Pneumonia    Scrotal lesion     ALLERGIES:  is allergic to tamiflu [oseltamivir phosphate].  MEDICATIONS:  Current Outpatient Medications  Medication Sig Dispense Refill   finasteride (PROSCAR) 5  MG tablet Take 1 tablet (5 mg total) by mouth daily. 90 tablet 3   lidocaine-prilocaine (EMLA) cream Apply to the Port-A-Cath site 30 minutes before chemotherapy. 30 g 0   naproxen sodium (ALEVE) 220 MG tablet Take 220 mg by mouth 2 (two) times daily as needed (pain).     prochlorperazine (COMPAZINE) 10 MG tablet Take 1 tablet (10 mg total) by mouth every 6 (six) hours as needed for nausea or vomiting. 30 tablet 0   triamcinolone cream (KENALOG) 0.1 % Apply 1 application. topically 2 (two) times daily as needed (dry skin).     umeclidinium-vilanterol (ANORO ELLIPTA) 62.5-25 MCG/ACT AEPB Inhale 1 puff into the lungs daily. 7 each 0   No current facility-administered medications for this visit.    SURGICAL HISTORY:  Past Surgical History:  Procedure Laterality Date   BRONCHIAL BIOPSY  07/02/2021   Procedure: BRONCHIAL BIOPSIES;  Surgeon: Garner Nash, DO;  Location: [facility];  Service: Pulmonary;;   BRONCHIAL BRUSHINGS  07/02/2021   Procedure: BRONCHIAL BRUSHINGS;  Surgeon: Garner Nash, DO;  Location: [facility];  Service: Pulmonary;;   BRONCHIAL NEEDLE ASPIRATION BIOPSY  07/02/2021   Procedure: BRONCHIAL NEEDLE ASPIRATION BIOPSIES;  Surgeon: Garner Nash, DO;  Location: [facility];  Service: Pulmonary;;   HERNIA REPAIR  2009   left inguinal   IR IMAGING GUIDED PORT INSERTION  07/16/2021   SCROTAL EXPLORATION  11/21/2011   Procedure: SCROTUM EXPLORATION;  Surgeon: Hanley Ben, MD;  Location: [facility];  Service: Urology;  Laterality: N/A;  EXCISION, BIOPSY SCROTAL CONDYLOMA    VIDEO BRONCHOSCOPY WITH ENDOBRONCHIAL ULTRASOUND Bilateral 07/02/2021   Procedure: VIDEO BRONCHOSCOPY WITH ENDOBRONCHIAL ULTRASOUND;  Surgeon: Garner Nash, DO;  Location: [facility];  Service: Pulmonary;  Laterality: Bilateral;    REVIEW OF SYSTEMS:  A comprehensive review of systems was negative except for: Constitutional: positive for fatigue Respiratory: positive for pleurisy/chest  pain Gastrointestinal: positive for diarrhea   PHYSICAL EXAMINATION: General appearance: alert, cooperative, fatigued, and no distress Head: Normocephalic, without obvious abnormality, atraumatic Neck: no adenopathy, no JVD, supple, symmetrical, trachea midline, and thyroid not enlarged, symmetric, no tenderness/mass/nodules Lymph nodes: Cervical, supraclavicular, and axillary nodes normal. Resp: clear to auscultation bilaterally Back: symmetric, no curvature. ROM normal. No CVA tenderness. Cardio: regular rate and rhythm, S1, S2 normal, no murmur, click, rub or gallop GI: soft, non-tender; bowel sounds normal; no masses,  no organomegaly Extremities: extremities normal, atraumatic, no cyanosis or edema  ECOG PERFORMANCE STATUS: 1 - Symptomatic but completely ambulatory  Blood pressure (!) 117/53, pulse 77, temperature 98 F (36.7 C), temperature source Tympanic, resp. rate 18, weight 163 lb 5 oz (74.1 kg), SpO2 98 %.  LABORATORY DATA: Lab Results  Component Value Date   WBC 3.1 (L) 08/28/2021   HGB 10.3 (L) 08/28/2021   HCT 30.9 (L) 08/28/2021   MCV 91.2 08/28/2021   PLT 144 (L) 08/28/2021      Chemistry      Component Value Date/Time   NA 137 08/28/2021 0837   K 3.8 08/28/2021 0837   CL 104 08/28/2021 0837   CO2 29 08/28/2021 0837   BUN 12 08/28/2021 0837   CREATININE 0.78 08/28/2021 0837      Component Value Date/Time   CALCIUM 8.5 (L) 08/28/2021 0837   ALKPHOS 81 08/28/2021 0837   AST 15 08/28/2021 0837   ALT 15 08/28/2021 0837   BILITOT 0.3 08/28/2021 0837       RADIOGRAPHIC STUDIES: No results found.  ASSESSMENT AND PLAN: This is a very pleasant 76 years old white male with Stage IV (T3, N1, M1b) non-small cell lung cancer favoring squamous cell carcinoma presented with superior segment left lower lobe lung mass with invasion of the chest wall and destruction of the posterior left sixth and seventh ribs as well as left hilar adenopathy and suspicious pleural  metastasis diagnosed in March 2023 He is currently undergoing palliative radiotherapy to the left lower lobe lung mass with chest wall invasion under the care of Dr. Sondra Come. He is also undergoing systemic chemotherapy with carboplatin for AUC of 5, paclitaxel 175 Mg/M2 and Libtayo (Cempilimab) 350 Mg IV every 3 weeks.  He started the first dose of his treatment on July 18, 2021.  Status post 2 cycles. The patient has been tolerating this treatment fairly well except for few episodes of diarrhea that lasted for several days after his treatment. I recommended for the patient to proceed with cycle #3 today as planned. I also advised him to call immediately if he has a lot of diarrhea after his treatment.  I may consider him for some steroid treatment at that time. I will see the patient back for follow-up visit in 3 weeks for evaluation with repeat CT scan of the chest, abdomen and pelvis for restaging of his disease. The patient was advised to call immediately if he has any concerning symptoms in the interval. The patient voices understanding of current disease status and treatment options and is in agreement with the current  care plan.  All questions were answered. The patient knows to call the clinic with any problems, questions or concerns. We can certainly see the patient much sooner if necessary.  The total time spent in the appointment was 20 minutes.  Disclaimer: This note was dictated with voice recognition software. Similar sounding words can inadvertently be transcribed and may not be corrected upon review.

## 2021-08-28 NOTE — Progress Notes (Signed)
Oncology Nurse Navigator Documentation     08/28/2021    1:00 PM 07/19/2021    3:00 PM 07/16/2021   10:00 AM 07/15/2021    8:00 AM 07/11/2021   12:00 PM 07/11/2021   10:00 AM  Oncology Nurse Navigator Flowsheets  Abnormal Finding Date      06/07/2021  Confirmed Diagnosis Date      07/02/2021  Diagnosis Status    Confirmed Diagnosis Complete  Pending Molecular Studies  Planned Course of Treatment    Chemo/Radiation Concurrent    Phase of Treatment   Radiation Radiation    Chemotherapy Actual Start Date:    07/18/2021    Radiation Actual Start Date:   07/22/2021     Navigator Follow Up Date: 09/19/2021   07/24/2021  07/15/2021  Navigator Follow Up Reason: Follow-up Appointment   Follow-up Appointment  Appointment Review  Navigator Location [facility]  Referral Date to RadOnc/MedOnc      07/10/2021  Navigator Encounter Type Appt/Treatment Plan Review/I followed up on Edward Beck tx plan. He is currently scheduled for his plan of care and adhere to tx.  Appt/Treatment Plan Review Appt/Treatment Plan Review Appt/Treatment Plan Review  Appt/Treatment Plan Review  Treatment Initiated Date    07/18/2021    Patient Visit Type Other   Other Other MedOnc;Initial  Treatment Phase Treatment Treatment Pre-Tx/Tx Discussion Pre-Tx/Tx Discussion Pre-Tx/Tx Discussion Pre-Tx/Tx Discussion  Barriers/Navigation Needs Coordination of Care Coordination of Care Coordination of Care Coordination of Care Coordination of Care Coordination of Care  Interventions Coordination of Care Coordination of Care Coordination of Care Coordination of Care Coordination of Care Coordination of Care  Acuity Level 2-Minimal Needs (1-2 Barriers Identified) Level 2-Minimal Needs (1-2 Barriers Identified) Level 2-Minimal Needs (1-2 Barriers Identified) Level 2-Minimal Needs (1-2 Barriers Identified) Level 2-Minimal Needs (1-2 Barriers Identified) Level  2-Minimal Needs (1-2 Barriers Identified)  Coordination of Care Other Other Other Other Pathology Pathology  Time Spent with Patient 30 30 30 30 30  30

## 2021-08-30 ENCOUNTER — Other Ambulatory Visit: Payer: Self-pay

## 2021-08-30 ENCOUNTER — Inpatient Hospital Stay: Payer: Medicare Other

## 2021-08-30 DIAGNOSIS — Z5111 Encounter for antineoplastic chemotherapy: Secondary | ICD-10-CM | POA: Diagnosis not present

## 2021-08-30 DIAGNOSIS — C3432 Malignant neoplasm of lower lobe, left bronchus or lung: Secondary | ICD-10-CM | POA: Diagnosis not present

## 2021-08-30 DIAGNOSIS — C782 Secondary malignant neoplasm of pleura: Secondary | ICD-10-CM | POA: Diagnosis not present

## 2021-08-30 DIAGNOSIS — C7989 Secondary malignant neoplasm of other specified sites: Secondary | ICD-10-CM | POA: Diagnosis not present

## 2021-08-30 DIAGNOSIS — C3492 Malignant neoplasm of unspecified part of left bronchus or lung: Secondary | ICD-10-CM

## 2021-08-30 DIAGNOSIS — Z5112 Encounter for antineoplastic immunotherapy: Secondary | ICD-10-CM | POA: Diagnosis not present

## 2021-08-30 DIAGNOSIS — Z5189 Encounter for other specified aftercare: Secondary | ICD-10-CM | POA: Diagnosis not present

## 2021-08-30 MED ORDER — PEGFILGRASTIM-CBQV 6 MG/0.6ML ~~LOC~~ SOSY
6.0000 mg | PREFILLED_SYRINGE | Freq: Once | SUBCUTANEOUS | Status: AC
  Administered 2021-08-30: 6 mg via SUBCUTANEOUS
  Filled 2021-08-30: qty 0.6

## 2021-08-30 NOTE — Patient Instructions (Signed)

## 2021-09-05 ENCOUNTER — Ambulatory Visit: Payer: Self-pay | Admitting: Radiation Oncology

## 2021-09-05 ENCOUNTER — Other Ambulatory Visit: Payer: Medicare Other

## 2021-09-05 ENCOUNTER — Inpatient Hospital Stay: Payer: Medicare Other

## 2021-09-05 ENCOUNTER — Other Ambulatory Visit: Payer: Self-pay

## 2021-09-05 DIAGNOSIS — Z5111 Encounter for antineoplastic chemotherapy: Secondary | ICD-10-CM | POA: Insufficient documentation

## 2021-09-05 DIAGNOSIS — Z5112 Encounter for antineoplastic immunotherapy: Secondary | ICD-10-CM | POA: Insufficient documentation

## 2021-09-05 DIAGNOSIS — Z79899 Other long term (current) drug therapy: Secondary | ICD-10-CM | POA: Insufficient documentation

## 2021-09-05 DIAGNOSIS — Z510 Encounter for antineoplastic radiation therapy: Secondary | ICD-10-CM | POA: Insufficient documentation

## 2021-09-05 DIAGNOSIS — C7989 Secondary malignant neoplasm of other specified sites: Secondary | ICD-10-CM | POA: Insufficient documentation

## 2021-09-05 DIAGNOSIS — C3492 Malignant neoplasm of unspecified part of left bronchus or lung: Secondary | ICD-10-CM | POA: Insufficient documentation

## 2021-09-05 DIAGNOSIS — J910 Malignant pleural effusion: Secondary | ICD-10-CM | POA: Insufficient documentation

## 2021-09-05 DIAGNOSIS — C3490 Malignant neoplasm of unspecified part of unspecified bronchus or lung: Secondary | ICD-10-CM

## 2021-09-05 DIAGNOSIS — Z95828 Presence of other vascular implants and grafts: Secondary | ICD-10-CM

## 2021-09-05 DIAGNOSIS — C3432 Malignant neoplasm of lower lobe, left bronchus or lung: Secondary | ICD-10-CM | POA: Insufficient documentation

## 2021-09-05 DIAGNOSIS — R1310 Dysphagia, unspecified: Secondary | ICD-10-CM | POA: Diagnosis not present

## 2021-09-05 DIAGNOSIS — C7951 Secondary malignant neoplasm of bone: Secondary | ICD-10-CM | POA: Insufficient documentation

## 2021-09-05 LAB — CMP (CANCER CENTER ONLY)
ALT: 13 U/L (ref 0–44)
AST: 16 U/L (ref 15–41)
Albumin: 3.4 g/dL — ABNORMAL LOW (ref 3.5–5.0)
Alkaline Phosphatase: 83 U/L (ref 38–126)
Anion gap: 5 (ref 5–15)
BUN: 24 mg/dL — ABNORMAL HIGH (ref 8–23)
CO2: 30 mmol/L (ref 22–32)
Calcium: 9.4 mg/dL (ref 8.9–10.3)
Chloride: 102 mmol/L (ref 98–111)
Creatinine: 1.13 mg/dL (ref 0.61–1.24)
GFR, Estimated: >60 mL/min (ref >60)
Glucose, Bld: 92 mg/dL (ref 70–99)
Potassium: 3.3 mmol/L — ABNORMAL LOW (ref 3.5–5.1)
Sodium: 137 mmol/L (ref 135–145)
Total Bilirubin: 0.4 mg/dL (ref 0.3–1.2)
Total Protein: 6.1 g/dL — ABNORMAL LOW (ref 6.5–8.1)

## 2021-09-05 LAB — CBC WITH DIFFERENTIAL (CANCER CENTER ONLY)
Abs Immature Granulocytes: 0.4 10*3/uL — ABNORMAL HIGH (ref 0.00–0.07)
Basophils Absolute: 0.1 10*3/uL (ref 0.0–0.1)
Basophils Relative: 1 %
Eosinophils Absolute: 0 10*3/uL (ref 0.0–0.5)
Eosinophils Relative: 1 %
HCT: 28.6 % — ABNORMAL LOW (ref 39.0–52.0)
Hemoglobin: 9.7 g/dL — ABNORMAL LOW (ref 13.0–17.0)
Immature Granulocytes: 7 %
Lymphocytes Relative: 13 %
Lymphs Abs: 0.8 10*3/uL (ref 0.7–4.0)
MCH: 31.1 pg (ref 26.0–34.0)
MCHC: 33.9 g/dL (ref 30.0–36.0)
MCV: 91.7 fL (ref 80.0–100.0)
Monocytes Absolute: 0.9 10*3/uL (ref 0.1–1.0)
Monocytes Relative: 17 %
Neutro Abs: 3.5 10*3/uL (ref 1.7–7.7)
Neutrophils Relative %: 61 %
Platelet Count: 117 10*3/uL — ABNORMAL LOW (ref 150–400)
RBC: 3.12 MIL/uL — ABNORMAL LOW (ref 4.22–5.81)
RDW: 17.2 % — ABNORMAL HIGH (ref 11.5–15.5)
Smear Review: NORMAL
WBC Count: 5.7 10*3/uL (ref 4.0–10.5)
nRBC: 0 % (ref 0.0–0.2)

## 2021-09-05 MED ORDER — SODIUM CHLORIDE 0.9% FLUSH
10.0000 mL | Freq: Once | INTRAVENOUS | Status: AC
  Administered 2021-09-05: 10 mL

## 2021-09-05 MED ORDER — HEPARIN SOD (PORK) LOCK FLUSH 100 UNIT/ML IV SOLN
500.0000 [IU] | Freq: Once | INTRAVENOUS | Status: AC
  Administered 2021-09-05: 500 [IU]

## 2021-09-10 ENCOUNTER — Encounter: Payer: Self-pay | Admitting: Radiation Oncology

## 2021-09-10 NOTE — Progress Notes (Incomplete)
  Radiation Oncology         [phone] ________________________________  Patient Name: Edward Beck MRN: [id number] DOB: 1945-07-13 Referring Physician: June Leap Date of Service: 08/02/2021 [facility],                                                         End Of Treatment Note  Diagnoses: C34.32-Malignant neoplasm of lower lobe, left bronchus or lung  Cancer Staging: The encounter diagnosis was Stage IV squamous cell carcinoma of left lung ([place]).   Stage IV (T3, N1, M1b) non-small cell lung cancer favoring squamous cell carcinoma presented with superior segment left lower lobe lung mass with invasion of the chest wall and destruction of the posterior left sixth and seventh ribs as well as left hilar adenopathy and suspicious pleural metastasis diagnosed in March 2023  Intent: Palliative  Radiation Treatment Dates: 07/22/2021 through 08/02/2021 Site Technique Total Dose (Gy) Dose per Fx (Gy) Completed Fx Beam Energies  Lung, Left: Lung_L 3D 30/30 3 10/10 6X   Narrative: The patient tolerated radiation therapy relatively well. During his final weekly treatment check on 07/30/21, the patient improvement in his left sided pain and ongoing shortness of breath with exertion.  Plan: The patient will follow-up with radiation oncology in one month .  ________________________________________________ -----------------------------------  Blair Promise, PhD, MD  This document serves as a record of services personally performed by Gery Pray, MD. It was created on his behalf by Roney Mans, a trained medical scribe. The creation of this record is based on the scribe's personal observations and the provider's statements to them. This document has been checked and approved by the attending provider.

## 2021-09-11 NOTE — Progress Notes (Signed)
Radiation Oncology         [phone] ________________________________  Name: Edward Beck MRN: [id number]  Date: 09/12/2021  DOB: 03/31/1946  Follow-Up Visit Note  CC: [name], PA-C  Icard, Octavio Graves, DO    ICD-10-CM   1. Stage IV squamous cell carcinoma of left lung ([facility])  C34.92       Diagnosis:  The encounter diagnosis was Stage IV squamous cell carcinoma of left lung ([place]).   Stage IV (T3, N1, M1b) non-small cell lung cancer favoring squamous cell carcinoma presented with superior segment left lower lobe lung mass with invasion of the chest wall and destruction of the posterior left sixth and seventh ribs as well as left hilar adenopathy and suspicious pleural metastasis diagnosed in March 2023  Interval Since Last Radiation: 1 month and 11 days   Intent: Palliative  Radiation Treatment Dates: 07/22/2021 through 08/02/2021 Site Technique Total Dose (Gy) Dose per Fx (Gy) Completed Fx Beam Energies  Lung, Left: Lung_L 3D 30/30 3 10/10 6X    Narrative:  The patient returns today for routine follow-up.  The patient tolerated radiation therapy relatively well. During his final weekly treatment check on 07/30/21, the patient reported improvement in his left sided pain and ongoing shortness of breath with exertion.      Since completing RT, the patient most recently followed up with Dr. Julien Nordmann on 08/28/21. During which time, the patient reported several episodes of diarrhea that lasted for around 4 days after his last cycle of carboplatin, paclitaxel, and Libtayo. He also reported intermittent left sided chest pain. He denied any other symptoms and proceeded with cycle 3 of chemotherapy that date.     Otherwise, no significant interval history since the patient was last seen.   On evaluation today the patient denies any significant breathing issues.  He denies any further pain along the chest or shoulder region.  He reports walking his dog 3-4 times per day.  He is  scheduled to have additional scanning next week and will see Dr. Julien Nordmann for review of the studies.  Allergies:  is allergic to tamiflu [oseltamivir phosphate].  Meds: Current Outpatient Medications  Medication Sig Dispense Refill   finasteride (PROSCAR) 5 MG tablet Take 1 tablet (5 mg total) by mouth daily. 90 tablet 3   lidocaine-prilocaine (EMLA) cream Apply to the Port-A-Cath site 30 minutes before chemotherapy. 30 g 0   naproxen sodium (ALEVE) 220 MG tablet Take 220 mg by mouth 2 (two) times daily as needed (pain).     triamcinolone cream (KENALOG) 0.1 % Apply 1 application. topically 2 (two) times daily as needed (dry skin).     prochlorperazine (COMPAZINE) 10 MG tablet Take 1 tablet (10 mg total) by mouth every 6 (six) hours as needed for nausea or vomiting. 30 tablet 0   umeclidinium-vilanterol (ANORO ELLIPTA) 62.5-25 MCG/ACT AEPB Inhale 1 puff into the lungs daily. (Patient not taking: Reported on 09/12/2021) 7 each 0   No current facility-administered medications for this encounter.    Physical Findings: The patient is in no acute distress. Patient is alert and oriented.  weight is 150 lb 6 oz (68.2 kg). His blood pressure is 116/67 and his pulse is 61. His respiration is 20 and oxygen saturation is 100%. .  No significant changes. Lungs are clear to auscultation bilaterally. Heart has regular rate and rhythm. No palpable cervical, supraclavicular, or axillary adenopathy. Abdomen soft, non-tender, normal bowel sounds.  The left upper back area shows significant hyperpigmentation  changes consistent with radiation treatment.  No skin breakdown.  Skin is well-healed.   Lab Findings: Lab Results  Component Value Date   WBC 5.7 09/05/2021   HGB 9.7 (L) 09/05/2021   HCT 28.6 (L) 09/05/2021   MCV 91.7 09/05/2021   PLT 117 (L) 09/05/2021    Radiographic Findings: No results found.  Impression:  The encounter diagnosis was Stage IV squamous cell carcinoma of left lung ([place]).    Stage IV (T3, N1, M1b) non-small cell lung cancer favoring squamous cell carcinoma presented with superior segment left lower lobe lung mass with invasion of the chest wall and destruction of the posterior left sixth and seventh ribs as well as left hilar adenopathy and suspicious pleural metastasis diagnosed in March 2023  The patient is recovering from the effects of radiation.  His pain in the chest/shoulder areas have improved significantly with his palliative radiation therapy.  Does report some swallowing difficulties which started during his chemotherapy.  He is able to eat solid foods but chews them carefully to avoid getting choked.  Plan: As needed follow-up in radiation oncology.  The patient will continue close follow-up in medical oncology and continue on systemic therapy as above.    ____________________________________  Blair Promise, PhD, MD  This document serves as a record of services personally performed by Gery Pray, MD. It was created on his behalf by Roney Mans, a trained medical scribe. The creation of this record is based on the scribe's personal observations and the provider's statements to them. This document has been checked and approved by the attending provider.

## 2021-09-11 NOTE — Progress Notes (Incomplete)
Edward Beck is here today for follow up post radiation to the lung.  Lung Side: Left, patient completed treatment on 08/02/21  Does the patient complain of any of the following: Pain:*** Shortness of breath w/wo exertion: *** Cough: *** Hemoptysis: *** Pain with swallowing: *** Swallowing/choking concerns: *** Appetite: *** Energy Level: *** Post radiation skin Changes: ***    Additional comments if applicable:

## 2021-09-12 ENCOUNTER — Other Ambulatory Visit: Payer: Self-pay | Admitting: Internal Medicine

## 2021-09-12 ENCOUNTER — Other Ambulatory Visit: Payer: Medicare Other

## 2021-09-12 ENCOUNTER — Other Ambulatory Visit: Payer: Self-pay

## 2021-09-12 ENCOUNTER — Telehealth: Payer: Self-pay

## 2021-09-12 ENCOUNTER — Ambulatory Visit
Admission: RE | Admit: 2021-09-12 | Discharge: 2021-09-12 | Disposition: A | Discharge status: Home / Self Care | Payer: Medicare Other | Source: Ambulatory Visit | Attending: Radiation Oncology | Admitting: Radiation Oncology

## 2021-09-12 ENCOUNTER — Encounter: Payer: Self-pay | Admitting: Radiation Oncology

## 2021-09-12 ENCOUNTER — Inpatient Hospital Stay: Payer: Medicare Other

## 2021-09-12 VITALS — BP 116/67 | HR 61 | Resp 20 | Wt 150.4 lb

## 2021-09-12 DIAGNOSIS — C3492 Malignant neoplasm of unspecified part of left bronchus or lung: Secondary | ICD-10-CM

## 2021-09-12 DIAGNOSIS — Z5111 Encounter for antineoplastic chemotherapy: Secondary | ICD-10-CM

## 2021-09-12 DIAGNOSIS — R1310 Dysphagia, unspecified: Secondary | ICD-10-CM | POA: Diagnosis not present

## 2021-09-12 DIAGNOSIS — Z79899 Other long term (current) drug therapy: Secondary | ICD-10-CM | POA: Diagnosis not present

## 2021-09-12 DIAGNOSIS — R5383 Other fatigue: Secondary | ICD-10-CM

## 2021-09-12 DIAGNOSIS — Z510 Encounter for antineoplastic radiation therapy: Secondary | ICD-10-CM | POA: Diagnosis not present

## 2021-09-12 DIAGNOSIS — Z95828 Presence of other vascular implants and grafts: Secondary | ICD-10-CM

## 2021-09-12 HISTORY — DX: Personal history of irradiation: Z92.3

## 2021-09-12 LAB — CBC WITH DIFFERENTIAL (CANCER CENTER ONLY)
Abs Immature Granulocytes: 0.19 10*3/uL — ABNORMAL HIGH (ref 0.00–0.07)
Basophils Absolute: 0.1 10*3/uL (ref 0.0–0.1)
Basophils Relative: 1 %
Eosinophils Absolute: 0 10*3/uL (ref 0.0–0.5)
Eosinophils Relative: 0 %
HCT: 31.2 % — ABNORMAL LOW (ref 39.0–52.0)
Hemoglobin: 10.4 g/dL — ABNORMAL LOW (ref 13.0–17.0)
Immature Granulocytes: 2 %
Lymphocytes Relative: 8 %
Lymphs Abs: 0.7 10*3/uL (ref 0.7–4.0)
MCH: 30.9 pg (ref 26.0–34.0)
MCHC: 33.3 g/dL (ref 30.0–36.0)
MCV: 92.6 fL (ref 80.0–100.0)
Monocytes Absolute: 0.7 10*3/uL (ref 0.1–1.0)
Monocytes Relative: 8 %
Neutro Abs: 7.6 10*3/uL (ref 1.7–7.7)
Neutrophils Relative %: 81 %
Platelet Count: 191 10*3/uL (ref 150–400)
RBC: 3.37 MIL/uL — ABNORMAL LOW (ref 4.22–5.81)
RDW: 18.3 % — ABNORMAL HIGH (ref 11.5–15.5)
WBC Count: 9.3 10*3/uL (ref 4.0–10.5)
nRBC: 0 % (ref 0.0–0.2)

## 2021-09-12 LAB — CMP (CANCER CENTER ONLY)
ALT: 10 U/L (ref 0–44)
AST: 18 U/L (ref 15–41)
Albumin: 3.5 g/dL (ref 3.5–5.0)
Alkaline Phosphatase: 98 U/L (ref 38–126)
Anion gap: 7 (ref 5–15)
BUN: 13 mg/dL (ref 8–23)
CO2: 30 mmol/L (ref 22–32)
Calcium: 9.1 mg/dL (ref 8.9–10.3)
Chloride: 103 mmol/L (ref 98–111)
Creatinine: 1.05 mg/dL (ref 0.61–1.24)
GFR, Estimated: >60 mL/min (ref >60)
Glucose, Bld: 106 mg/dL — ABNORMAL HIGH (ref 70–99)
Potassium: 2.9 mmol/L — ABNORMAL LOW (ref 3.5–5.1)
Sodium: 140 mmol/L (ref 135–145)
Total Bilirubin: 0.4 mg/dL (ref 0.3–1.2)
Total Protein: 6.4 g/dL — ABNORMAL LOW (ref 6.5–8.1)

## 2021-09-12 LAB — TSH: TSH: 1.35 u[IU]/mL (ref 0.350–4.500)

## 2021-09-12 MED ORDER — HEPARIN SOD (PORK) LOCK FLUSH 100 UNIT/ML IV SOLN
500.0000 [IU] | Freq: Once | INTRAVENOUS | Status: AC
  Administered 2021-09-12: 500 [IU]

## 2021-09-12 MED ORDER — SODIUM CHLORIDE 0.9% FLUSH
10.0000 mL | Freq: Once | INTRAVENOUS | Status: AC
  Administered 2021-09-12: 10 mL

## 2021-09-12 MED ORDER — POTASSIUM CHLORIDE CRYS ER 20 MEQ PO TBCR: 20.0000 meq | EXTENDED_RELEASE_TABLET | Freq: Two times a day (BID) | ORAL | 0 refills | Status: DC

## 2021-09-12 NOTE — Telephone Encounter (Signed)
-----   Message from Curt Bears, MD sent at 09/12/2021 10:12 AM EDT ----- His potassium is low.  Please let him know that I sent potassium chloride to his pharmacy in [place].  Thank you. ----- Message ----- From: Buel Ream, Lab [facility] [place] Sent: 09/12/2021   9:35 AM EDT To: Curt Bears, MD

## 2021-09-12 NOTE — Progress Notes (Signed)
Edward Beck is here today for follow up post radiation to the lung.  Lung Side: Left, patient completed treatment on 08/02/21  Does the patient complain of any of the following: Pain:no Shortness of breath w/wo exertion: sometime with activity Cough: no Hemoptysis: no Pain with swallowing: some  Swallowing/choking concerns: no Appetite: fair per patient Energy Level: fair Post radiation skin Changes: dryness of skin Vitals:   09/12/21 0837  BP: 116/67  Pulse: 61  Resp: 20  SpO2: 100%  Weight: 68.2 kg       Additional comments if applicable:

## 2021-09-12 NOTE — Telephone Encounter (Signed)
I left a detail message advising as indicated.

## 2021-09-12 NOTE — Telephone Encounter (Signed)
Duplicate open in error 

## 2021-09-12 NOTE — Telephone Encounter (Signed)
Duplicate-opened in error. 

## 2021-09-17 ENCOUNTER — Encounter (HOSPITAL_COMMUNITY): Payer: Self-pay

## 2021-09-17 ENCOUNTER — Ambulatory Visit (HOSPITAL_COMMUNITY)
Admission: RE | Admit: 2021-09-17 | Discharge: 2021-09-17 | Disposition: A | Discharge status: Home / Self Care | Payer: Medicare Other | Source: Ambulatory Visit | Attending: Internal Medicine | Admitting: Internal Medicine

## 2021-09-17 DIAGNOSIS — C3490 Malignant neoplasm of unspecified part of unspecified bronchus or lung: Secondary | ICD-10-CM | POA: Diagnosis not present

## 2021-09-17 DIAGNOSIS — N200 Calculus of kidney: Secondary | ICD-10-CM | POA: Diagnosis not present

## 2021-09-17 DIAGNOSIS — J439 Emphysema, unspecified: Secondary | ICD-10-CM | POA: Diagnosis not present

## 2021-09-17 MED ORDER — IOHEXOL 300 MG/ML  SOLN
100.0000 mL | Freq: Once | INTRAMUSCULAR | Status: AC | PRN
  Administered 2021-09-17: 100 mL via INTRAVENOUS

## 2021-09-17 MED ORDER — HEPARIN SOD (PORK) LOCK FLUSH 100 UNIT/ML IV SOLN
INTRAVENOUS | Status: AC
  Filled 2021-09-17: qty 5

## 2021-09-17 MED ORDER — HEPARIN SOD (PORK) LOCK FLUSH 100 UNIT/ML IV SOLN
500.0000 [IU] | Freq: Once | INTRAVENOUS | Status: AC
  Administered 2021-09-17: 500 [IU] via INTRAVENOUS

## 2021-09-17 MED ORDER — SODIUM CHLORIDE (PF) 0.9 % IJ SOLN
INTRAMUSCULAR | Status: AC
  Filled 2021-09-17: qty 50

## 2021-09-19 ENCOUNTER — Inpatient Hospital Stay (HOSPITAL_BASED_OUTPATIENT_CLINIC_OR_DEPARTMENT_OTHER): Payer: Medicare Other | Admitting: Internal Medicine

## 2021-09-19 ENCOUNTER — Inpatient Hospital Stay: Payer: Medicare Other

## 2021-09-19 ENCOUNTER — Encounter: Payer: Self-pay | Admitting: Internal Medicine

## 2021-09-19 ENCOUNTER — Other Ambulatory Visit: Payer: Self-pay

## 2021-09-19 ENCOUNTER — Other Ambulatory Visit: Payer: Medicare Other

## 2021-09-19 VITALS — BP 143/82 | HR 67 | Temp 97.9°F | Resp 17 | Wt 163.1 lb

## 2021-09-19 DIAGNOSIS — Z5112 Encounter for antineoplastic immunotherapy: Secondary | ICD-10-CM

## 2021-09-19 DIAGNOSIS — C3492 Malignant neoplasm of unspecified part of left bronchus or lung: Secondary | ICD-10-CM | POA: Diagnosis not present

## 2021-09-19 DIAGNOSIS — R1310 Dysphagia, unspecified: Secondary | ICD-10-CM | POA: Diagnosis not present

## 2021-09-19 DIAGNOSIS — Z5111 Encounter for antineoplastic chemotherapy: Secondary | ICD-10-CM | POA: Diagnosis not present

## 2021-09-19 DIAGNOSIS — Z95828 Presence of other vascular implants and grafts: Secondary | ICD-10-CM

## 2021-09-19 DIAGNOSIS — Z510 Encounter for antineoplastic radiation therapy: Secondary | ICD-10-CM | POA: Diagnosis not present

## 2021-09-19 DIAGNOSIS — Z79899 Other long term (current) drug therapy: Secondary | ICD-10-CM | POA: Diagnosis not present

## 2021-09-19 LAB — CBC WITH DIFFERENTIAL (CANCER CENTER ONLY)
Abs Immature Granulocytes: 0.04 10*3/uL (ref 0.00–0.07)
Basophils Absolute: 0 10*3/uL (ref 0.0–0.1)
Basophils Relative: 1 %
Eosinophils Absolute: 0.1 10*3/uL (ref 0.0–0.5)
Eosinophils Relative: 1 %
HCT: 30.5 % — ABNORMAL LOW (ref 39.0–52.0)
Hemoglobin: 10.1 g/dL — ABNORMAL LOW (ref 13.0–17.0)
Immature Granulocytes: 1 %
Lymphocytes Relative: 14 %
Lymphs Abs: 0.6 10*3/uL — ABNORMAL LOW (ref 0.7–4.0)
MCH: 31.5 pg (ref 26.0–34.0)
MCHC: 33.1 g/dL (ref 30.0–36.0)
MCV: 95 fL (ref 80.0–100.0)
Monocytes Absolute: 0.8 10*3/uL (ref 0.1–1.0)
Monocytes Relative: 18 %
Neutro Abs: 2.8 10*3/uL (ref 1.7–7.7)
Neutrophils Relative %: 65 %
Platelet Count: 176 10*3/uL (ref 150–400)
RBC: 3.21 MIL/uL — ABNORMAL LOW (ref 4.22–5.81)
RDW: 19.6 % — ABNORMAL HIGH (ref 11.5–15.5)
WBC Count: 4.4 10*3/uL (ref 4.0–10.5)
nRBC: 0 % (ref 0.0–0.2)

## 2021-09-19 LAB — CMP (CANCER CENTER ONLY)
ALT: 10 U/L (ref 0–44)
AST: 20 U/L (ref 15–41)
Albumin: 3.4 g/dL — ABNORMAL LOW (ref 3.5–5.0)
Alkaline Phosphatase: 89 U/L (ref 38–126)
Anion gap: 4 — ABNORMAL LOW (ref 5–15)
BUN: 12 mg/dL (ref 8–23)
CO2: 31 mmol/L (ref 22–32)
Calcium: 9.4 mg/dL (ref 8.9–10.3)
Chloride: 104 mmol/L (ref 98–111)
Creatinine: 0.94 mg/dL (ref 0.61–1.24)
GFR, Estimated: >60 mL/min (ref >60)
Glucose, Bld: 89 mg/dL (ref 70–99)
Potassium: 4 mmol/L (ref 3.5–5.1)
Sodium: 139 mmol/L (ref 135–145)
Total Bilirubin: 0.4 mg/dL (ref 0.3–1.2)
Total Protein: 6.6 g/dL (ref 6.5–8.1)

## 2021-09-19 MED ORDER — PALONOSETRON HCL INJECTION 0.25 MG/5ML
0.2500 mg | Freq: Once | INTRAVENOUS | Status: AC
  Administered 2021-09-19: 0.25 mg via INTRAVENOUS
  Filled 2021-09-19: qty 5

## 2021-09-19 MED ORDER — SODIUM CHLORIDE 0.9 % IV SOLN
150.0000 mg | Freq: Once | INTRAVENOUS | Status: AC
  Administered 2021-09-19: 150 mg via INTRAVENOUS
  Filled 2021-09-19: qty 150

## 2021-09-19 MED ORDER — SODIUM CHLORIDE 0.9 % IV SOLN
440.5000 mg | Freq: Once | INTRAVENOUS | Status: AC
  Administered 2021-09-19: 440 mg via INTRAVENOUS
  Filled 2021-09-19: qty 44

## 2021-09-19 MED ORDER — SODIUM CHLORIDE 0.9 % IV SOLN
350.0000 mg | Freq: Once | INTRAVENOUS | Status: AC
  Administered 2021-09-19: 350 mg via INTRAVENOUS
  Filled 2021-09-19: qty 7

## 2021-09-19 MED ORDER — FAMOTIDINE IN NACL 20-0.9 MG/50ML-% IV SOLN
20.0000 mg | Freq: Once | INTRAVENOUS | Status: AC
  Administered 2021-09-19: 20 mg via INTRAVENOUS
  Filled 2021-09-19: qty 50

## 2021-09-19 MED ORDER — SODIUM CHLORIDE 0.9% FLUSH
10.0000 mL | Freq: Once | INTRAVENOUS | Status: AC
  Administered 2021-09-19: 10 mL

## 2021-09-19 MED ORDER — SODIUM CHLORIDE 0.9 % IV SOLN
10.0000 mg | Freq: Once | INTRAVENOUS | Status: AC
  Administered 2021-09-19: 10 mg via INTRAVENOUS
  Filled 2021-09-19: qty 10

## 2021-09-19 MED ORDER — HEPARIN SOD (PORK) LOCK FLUSH 100 UNIT/ML IV SOLN
500.0000 [IU] | Freq: Once | INTRAVENOUS | Status: AC | PRN
  Administered 2021-09-19: 500 [IU]

## 2021-09-19 MED ORDER — SODIUM CHLORIDE 0.9 % IV SOLN
175.0000 mg/m2 | Freq: Once | INTRAVENOUS | Status: AC
  Administered 2021-09-19: 354 mg via INTRAVENOUS
  Filled 2021-09-19: qty 59

## 2021-09-19 MED ORDER — DIPHENHYDRAMINE HCL 50 MG/ML IJ SOLN
50.0000 mg | Freq: Once | INTRAMUSCULAR | Status: AC
  Administered 2021-09-19: 50 mg via INTRAVENOUS
  Filled 2021-09-19: qty 1

## 2021-09-19 MED ORDER — SODIUM CHLORIDE 0.9% FLUSH
10.0000 mL | INTRAVENOUS | Status: DC | PRN
  Administered 2021-09-19: 10 mL

## 2021-09-19 MED ORDER — SODIUM CHLORIDE 0.9 % IV SOLN: Freq: Once | INTRAVENOUS | Status: AC

## 2021-09-19 NOTE — Patient Instructions (Signed)
[facility] Discharge Instructions for Patients Receiving Chemotherapy  Today you received the following chemotherapy agents taxol/carbo  To help prevent nausea and vomiting after your treatment, we encourage you to take your nausea medication as directed.     If you develop nausea and vomiting that is not controlled by your nausea medication, call the clinic.   BELOW ARE SYMPTOMS THAT SHOULD BE REPORTED IMMEDIATELY: *FEVER GREATER THAN 100.5 F *CHILLS WITH OR WITHOUT FEVER NAUSEA AND VOMITING THAT IS NOT CONTROLLED WITH YOUR NAUSEA MEDICATION *UNUSUAL SHORTNESS OF BREATH *UNUSUAL BRUISING OR BLEEDING TENDERNESS IN MOUTH AND THROAT WITH OR WITHOUT PRESENCE OF ULCERS *URINARY PROBLEMS *BOWEL PROBLEMS UNUSUAL RASH Items with * indicate a potential emergency and should be followed up as soon as possible.  Feel free to call the clinic you have any questions or concerns. The clinic phone number is [phone].

## 2021-09-19 NOTE — Progress Notes (Signed)
[facility] [phone]   [phone]  OFFICE PROGRESS NOTE  [name], PA-C [address] [place] [address]  DIAGNOSIS: Stage IV (T3, N1, M1b) non-small cell lung cancer favoring squamous cell carcinoma presented with superior segment left lower lobe lung mass with invasion of the chest wall and destruction of the posterior left sixth and seventh ribs as well as left hilar adenopathy and suspicious pleural metastasis diagnosed in March 2023  PRIOR THERAPY: Palliative radiotherapy to the left lower lobe lung mass with chest wall invasion under the care of Dr. Sondra Come  CURRENT THERAPY: Palliative systemic chemotherapy with carboplatin for AUC of 5, paclitaxel 175 Mg/M2 and Libtayo (Cempilimab) 350 Mg IV every 3 weeks with Neulasta support.  First dose was given on July 18, 2021.  Status post 3 cycles  INTERVAL HISTORY: Edward Beck 76 y.o. male returns to the clinic today for follow-up visit accompanied by his wife.  The patient is feeling fine today with no concerning complaints.  He denied having any chest pain, shortness of breath except with exertion with no cough or hemoptysis.  He has no nausea, vomiting, diarrhea or constipation.  He has no headache or visual changes.  He has no recent weight loss or night sweats.  He has been tolerating his treatment fairly well.  The patient had repeat CT scan of the chest, abdomen pelvis performed recently and he is here for evaluation and discussion of his scan results.  MEDICAL HISTORY: Past Medical History:  Diagnosis Date   Condyloma acuminatum of scrotum    COVID 2021   December 2022 - mild   History of radiation therapy    Left Lung- 07/23/21-08/02/21- Dr. Gery Pray   Lung cancer [facility])    Pneumonia    Scrotal lesion     ALLERGIES:  is allergic to tamiflu [oseltamivir phosphate].  MEDICATIONS:  Current Outpatient Medications  Medication Sig Dispense Refill   finasteride  (PROSCAR) 5 MG tablet Take 1 tablet (5 mg total) by mouth daily. 90 tablet 3   lidocaine-prilocaine (EMLA) cream Apply to the Port-A-Cath site 30 minutes before chemotherapy. 30 g 0   naproxen sodium (ALEVE) 220 MG tablet Take 220 mg by mouth 2 (two) times daily as needed (pain).     potassium chloride SA (KLOR-CON M) 20 MEQ tablet Take 1 tablet (20 mEq total) by mouth 2 (two) times daily. 14 tablet 0   prochlorperazine (COMPAZINE) 10 MG tablet Take 1 tablet (10 mg total) by mouth every 6 (six) hours as needed for nausea or vomiting. 30 tablet 0   triamcinolone cream (KENALOG) 0.1 % Apply 1 application. topically 2 (two) times daily as needed (dry skin).     umeclidinium-vilanterol (ANORO ELLIPTA) 62.5-25 MCG/ACT AEPB Inhale 1 puff into the lungs daily. (Patient not taking: Reported on 09/12/2021) 7 each 0   No current facility-administered medications for this visit.    SURGICAL HISTORY:  Past Surgical History:  Procedure Laterality Date   BRONCHIAL BIOPSY  07/02/2021   Procedure: BRONCHIAL BIOPSIES;  Surgeon: Garner Nash, DO;  Location: [facility];  Service: Pulmonary;;   BRONCHIAL BRUSHINGS  07/02/2021   Procedure: BRONCHIAL BRUSHINGS;  Surgeon: Garner Nash, DO;  Location: [facility];  Service: Pulmonary;;   BRONCHIAL NEEDLE ASPIRATION BIOPSY  07/02/2021   Procedure: BRONCHIAL NEEDLE ASPIRATION BIOPSIES;  Surgeon: Garner Nash, DO;  Location: [facility];  Service: Pulmonary;;   HERNIA REPAIR  2009  left inguinal   IR IMAGING GUIDED PORT INSERTION  07/16/2021   SCROTAL EXPLORATION  11/21/2011   Procedure: SCROTUM EXPLORATION;  Surgeon: Hanley Ben, MD;  Location: [facility];  Service: Urology;  Laterality: N/A;  EXCISION, BIOPSY SCROTAL CONDYLOMA    VIDEO BRONCHOSCOPY WITH ENDOBRONCHIAL ULTRASOUND Bilateral 07/02/2021   Procedure: VIDEO BRONCHOSCOPY WITH ENDOBRONCHIAL ULTRASOUND;  Surgeon: Garner Nash, DO;  Location: [facility];  Service: Pulmonary;  Laterality:  Bilateral;    REVIEW OF SYSTEMS:  Constitutional: positive for fatigue Eyes: negative Ears, nose, mouth, throat, and face: negative Respiratory: negative Cardiovascular: negative Gastrointestinal: negative Genitourinary:negative Integument/breast: negative Hematologic/lymphatic: negative Musculoskeletal:negative Neurological: negative Behavioral/Psych: negative Endocrine: negative Allergic/Immunologic: negative   PHYSICAL EXAMINATION: General appearance: alert, cooperative, fatigued, and no distress Head: Normocephalic, without obvious abnormality, atraumatic Neck: no adenopathy, no JVD, supple, symmetrical, trachea midline, and thyroid not enlarged, symmetric, no tenderness/mass/nodules Lymph nodes: Cervical, supraclavicular, and axillary nodes normal. Resp: clear to auscultation bilaterally Back: symmetric, no curvature. ROM normal. No CVA tenderness. Cardio: regular rate and rhythm, S1, S2 normal, no murmur, click, rub or gallop GI: soft, non-tender; bowel sounds normal; no masses,  no organomegaly Extremities: extremities normal, atraumatic, no cyanosis or edema Neurologic: Alert and oriented X 3, normal strength and tone. Normal symmetric reflexes. Normal coordination and gait  ECOG PERFORMANCE STATUS: 1 - Symptomatic but completely ambulatory  Blood pressure (!) 143/82, pulse 67, temperature 97.9 F (36.6 C), temperature source Tympanic, resp. rate 17, weight 163 lb 1 oz (74 kg), SpO2 98 %.  LABORATORY DATA: Lab Results  Component Value Date   WBC 4.4 09/19/2021   HGB 10.1 (L) 09/19/2021   HCT 30.5 (L) 09/19/2021   MCV 95.0 09/19/2021   PLT 176 09/19/2021      Chemistry      Component Value Date/Time   NA 139 09/19/2021 1044   K 4.0 09/19/2021 1044   CL 104 09/19/2021 1044   CO2 31 09/19/2021 1044   BUN 12 09/19/2021 1044   CREATININE 0.94 09/19/2021 1044      Component Value Date/Time   CALCIUM 9.4 09/19/2021 1044   ALKPHOS 89 09/19/2021 1044   AST 20  09/19/2021 1044   ALT 10 09/19/2021 1044   BILITOT 0.4 09/19/2021 1044       RADIOGRAPHIC STUDIES: CT Abdomen Pelvis W Contrast  Result Date: 09/18/2021 CLINICAL DATA:  Metastatic lung cancer restaging, assess treatment response * Tracking Code: BO * EXAM: CT CHEST, ABDOMEN, AND PELVIS WITH CONTRAST TECHNIQUE: Multidetector CT imaging of the chest, abdomen and pelvis was performed following the standard protocol during bolus administration of intravenous contrast. RADIATION DOSE REDUCTION: This exam was performed according to the departmental dose-optimization program which includes automated exposure control, adjustment of the mA and/or kV according to patient size and/or use of iterative reconstruction technique. CONTRAST:  182mL OMNIPAQUE IOHEXOL 300 MG/ML SOLN, additional oral enteric contrast COMPARISON:  CT chest, 07/03/2021, PET-CT, 06/07/2021 FINDINGS: CT CHEST FINDINGS Cardiovascular: Right chest port catheter. Aortic atherosclerosis. Normal heart size. Scattered left coronary artery calcifications. No pericardial effusion. Mediastinum/Nodes: Previously seen FDG avid left hilar lymphadenopathy is resolved (series 2, image 35). Thyroid gland, trachea, and esophagus demonstrate no significant findings. Lungs/Pleura: Moderate centrilobular and paraseptal emphysema. Diffuse bilateral bronchial wall thickening. Significant interval decrease in size of a spiculated mass of the paramedian superior segment right lower lobe, measuring 3.5 x 2.1 cm, previously 4.6 x 3.5 cm when measured similarly (series 4, image 63). Trace residual left pleural effusion, improved compared to prior  examination. Improved atelectasis or consolidation of the peripheral left upper lobe as well as of the dependent left lower lobe (series 4, image 79, 115). Musculoskeletal: Slight interval healing of lytic lesions of the posterior left sixth and seventh ribs underlying mass. CT ABDOMEN PELVIS FINDINGS Hepatobiliary: No solid  liver abnormality is seen. No gallstones, gallbladder wall thickening, or biliary dilatation. Pancreas: Unremarkable. No pancreatic ductal dilatation or surrounding inflammatory changes. Spleen: Normal in size without significant abnormality. Adrenals/Urinary Tract: Adrenal glands are unremarkable. Small nonobstructive calculus of the inferior pole of the right kidney. Simple, benign small right renal cortical cysts, for which no further follow-up or characterization is required. Left kidney is normal. No calculi or hydronephrosis. Bladder is unremarkable. Stomach/Bowel: Stomach is within normal limits. Appendix appears normal. No evidence of bowel wall thickening, distention, or inflammatory changes. Descending and sigmoid diverticulosis. Vascular/Lymphatic: Aortic atherosclerosis. No enlarged abdominal or pelvic lymph nodes. Reproductive: Prostatomegaly. Other: No abdominal wall hernia or abnormality. No ascites. Musculoskeletal: No acute osseous findings. IMPRESSION: 1. Significant interval decrease in size of a spiculated mass of the paramedian superior segment right lower lobe. 2. Trace residual left pleural effusion, improved compared to prior examination. Improved atelectasis or consolidation of the peripheral left upper lobe as well as of the dependent left lower lobe. 3. Previously seen FDG avid left hilar lymphadenopathy is resolved. 4. Slight interval healing of invasive lytic lesions of the posterior left sixth and seventh ribs underlying mass. 5. Constellation of findings is consistent with treatment response of primary lung malignancy, pleural metastatic disease, and hilar nodal metastatic disease. 6. No evidence of new metastatic disease in the abdomen or pelvis. 7. Prostatomegaly. 8. Emphysema and diffuse bilateral bronchial wall thickening. 9. Coronary artery disease. Aortic Atherosclerosis (ICD10-I70.0) and Emphysema (ICD10-J43.9). Electronically Signed   By: Delanna Ahmadi M.D.   On: 09/18/2021  10:28   CT Chest W Contrast  Result Date: 09/18/2021 CLINICAL DATA:  Metastatic lung cancer restaging, assess treatment response * Tracking Code: BO * EXAM: CT CHEST, ABDOMEN, AND PELVIS WITH CONTRAST TECHNIQUE: Multidetector CT imaging of the chest, abdomen and pelvis was performed following the standard protocol during bolus administration of intravenous contrast. RADIATION DOSE REDUCTION: This exam was performed according to the departmental dose-optimization program which includes automated exposure control, adjustment of the mA and/or kV according to patient size and/or use of iterative reconstruction technique. CONTRAST:  17mL OMNIPAQUE IOHEXOL 300 MG/ML SOLN, additional oral enteric contrast COMPARISON:  CT chest, 07/03/2021, PET-CT, 06/07/2021 FINDINGS: CT CHEST FINDINGS Cardiovascular: Right chest port catheter. Aortic atherosclerosis. Normal heart size. Scattered left coronary artery calcifications. No pericardial effusion. Mediastinum/Nodes: Previously seen FDG avid left hilar lymphadenopathy is resolved (series 2, image 35). Thyroid gland, trachea, and esophagus demonstrate no significant findings. Lungs/Pleura: Moderate centrilobular and paraseptal emphysema. Diffuse bilateral bronchial wall thickening. Significant interval decrease in size of a spiculated mass of the paramedian superior segment right lower lobe, measuring 3.5 x 2.1 cm, previously 4.6 x 3.5 cm when measured similarly (series 4, image 63). Trace residual left pleural effusion, improved compared to prior examination. Improved atelectasis or consolidation of the peripheral left upper lobe as well as of the dependent left lower lobe (series 4, image 79, 115). Musculoskeletal: Slight interval healing of lytic lesions of the posterior left sixth and seventh ribs underlying mass. CT ABDOMEN PELVIS FINDINGS Hepatobiliary: No solid liver abnormality is seen. No gallstones, gallbladder wall thickening, or biliary dilatation. Pancreas:  Unremarkable. No pancreatic ductal dilatation or surrounding inflammatory changes. Spleen: Normal in size  without significant abnormality. Adrenals/Urinary Tract: Adrenal glands are unremarkable. Small nonobstructive calculus of the inferior pole of the right kidney. Simple, benign small right renal cortical cysts, for which no further follow-up or characterization is required. Left kidney is normal. No calculi or hydronephrosis. Bladder is unremarkable. Stomach/Bowel: Stomach is within normal limits. Appendix appears normal. No evidence of bowel wall thickening, distention, or inflammatory changes. Descending and sigmoid diverticulosis. Vascular/Lymphatic: Aortic atherosclerosis. No enlarged abdominal or pelvic lymph nodes. Reproductive: Prostatomegaly. Other: No abdominal wall hernia or abnormality. No ascites. Musculoskeletal: No acute osseous findings. IMPRESSION: 1. Significant interval decrease in size of a spiculated mass of the paramedian superior segment right lower lobe. 2. Trace residual left pleural effusion, improved compared to prior examination. Improved atelectasis or consolidation of the peripheral left upper lobe as well as of the dependent left lower lobe. 3. Previously seen FDG avid left hilar lymphadenopathy is resolved. 4. Slight interval healing of invasive lytic lesions of the posterior left sixth and seventh ribs underlying mass. 5. Constellation of findings is consistent with treatment response of primary lung malignancy, pleural metastatic disease, and hilar nodal metastatic disease. 6. No evidence of new metastatic disease in the abdomen or pelvis. 7. Prostatomegaly. 8. Emphysema and diffuse bilateral bronchial wall thickening. 9. Coronary artery disease. Aortic Atherosclerosis (ICD10-I70.0) and Emphysema (ICD10-J43.9). Electronically Signed   By: Delanna Ahmadi M.D.   On: 09/18/2021 10:28    ASSESSMENT AND PLAN: This is a very pleasant 76 years old white male with Stage IV (T3, N1, M1b)  non-small cell lung cancer favoring squamous cell carcinoma presented with superior segment left lower lobe lung mass with invasion of the chest wall and destruction of the posterior left sixth and seventh ribs as well as left hilar adenopathy and suspicious pleural metastasis diagnosed in March 2023 He is currently undergoing palliative radiotherapy to the left lower lobe lung mass with chest wall invasion under the care of Dr. Sondra Come. He is also undergoing systemic chemotherapy with carboplatin for AUC of 5, paclitaxel 175 Mg/M2 and Libtayo (Cempilimab) 350 Mg IV every 3 weeks.  He started the first dose of his treatment on July 18, 2021.  Status post 3 cycles. The patient has been tolerating this treatment well with no concerning adverse effect except for mild fatigue. He had repeat CT scan of the chest, abdomen and pelvis performed recently.  I personally and independently reviewed the scan and discussed the results with the patient and his wife. His scan showed significant interval decrease in the size of the spiculated mass of the paramedian superior segment right lower lobe with no other evidence of progressive metastatic disease. I recommended for the patient to continue his current treatment and he will proceed with cycle #4 today. I will see him back for follow-up visit in 3 weeks for evaluation before starting the first cycle of maintenance therapy with single agent Libtayo (Cempilimab). The patient was advised to call immediately if he has any other concerning symptoms in the interval. The patient voices understanding of current disease status and treatment options and is in agreement with the current care plan.  All questions were answered. The patient knows to call the clinic with any problems, questions or concerns. We can certainly see the patient much sooner if necessary.  The total time spent in the appointment was 35 minutes.  Disclaimer: This note was dictated with voice  recognition software. Similar sounding words can inadvertently be transcribed and may not be corrected upon review.

## 2021-09-19 NOTE — Patient Instructions (Signed)
Steps to Quit Smoking Smoking tobacco is the leading cause of preventable death. It can affect almost every organ in the body. Smoking puts you and people around you at risk for many serious, long-lasting (chronic) diseases. Quitting smoking can be hard, but it is one of the best things that you can do for your health. It is never too late to quit. Do not give up if you cannot quit the first time. Some people need to try many times to quit. Do your best to stick to your quit plan, and talk with your doctor if you have any questions or concerns. How do I get ready to quit? Pick a date to quit. Set a date within the next 2 weeks to give you time to prepare. Write down the reasons why you are quitting. Keep this list in places where you will see it often. Tell your family, friends, and co-workers that you are quitting. Their support is important. Talk with your doctor about the choices that may help you quit. Find out if your health insurance will pay for these treatments. Know the people, places, things, and activities that make you want to smoke (triggers). Avoid them. What first steps can I take to quit smoking? Throw away all cigarettes at home, at work, and in your car. Throw away the things that you use when you smoke, such as ashtrays and lighters. Clean your car. Empty the ashtray. Clean your home, including curtains and carpets. What can I do to help me quit smoking? Talk with your doctor about taking medicines and seeing a counselor. You are more likely to succeed when you do both. If you are pregnant or breastfeeding: Talk with your doctor about counseling or other ways to quit smoking. Do not take medicine to help you quit smoking unless your doctor tells you to. Quit right away Quit smoking completely, instead of slowly cutting back on how much you smoke over a period of time. Stopping smoking right away may be more successful than slowly quitting. Go to counseling. In-person is best  if this is an option. You are more likely to quit if you go to counseling sessions regularly. Take medicine You may take medicines to help you quit. Some medicines need a prescription, and some you can buy over-the-counter. Some medicines may contain a drug called nicotine to replace the nicotine in cigarettes. Medicines may: Help you stop having the desire to smoke (cravings). Help to stop the problems that come when you stop smoking (withdrawal symptoms). Your doctor may ask you to use: Nicotine patches, gum, or lozenges. Nicotine inhalers or sprays. Non-nicotine medicine that you take by mouth. Find resources Find resources and other ways to help you quit smoking and remain smoke-free after you quit. They include: Online chats with a counselor. Phone quitlines. Printed self-help materials. Support groups or group counseling. Text messaging programs. Mobile phone apps. Use apps on your mobile phone or tablet that can help you stick to your quit plan. Examples of free services include Quit Guide from the CDC and smokefree.gov  What can I do to make it easier to quit?  Talk to your family and friends. Ask them to support and encourage you. Call a phone quitline, such as 1-800-QUIT-NOW, reach out to support groups, or work with a counselor. Ask people who smoke to not smoke around you. Avoid places that make you want to smoke, such as: Bars. Parties. Smoke-break areas at work. Spend time with people who do not smoke. Lower   the stress in your life. Stress can make you want to smoke. Try these things to lower stress: Getting regular exercise. Doing deep-breathing exercises. Doing yoga. Meditating. What benefits will I see if I quit smoking? Over time, you may have: A better sense of smell and taste. Less coughing and sore throat. A slower heart rate. Lower blood pressure. Clearer skin. Better breathing. Fewer sick days. Summary Quitting smoking can be hard, but it is one of  the best things that you can do for your health. Do not give up if you cannot quit the first time. Some people need to try many times to quit. When you decide to quit smoking, make a plan to help you succeed. Quit smoking right away, not slowly over a period of time. When you start quitting, get help and support to keep you smoke-free. This information is not intended to replace advice given to you by your health care provider. Make sure you discuss any questions you have with your health care provider. Document Revised: 03/15/2021 Document Reviewed: 03/15/2021 Elsevier Patient Education  2023 [employer]  

## 2021-09-21 ENCOUNTER — Other Ambulatory Visit: Payer: Self-pay

## 2021-09-21 ENCOUNTER — Inpatient Hospital Stay: Payer: Medicare Other

## 2021-09-21 VITALS — BP 107/69 | HR 84 | Temp 97.9°F

## 2021-09-21 DIAGNOSIS — C3492 Malignant neoplasm of unspecified part of left bronchus or lung: Secondary | ICD-10-CM

## 2021-09-21 DIAGNOSIS — R1310 Dysphagia, unspecified: Secondary | ICD-10-CM | POA: Diagnosis not present

## 2021-09-21 DIAGNOSIS — Z79899 Other long term (current) drug therapy: Secondary | ICD-10-CM | POA: Diagnosis not present

## 2021-09-21 DIAGNOSIS — Z510 Encounter for antineoplastic radiation therapy: Secondary | ICD-10-CM | POA: Diagnosis not present

## 2021-09-21 MED ORDER — PEGFILGRASTIM-CBQV 6 MG/0.6ML ~~LOC~~ SOSY
6.0000 mg | PREFILLED_SYRINGE | Freq: Once | SUBCUTANEOUS | Status: AC
  Administered 2021-09-21: 6 mg via SUBCUTANEOUS

## 2021-09-24 ENCOUNTER — Ambulatory Visit (INDEPENDENT_AMBULATORY_CARE_PROVIDER_SITE_OTHER): Payer: Medicare Other | Admitting: Urology

## 2021-09-24 ENCOUNTER — Encounter: Payer: Self-pay | Admitting: Urology

## 2021-09-24 VITALS — BP 104/63 | HR 81

## 2021-09-24 DIAGNOSIS — R351 Nocturia: Secondary | ICD-10-CM | POA: Diagnosis not present

## 2021-09-24 DIAGNOSIS — N401 Enlarged prostate with lower urinary tract symptoms: Secondary | ICD-10-CM | POA: Diagnosis not present

## 2021-09-24 LAB — URINALYSIS, ROUTINE W REFLEX MICROSCOPIC
Bilirubin, UA: NEGATIVE
Glucose, UA: NEGATIVE
Ketones, UA: NEGATIVE
Leukocytes,UA: NEGATIVE
Nitrite, UA: NEGATIVE
Specific Gravity, UA: 1.015 (ref 1.005–1.030)
Urobilinogen, Ur: 0.2 mg/dL (ref 0.2–1.0)
pH, UA: 7 (ref 5.0–7.5)

## 2021-09-24 LAB — MICROSCOPIC EXAMINATION

## 2021-09-24 NOTE — Patient Instructions (Signed)

## 2021-09-24 NOTE — Progress Notes (Signed)
09/24/2021 8:49 AM   Edward Beck December 06, 1945 [id number]  Referring provider: [name], PA-C [place],  [place] [address]  nocturia  HPI: Edward Beck is a 76yo here for followup for BPH and nocturia. IPSS 9 QOL 2 on finasteride. Urine stream fair. He has occasional urinary hesitancy. No straining to urinate. Nocturia is stable at 3x on finasteride. He is currently on chemotherapy and radiation for Stage IV lung cancer.    PMH: Past Medical History:  Diagnosis Date   Condyloma acuminatum of scrotum    COVID 2021   December 2022 - mild   History of radiation therapy    Left Lung- 07/23/21-08/02/21- Dr. Gery Pray   Lung cancer [facility])    Pneumonia    Scrotal lesion     Surgical History: Past Surgical History:  Procedure Laterality Date   BRONCHIAL BIOPSY  07/02/2021   Procedure: BRONCHIAL BIOPSIES;  Surgeon: Garner Nash, DO;  Location: [place];  Service: Pulmonary;;   BRONCHIAL BRUSHINGS  07/02/2021   Procedure: BRONCHIAL BRUSHINGS;  Surgeon: Garner Nash, DO;  Location: [place] [facility];  Service: Pulmonary;;   BRONCHIAL NEEDLE ASPIRATION BIOPSY  07/02/2021   Procedure: BRONCHIAL NEEDLE ASPIRATION BIOPSIES;  Surgeon: Garner Nash, DO;  Location: [facility];  Service: Pulmonary;;   HERNIA REPAIR  2009   left inguinal   IR IMAGING GUIDED PORT INSERTION  07/16/2021   SCROTAL EXPLORATION  11/21/2011   Procedure: SCROTUM EXPLORATION;  Surgeon: Hanley Ben, MD;  Location: [facility];  Service: Urology;  Laterality: N/A;  EXCISION, BIOPSY SCROTAL CONDYLOMA    VIDEO BRONCHOSCOPY WITH ENDOBRONCHIAL ULTRASOUND Bilateral 07/02/2021   Procedure: VIDEO BRONCHOSCOPY WITH ENDOBRONCHIAL ULTRASOUND;  Surgeon: Garner Nash, DO;  Location: [place];  Service: Pulmonary;  Laterality: Bilateral;    Home Medications:  Allergies as of 09/24/2021       Reactions   Tamiflu [oseltamivir Phosphate] Rash        Medication List        Accurate  as of September 24, 2021  8:49 AM. If you have any questions, ask your nurse or doctor.          Anoro Ellipta 62.5-25 MCG/ACT Aepb Generic drug: umeclidinium-vilanterol Inhale 1 puff into the lungs daily.   finasteride 5 MG tablet Commonly known as: PROSCAR Take 1 tablet (5 mg total) by mouth daily.   lidocaine-prilocaine cream Commonly known as: EMLA Apply to the Port-A-Cath site 30 minutes before chemotherapy.   potassium chloride SA 20 MEQ tablet Commonly known as: KLOR-CON M Take 1 tablet (20 mEq total) by mouth 2 (two) times daily.   prochlorperazine 10 MG tablet Commonly known as: COMPAZINE Take 1 tablet (10 mg total) by mouth every 6 (six) hours as needed for nausea or vomiting.   triamcinolone cream 0.1 % Commonly known as: KENALOG Apply 1 application. topically 2 (two) times daily as needed (dry skin).        Allergies:  Allergies  Allergen Reactions   Tamiflu [Oseltamivir Phosphate] Rash    Family History: No family history on file.  Social History:  reports that he has been smoking cigarettes. He has a 50.00 pack-year smoking history. He does not have any smokeless tobacco history on file. He reports current alcohol use of about 1.0 standard drink of alcohol per week. He reports that he does not use drugs.  ROS: All other review of systems were reviewed and are negative except what is noted above in HPI  Physical Exam: BP 104/63   Pulse 81   Constitutional:  Alert and oriented, No acute distress. HEENT: [place] AT, moist mucus membranes.  Trachea midline, no masses. Cardiovascular: No clubbing, cyanosis, or edema. Respiratory: Normal respiratory effort, no increased work of breathing. GI: Abdomen is soft, nontender, nondistended, no abdominal masses GU: No CVA tenderness.  Lymph: No cervical or inguinal lymphadenopathy. Skin: No rashes, bruises or suspicious lesions. Neurologic: Grossly intact, no focal deficits, moving all 4 extremities. Psychiatric:  Normal mood and affect.  Laboratory Data: Lab Results  Component Value Date   WBC 4.4 09/19/2021   HGB 10.1 (L) 09/19/2021   HCT 30.5 (L) 09/19/2021   MCV 95.0 09/19/2021   PLT 176 09/19/2021    Lab Results  Component Value Date   CREATININE 0.94 09/19/2021    No results found for: "PSA"  No results found for: "TESTOSTERONE"  No results found for: "HGBA1C"  Urinalysis    Component Value Date/Time   APPEARANCEUR Clear 03/08/2021 1214   GLUCOSEU Negative 03/08/2021 1214   BILIRUBINUR Negative 03/08/2021 1214   PROTEINUR Negative 03/08/2021 1214   NITRITE Negative 03/08/2021 1214   LEUKOCYTESUR Negative 03/08/2021 1214    Lab Results  Component Value Date   LABMICR See below: 03/08/2021   WBCUA None seen 03/08/2021   LABEPIT None seen 03/08/2021   MUCUS Present 03/07/2020   BACTERIA None seen 03/08/2021    Pertinent Imaging:  No results found for this or any previous visit.  No results found for this or any previous visit.  No results found for this or any previous visit.  No results found for this or any previous visit.  No results found for this or any previous visit.  No results found for this or any previous visit.  No results found for this or any previous visit.  No results found for this or any previous visit.   Assessment & Plan:    1. Benign localized prostatic hyperplasia with lower urinary tract symptoms (LUTS) -Continue finasteride 5mg  daily - Urinalysis, Routine w reflex microscopic  2. Nocturia -continue finasteride 5mg  daily  3. Prostate nodule -PSA today, if stable RTC 6 months with PSA   No follow-ups on file.  Nicolette Bang, MD  [facility] [place]

## 2021-09-25 ENCOUNTER — Encounter: Payer: Self-pay | Admitting: *Deleted

## 2021-09-25 LAB — PSA: Prostate Specific Ag, Serum: 1.1 ng/mL (ref 0.0–4.0)

## 2021-09-25 NOTE — Progress Notes (Signed)
Patient is scheduled for his plan of care at this time.

## 2021-10-10 ENCOUNTER — Inpatient Hospital Stay: Payer: Medicare Other | Attending: Internal Medicine

## 2021-10-10 ENCOUNTER — Inpatient Hospital Stay: Payer: Medicare Other

## 2021-10-10 ENCOUNTER — Inpatient Hospital Stay (HOSPITAL_BASED_OUTPATIENT_CLINIC_OR_DEPARTMENT_OTHER): Payer: Medicare Other | Admitting: Internal Medicine

## 2021-10-10 ENCOUNTER — Other Ambulatory Visit: Payer: Self-pay

## 2021-10-10 VITALS — BP 142/74 | HR 57 | Temp 97.7°F | Resp 16

## 2021-10-10 VITALS — BP 133/83 | HR 55 | Temp 97.6°F | Resp 18 | Wt 153.0 lb

## 2021-10-10 DIAGNOSIS — C3432 Malignant neoplasm of lower lobe, left bronchus or lung: Secondary | ICD-10-CM | POA: Insufficient documentation

## 2021-10-10 DIAGNOSIS — C7989 Secondary malignant neoplasm of other specified sites: Secondary | ICD-10-CM | POA: Diagnosis not present

## 2021-10-10 DIAGNOSIS — C7951 Secondary malignant neoplasm of bone: Secondary | ICD-10-CM | POA: Diagnosis not present

## 2021-10-10 DIAGNOSIS — Z5111 Encounter for antineoplastic chemotherapy: Secondary | ICD-10-CM

## 2021-10-10 DIAGNOSIS — C3492 Malignant neoplasm of unspecified part of left bronchus or lung: Secondary | ICD-10-CM

## 2021-10-10 DIAGNOSIS — Z95828 Presence of other vascular implants and grafts: Secondary | ICD-10-CM

## 2021-10-10 DIAGNOSIS — Z5112 Encounter for antineoplastic immunotherapy: Secondary | ICD-10-CM | POA: Insufficient documentation

## 2021-10-10 DIAGNOSIS — Z79899 Other long term (current) drug therapy: Secondary | ICD-10-CM | POA: Diagnosis not present

## 2021-10-10 DIAGNOSIS — R5383 Other fatigue: Secondary | ICD-10-CM

## 2021-10-10 LAB — CBC WITH DIFFERENTIAL (CANCER CENTER ONLY)
Abs Immature Granulocytes: 0.03 10*3/uL (ref 0.00–0.07)
Basophils Absolute: 0 10*3/uL (ref 0.0–0.1)
Basophils Relative: 1 %
Eosinophils Absolute: 0 10*3/uL (ref 0.0–0.5)
Eosinophils Relative: 1 %
HCT: 27.9 % — ABNORMAL LOW (ref 39.0–52.0)
Hemoglobin: 9.5 g/dL — ABNORMAL LOW (ref 13.0–17.0)
Immature Granulocytes: 1 %
Lymphocytes Relative: 13 %
Lymphs Abs: 0.6 10*3/uL — ABNORMAL LOW (ref 0.7–4.0)
MCH: 33.6 pg (ref 26.0–34.0)
MCHC: 34.1 g/dL (ref 30.0–36.0)
MCV: 98.6 fL (ref 80.0–100.0)
Monocytes Absolute: 0.5 10*3/uL (ref 0.1–1.0)
Monocytes Relative: 13 %
Neutro Abs: 3.1 10*3/uL (ref 1.7–7.7)
Neutrophils Relative %: 71 %
Platelet Count: 92 10*3/uL — ABNORMAL LOW (ref 150–400)
RBC: 2.83 MIL/uL — ABNORMAL LOW (ref 4.22–5.81)
RDW: 19.7 % — ABNORMAL HIGH (ref 11.5–15.5)
WBC Count: 4.3 10*3/uL (ref 4.0–10.5)
nRBC: 0 % (ref 0.0–0.2)

## 2021-10-10 LAB — CMP (CANCER CENTER ONLY)
ALT: 9 U/L (ref 0–44)
AST: 18 U/L (ref 15–41)
Albumin: 3.5 g/dL (ref 3.5–5.0)
Alkaline Phosphatase: 86 U/L (ref 38–126)
Anion gap: 4 — ABNORMAL LOW (ref 5–15)
BUN: 10 mg/dL (ref 8–23)
CO2: 29 mmol/L (ref 22–32)
Calcium: 9.3 mg/dL (ref 8.9–10.3)
Chloride: 107 mmol/L (ref 98–111)
Creatinine: 0.89 mg/dL (ref 0.61–1.24)
GFR, Estimated: >60 mL/min (ref >60)
Glucose, Bld: 95 mg/dL (ref 70–99)
Potassium: 3.4 mmol/L — ABNORMAL LOW (ref 3.5–5.1)
Sodium: 140 mmol/L (ref 135–145)
Total Bilirubin: 0.5 mg/dL (ref 0.3–1.2)
Total Protein: 6.6 g/dL (ref 6.5–8.1)

## 2021-10-10 LAB — TSH: TSH: 0.817 u[IU]/mL (ref 0.350–4.500)

## 2021-10-10 MED ORDER — SODIUM CHLORIDE 0.9% FLUSH
10.0000 mL | Freq: Once | INTRAVENOUS | Status: AC
  Administered 2021-10-10: 10 mL

## 2021-10-10 MED ORDER — HEPARIN SOD (PORK) LOCK FLUSH 100 UNIT/ML IV SOLN
500.0000 [IU] | Freq: Once | INTRAVENOUS | Status: AC
  Administered 2021-10-10: 500 [IU]

## 2021-10-10 MED ORDER — SODIUM CHLORIDE 0.9 % IV SOLN
350.0000 mg | Freq: Once | INTRAVENOUS | Status: AC
  Administered 2021-10-10: 350 mg via INTRAVENOUS
  Filled 2021-10-10: qty 7

## 2021-10-10 MED ORDER — SODIUM CHLORIDE 0.9 % IV SOLN: Freq: Once | INTRAVENOUS | Status: AC

## 2021-10-10 NOTE — Progress Notes (Signed)
[name] [phone]   [phone]  OFFICE PROGRESS NOTE  Edward Kaufman, PA-C [address] [place] [address]  DIAGNOSIS: Stage IV (T3, N1, M1b) non-small cell lung cancer favoring squamous cell carcinoma presented with superior segment left lower lobe lung mass with invasion of the chest wall and destruction of the posterior left sixth and seventh ribs as well as left hilar adenopathy and suspicious pleural metastasis diagnosed in March 2023  PRIOR THERAPY: Palliative radiotherapy to the left lower lobe lung mass with chest wall invasion under the care of Dr. Sondra Come  CURRENT THERAPY: Palliative systemic chemotherapy with carboplatin for AUC of 5, paclitaxel 175 Mg/M2 and Libtayo (Cempilimab) 350 Mg IV every 3 weeks with Neulasta support.  First dose was given on July 18, 2021.  Status post 4 cycles.  Starting from cycle #5 the patient will be on maintenance treatment with single agent Libtayo (Cempilimab) 350 Mg IV every 3 weeks.  INTERVAL HISTORY: Edward Beck 76 y.o. male returns to the clinic today for follow-up visit accompanied by his wife.  The patient is feeling fine today with no concerning complaints except for poor appetite.  He lost 10 pounds in the last few weeks but he started eating much better.  He denied having any chest pain, shortness of breath, cough or hemoptysis.  He has no nausea, vomiting, diarrhea or constipation.  He has no headache or visual changes.  He is here today for evaluation before starting the first cycle of his maintenance treatment with Libtayo (Cempilimab).   MEDICAL HISTORY: Past Medical History:  Diagnosis Date   Condyloma acuminatum of scrotum    COVID 2021   December 2022 - mild   History of radiation therapy    Left Lung- 07/23/21-08/02/21- Dr. Gery Pray   Lung cancer [facility])    Pneumonia    Scrotal lesion     ALLERGIES:  is allergic to tamiflu [oseltamivir phosphate].  MEDICATIONS:   Current Outpatient Medications  Medication Sig Dispense Refill   finasteride (PROSCAR) 5 MG tablet Take 1 tablet (5 mg total) by mouth daily. 90 tablet 3   lidocaine-prilocaine (EMLA) cream Apply to the Port-A-Cath site 30 minutes before chemotherapy. 30 g 0   potassium chloride SA (KLOR-CON M) 20 MEQ tablet Take 1 tablet (20 mEq total) by mouth 2 (two) times daily. 14 tablet 0   prochlorperazine (COMPAZINE) 10 MG tablet Take 1 tablet (10 mg total) by mouth every 6 (six) hours as needed for nausea or vomiting. (Patient not taking: Reported on 09/19/2021) 30 tablet 0   triamcinolone cream (KENALOG) 0.1 % Apply 1 application. topically 2 (two) times daily as needed (dry skin). (Patient not taking: Reported on 09/19/2021)     umeclidinium-vilanterol (ANORO ELLIPTA) 62.5-25 MCG/ACT AEPB Inhale 1 puff into the lungs daily. (Patient not taking: Reported on 09/12/2021) 7 each 0   No current facility-administered medications for this visit.    SURGICAL HISTORY:  Past Surgical History:  Procedure Laterality Date   BRONCHIAL BIOPSY  07/02/2021   Procedure: BRONCHIAL BIOPSIES;  Surgeon: Garner Nash, DO;  Location: [facility];  Service: Pulmonary;;   BRONCHIAL BRUSHINGS  07/02/2021   Procedure: BRONCHIAL BRUSHINGS;  Surgeon: Garner Nash, DO;  Location: [facility];  Service: Pulmonary;;   BRONCHIAL NEEDLE ASPIRATION BIOPSY  07/02/2021   Procedure: BRONCHIAL NEEDLE ASPIRATION BIOPSIES;  Surgeon: Garner Nash, DO;  Location: [facility];  Service: Pulmonary;;   HERNIA REPAIR  2009   left inguinal   IR IMAGING GUIDED PORT INSERTION  07/16/2021   SCROTAL EXPLORATION  11/21/2011   Procedure: SCROTUM EXPLORATION;  Surgeon: Hanley Ben, MD;  Location[facility];  Service: Urology;  Laterality: N/A;  EXCISION, BIOPSY SCROTAL CONDYLOMA    VIDEO BRONCHOSCOPY WITH ENDOBRONCHIAL ULTRASOUND Bilateral 07/02/2021   Procedure: VIDEO BRONCHOSCOPY WITH ENDOBRONCHIAL ULTRASOUND;  Surgeon: Garner Nash,  DO;  Location: [facility];  Service: Pulmonary;  Laterality: Bilateral;    REVIEW OF SYSTEMS:  A comprehensive review of systems was negative except for: Constitutional: positive for fatigue and weight loss   PHYSICAL EXAMINATION: General appearance: alert, cooperative, fatigued, and no distress Head: Normocephalic, without obvious abnormality, atraumatic Neck: no adenopathy, no JVD, supple, symmetrical, trachea midline, and thyroid not enlarged, symmetric, no tenderness/mass/nodules Lymph nodes: Cervical, supraclavicular, and axillary nodes normal. Resp: clear to auscultation bilaterally Back: symmetric, no curvature. ROM normal. No CVA tenderness. Cardio: regular rate and rhythm, S1, S2 normal, no murmur, click, rub or gallop GI: soft, non-tender; bowel sounds normal; no masses,  no organomegaly Extremities: extremities normal, atraumatic, no cyanosis or edema  ECOG PERFORMANCE STATUS: 1 - Symptomatic but completely ambulatory  Blood pressure 133/83, pulse (!) 55, temperature 97.6 F (36.4 C), temperature source Oral, resp. rate 18, weight 153 lb (69.4 kg), SpO2 100 %.  LABORATORY DATA: Lab Results  Component Value Date   WBC 4.3 10/10/2021   HGB 9.5 (L) 10/10/2021   HCT 27.9 (L) 10/10/2021   MCV 98.6 10/10/2021   PLT 92 (L) 10/10/2021      Chemistry      Component Value Date/Time   NA 140 10/10/2021 1014   K 3.4 (L) 10/10/2021 1014   CL 107 10/10/2021 1014   CO2 29 10/10/2021 1014   BUN 10 10/10/2021 1014   CREATININE 0.89 10/10/2021 1014      Component Value Date/Time   CALCIUM 9.3 10/10/2021 1014   ALKPHOS 86 10/10/2021 1014   AST 18 10/10/2021 1014   ALT 9 10/10/2021 1014   BILITOT 0.5 10/10/2021 1014       RADIOGRAPHIC STUDIES: CT Abdomen Pelvis W Contrast  Result Date: 09/18/2021 CLINICAL DATA:  Metastatic lung cancer restaging, assess treatment response * Tracking Code: BO * EXAM: CT CHEST, ABDOMEN, AND PELVIS WITH CONTRAST TECHNIQUE: Multidetector CT  imaging of the chest, abdomen and pelvis was performed following the standard protocol during bolus administration of intravenous contrast. RADIATION DOSE REDUCTION: This exam was performed according to the departmental dose-optimization program which includes automated exposure control, adjustment of the mA and/or kV according to patient size and/or use of iterative reconstruction technique. CONTRAST:  132mL OMNIPAQUE IOHEXOL 300 MG/ML SOLN, additional oral enteric contrast COMPARISON:  CT chest, 07/03/2021, PET-CT, 06/07/2021 FINDINGS: CT CHEST FINDINGS Cardiovascular: Right chest port catheter. Aortic atherosclerosis. Normal heart size. Scattered left coronary artery calcifications. No pericardial effusion. Mediastinum/Nodes: Previously seen FDG avid left hilar lymphadenopathy is resolved (series 2, image 35). Thyroid gland, trachea, and esophagus demonstrate no significant findings. Lungs/Pleura: Moderate centrilobular and paraseptal emphysema. Diffuse bilateral bronchial wall thickening. Significant interval decrease in size of a spiculated mass of the paramedian superior segment right lower lobe, measuring 3.5 x 2.1 cm, previously 4.6 x 3.5 cm when measured similarly (series 4, image 63). Trace residual left pleural effusion, improved compared to prior examination. Improved atelectasis or consolidation of the peripheral left upper lobe as well as of the dependent left lower lobe (series 4, image 79, 115). Musculoskeletal: Slight interval healing of lytic  lesions of the posterior left sixth and seventh ribs underlying mass. CT ABDOMEN PELVIS FINDINGS Hepatobiliary: No solid liver abnormality is seen. No gallstones, gallbladder wall thickening, or biliary dilatation. Pancreas: Unremarkable. No pancreatic ductal dilatation or surrounding inflammatory changes. Spleen: Normal in size without significant abnormality. Adrenals/Urinary Tract: Adrenal glands are unremarkable. Small nonobstructive calculus of the  inferior pole of the right kidney. Simple, benign small right renal cortical cysts, for which no further follow-up or characterization is required. Left kidney is normal. No calculi or hydronephrosis. Bladder is unremarkable. Stomach/Bowel: Stomach is within normal limits. Appendix appears normal. No evidence of bowel wall thickening, distention, or inflammatory changes. Descending and sigmoid diverticulosis. Vascular/Lymphatic: Aortic atherosclerosis. No enlarged abdominal or pelvic lymph nodes. Reproductive: Prostatomegaly. Other: No abdominal wall hernia or abnormality. No ascites. Musculoskeletal: No acute osseous findings. IMPRESSION: 1. Significant interval decrease in size of a spiculated mass of the paramedian superior segment right lower lobe. 2. Trace residual left pleural effusion, improved compared to prior examination. Improved atelectasis or consolidation of the peripheral left upper lobe as well as of the dependent left lower lobe. 3. Previously seen FDG avid left hilar lymphadenopathy is resolved. 4. Slight interval healing of invasive lytic lesions of the posterior left sixth and seventh ribs underlying mass. 5. Constellation of findings is consistent with treatment response of primary lung malignancy, pleural metastatic disease, and hilar nodal metastatic disease. 6. No evidence of new metastatic disease in the abdomen or pelvis. 7. Prostatomegaly. 8. Emphysema and diffuse bilateral bronchial wall thickening. 9. Coronary artery disease. Aortic Atherosclerosis (ICD10-I70.0) and Emphysema (ICD10-J43.9). Electronically Signed   By: Delanna Ahmadi M.D.   On: 09/18/2021 10:28   CT Chest W Contrast  Result Date: 09/18/2021 CLINICAL DATA:  Metastatic lung cancer restaging, assess treatment response * Tracking Code: BO * EXAM: CT CHEST, ABDOMEN, AND PELVIS WITH CONTRAST TECHNIQUE: Multidetector CT imaging of the chest, abdomen and pelvis was performed following the standard protocol during bolus  administration of intravenous contrast. RADIATION DOSE REDUCTION: This exam was performed according to the departmental dose-optimization program which includes automated exposure control, adjustment of the mA and/or kV according to patient size and/or use of iterative reconstruction technique. CONTRAST:  157mL OMNIPAQUE IOHEXOL 300 MG/ML SOLN, additional oral enteric contrast COMPARISON:  CT chest, 07/03/2021, PET-CT, 06/07/2021 FINDINGS: CT CHEST FINDINGS Cardiovascular: Right chest port catheter. Aortic atherosclerosis. Normal heart size. Scattered left coronary artery calcifications. No pericardial effusion. Mediastinum/Nodes: Previously seen FDG avid left hilar lymphadenopathy is resolved (series 2, image 35). Thyroid gland, trachea, and esophagus demonstrate no significant findings. Lungs/Pleura: Moderate centrilobular and paraseptal emphysema. Diffuse bilateral bronchial wall thickening. Significant interval decrease in size of a spiculated mass of the paramedian superior segment right lower lobe, measuring 3.5 x 2.1 cm, previously 4.6 x 3.5 cm when measured similarly (series 4, image 63). Trace residual left pleural effusion, improved compared to prior examination. Improved atelectasis or consolidation of the peripheral left upper lobe as well as of the dependent left lower lobe (series 4, image 79, 115). Musculoskeletal: Slight interval healing of lytic lesions of the posterior left sixth and seventh ribs underlying mass. CT ABDOMEN PELVIS FINDINGS Hepatobiliary: No solid liver abnormality is seen. No gallstones, gallbladder wall thickening, or biliary dilatation. Pancreas: Unremarkable. No pancreatic ductal dilatation or surrounding inflammatory changes. Spleen: Normal in size without significant abnormality. Adrenals/Urinary Tract: Adrenal glands are unremarkable. Small nonobstructive calculus of the inferior pole of the right kidney. Simple, benign small right renal cortical cysts, for which no further  follow-up or characterization is required. Left kidney is normal. No calculi or hydronephrosis. Bladder is unremarkable. Stomach/Bowel: Stomach is within normal limits. Appendix appears normal. No evidence of bowel wall thickening, distention, or inflammatory changes. Descending and sigmoid diverticulosis. Vascular/Lymphatic: Aortic atherosclerosis. No enlarged abdominal or pelvic lymph nodes. Reproductive: Prostatomegaly. Other: No abdominal wall hernia or abnormality. No ascites. Musculoskeletal: No acute osseous findings. IMPRESSION: 1. Significant interval decrease in size of a spiculated mass of the paramedian superior segment right lower lobe. 2. Trace residual left pleural effusion, improved compared to prior examination. Improved atelectasis or consolidation of the peripheral left upper lobe as well as of the dependent left lower lobe. 3. Previously seen FDG avid left hilar lymphadenopathy is resolved. 4. Slight interval healing of invasive lytic lesions of the posterior left sixth and seventh ribs underlying mass. 5. Constellation of findings is consistent with treatment response of primary lung malignancy, pleural metastatic disease, and hilar nodal metastatic disease. 6. No evidence of new metastatic disease in the abdomen or pelvis. 7. Prostatomegaly. 8. Emphysema and diffuse bilateral bronchial wall thickening. 9. Coronary artery disease. Aortic Atherosclerosis (ICD10-I70.0) and Emphysema (ICD10-J43.9). Electronically Signed   By: Delanna Ahmadi M.D.   On: 09/18/2021 10:28    ASSESSMENT AND PLAN: This is a very pleasant 76 years old white male with Stage IV (T3, N1, M1b) non-small cell lung cancer favoring squamous cell carcinoma presented with superior segment left lower lobe lung mass with invasion of the chest wall and destruction of the posterior left sixth and seventh ribs as well as left hilar adenopathy and suspicious pleural metastasis diagnosed in March 2023 He is currently undergoing  palliative radiotherapy to the left lower lobe lung mass with chest wall invasion under the care of Dr. Sondra Come. He is also undergoing systemic chemotherapy with carboplatin for AUC of 5, paclitaxel 175 Mg/M2 and Libtayo (Cempilimab) 350 Mg IV every 3 weeks.  He started the first dose of his treatment on July 18, 2021.  Status post 4 cycles.  He tolerated the last cycle of his treatment well with no concerning adverse effect except for fatigue and weight loss. Starting from cycle #5 the patient will be on maintenance treatment with single agent Libtayo (Cempilimab) 350 Mg IV every 3 weeks. I recommended for the patient to proceed with his treatment today as planned. I will see him back for follow-up visit in 3 weeks for evaluation before the next cycle of his treatment. The patient was advised to call immediately if he has any other concerning symptoms in the interval. The patient voices understanding of current disease status and treatment options and is in agreement with the current care plan.  All questions were answered. The patient knows to call the clinic with any problems, questions or concerns. We can certainly see the patient much sooner if necessary.  The total time spent in the appointment was 20 minutes.  Disclaimer: This note was dictated with voice recognition software. Similar sounding words can inadvertently be transcribed and may not be corrected upon review.

## 2021-10-10 NOTE — Patient Instructions (Addendum)
[facility] ONCOLOGY  Discharge Instructions: Thank you for choosing [facility] to provide your oncology and hematology care.   If you have a lab appointment with the [facility], please go directly to the [facility] and check in at the registration area.   Wear comfortable clothing and clothing appropriate for easy access to any Portacath or PICC line.   We strive to give you quality time with your provider. You may need to reschedule your appointment if you arrive late (15 or more minutes).  Arriving late affects you and other patients whose appointments are after yours.  Also, if you miss three or more appointments without notifying the office, you may be dismissed from the clinic at the provider's discretion.      For prescription refill requests, have your pharmacy contact our office and allow 72 hours for refills to be completed.    Today you received the following chemotherapy and/or immunotherapy agent: Cemiplimab (Libtayo)    To help prevent nausea and vomiting after your treatment, we encourage you to take your nausea medication as directed.  BELOW ARE SYMPTOMS THAT SHOULD BE REPORTED IMMEDIATELY: *FEVER GREATER THAN 100.4 F (38 C) OR HIGHER *CHILLS OR SWEATING *NAUSEA AND VOMITING THAT IS NOT CONTROLLED WITH YOUR NAUSEA MEDICATION *UNUSUAL SHORTNESS OF BREATH *UNUSUAL BRUISING OR BLEEDING *URINARY PROBLEMS (pain or burning when urinating, or frequent urination) *BOWEL PROBLEMS (unusual diarrhea, constipation, pain near the anus) TENDERNESS IN MOUTH AND THROAT WITH OR WITHOUT PRESENCE OF ULCERS (sore throat, sores in mouth, or a toothache) UNUSUAL RASH, SWELLING OR PAIN  UNUSUAL VAGINAL DISCHARGE OR ITCHING   Items with * indicate a potential emergency and should be followed up as soon as possible or go to the Emergency Department if any problems should occur.  Please show the CHEMOTHERAPY ALERT CARD or IMMUNOTHERAPY ALERT CARD at  check-in to the Emergency Department and triage nurse.  Should you have questions after your visit or need to cancel or reschedule your appointment, please contact [facility]  Dept: [phone]  and follow the prompts.  Office hours are 8:00 a.m. to 4:30 p.m. Monday - Friday. Please note that voicemails left after 4:00 p.m. may not be returned until the following business day.  We are closed weekends and major holidays. You have access to a nurse at all times for urgent questions. Please call the main number to the clinic Dept: [phone] and follow the prompts.   For any non-urgent questions, you may also contact your provider using MyChart. We now offer e-Visits for anyone 57 and older to request care online for non-urgent symptoms. For details visit [email or web address].   Also download the MyChart app! Go to the app store, search "MyChart", open the app, select [facility], and log in with your MyChart username and password.  Masks are optional in the cancer centers. If you would like for your care team to wear a mask while they are taking care of you, please let them know. For doctor visits, patients may have with them one support person who is at least 78 years old. At this time, visitors are not allowed in the infusion area.

## 2021-10-10 NOTE — Progress Notes (Signed)
Per Dr. Julien Nordmann ok for treatment today with platelets 92 K/uL.

## 2021-10-14 ENCOUNTER — Other Ambulatory Visit: Payer: Self-pay | Admitting: Internal Medicine

## 2021-10-15 ENCOUNTER — Telehealth: Payer: Self-pay

## 2021-10-15 NOTE — Telephone Encounter (Signed)
Pt called to say the prescription for potassium had not been sent to [facility]. Phone call to pharmacy confirmed receipt of the prescription and that the potassium tablets had been picked up on 10/12/21. Relayed conversation to patient's wife.

## 2021-10-25 NOTE — Progress Notes (Signed)
[facility] OFFICE PROGRESS NOTE  [name], PA-C [place] [address]  DIAGNOSIS: Stage IV (T3, N1, M1b) non-small cell lung cancer favoring squamous cell carcinoma presented with superior segment left lower lobe lung mass with invasion of the chest wall and destruction of the posterior left sixth and seventh ribs as well as left hilar adenopathy and suspicious pleural metastasis diagnosed in March 2023  PRIOR THERAPY: Palliative radiotherapy to the left lower lobe lung mass with chest wall invasion under the care of Dr. Sondra Come  CURRENT THERAPY: Palliative systemic chemotherapy with carboplatin for AUC of 5, paclitaxel 175 Mg/M2 and Libtayo (Cempilimab) 350 Mg IV every 3 weeks with Neulasta support.  First dose was given on July 18, 2021.  Status post 5 cycles.  Starting from cycle #5 the patient will be on maintenance treatment with single agent Libtayo (Cempilimab) 350 Mg IV every 3 weeks.  INTERVAL HISTORY: Edward Beck 76 y.o. male returns to the clinic today for follow-up visit accompanied by ***.  The patient is feeling fairly well today without any concerning complaints except for ***poor appetite?  Weight loss?  Nutritionist?  The patient is currently undergoing maintenance treatment with Libtayo.  He is tolerating this well without any concerning adverse side effects.  He denies any recent fever, chills, or night sweats.  He denies any chest pain, shortness of breath, cough, or hemoptysis.  He denies any nausea, vomiting, diarrhea, or constipation.  Denies any headache or visual changes.  He denies any rashes or skin changes.  He is here today for evaluation and repeat blood work before undergoing cycle number 6.  MEDICAL HISTORY: Past Medical History:  Diagnosis Date   Condyloma acuminatum of scrotum    COVID 2021   December 2022 - mild   History of radiation therapy    Left Lung- 07/23/21-08/02/21- Dr. Gery Pray   Lung cancer [facility])     Pneumonia    Scrotal lesion     ALLERGIES:  is allergic to tamiflu [oseltamivir phosphate].  MEDICATIONS:  Current Outpatient Medications  Medication Sig Dispense Refill   finasteride (PROSCAR) 5 MG tablet Take 1 tablet (5 mg total) by mouth daily. 90 tablet 3   lidocaine-prilocaine (EMLA) cream Apply to the Port-A-Cath site 30 minutes before chemotherapy. 30 g 0   potassium chloride SA (KLOR-CON M) 20 MEQ tablet Take 1 tablet (20 mEq total) by mouth 2 (two) times daily. 14 tablet 0   prochlorperazine (COMPAZINE) 10 MG tablet Take 1 tablet (10 mg total) by mouth every 6 (six) hours as needed for nausea or vomiting. (Patient not taking: Reported on 09/19/2021) 30 tablet 0   triamcinolone cream (KENALOG) 0.1 % Apply 1 application. topically 2 (two) times daily as needed (dry skin). (Patient not taking: Reported on 09/19/2021)     umeclidinium-vilanterol (ANORO ELLIPTA) 62.5-25 MCG/ACT AEPB Inhale 1 puff into the lungs daily. (Patient not taking: Reported on 09/12/2021) 7 each 0   No current facility-administered medications for this visit.    SURGICAL HISTORY:  Past Surgical History:  Procedure Laterality Date   BRONCHIAL BIOPSY  07/02/2021   Procedure: BRONCHIAL BIOPSIES;  Surgeon: Garner Nash, DO;  Location: [facility];  Service: Pulmonary;;   BRONCHIAL BRUSHINGS  07/02/2021   Procedure: BRONCHIAL BRUSHINGS;  Surgeon: Garner Nash, DO;  Location: [facility];  Service: Pulmonary;;   BRONCHIAL NEEDLE ASPIRATION BIOPSY  07/02/2021   Procedure: BRONCHIAL NEEDLE ASPIRATION BIOPSIES;  Surgeon: Garner Nash, DO;  Location: [facility];  Service: Pulmonary;;   HERNIA REPAIR  2009   left inguinal   IR IMAGING GUIDED PORT INSERTION  07/16/2021   SCROTAL EXPLORATION  11/21/2011   Procedure: SCROTUM EXPLORATION;  Surgeon: Hanley Ben, MD;  Location: [facility];  Service: Urology;  Laterality: N/A;  EXCISION, BIOPSY SCROTAL CONDYLOMA    VIDEO BRONCHOSCOPY WITH ENDOBRONCHIAL ULTRASOUND  Bilateral 07/02/2021   Procedure: VIDEO BRONCHOSCOPY WITH ENDOBRONCHIAL ULTRASOUND;  Surgeon: Garner Nash, DO;  Location: [facility];  Service: Pulmonary;  Laterality: Bilateral;    REVIEW OF SYSTEMS:   Review of Systems  Constitutional: Negative for appetite change, chills, fatigue, fever and unexpected weight change.  HENT:   Negative for mouth sores, nosebleeds, sore throat and trouble swallowing.   Eyes: Negative for eye problems and icterus.  Respiratory: Negative for cough, hemoptysis, shortness of breath and wheezing.   Cardiovascular: Negative for chest pain and leg swelling.  Gastrointestinal: Negative for abdominal pain, constipation, diarrhea, nausea and vomiting.  Genitourinary: Negative for bladder incontinence, difficulty urinating, dysuria, frequency and hematuria.   Musculoskeletal: Negative for back pain, gait problem, neck pain and neck stiffness.  Skin: Negative for itching and rash.  Neurological: Negative for dizziness, extremity weakness, gait problem, headaches, light-headedness and seizures.  Hematological: Negative for adenopathy. Does not bruise/bleed easily.  Psychiatric/Behavioral: Negative for confusion, depression and sleep disturbance. The patient is not nervous/anxious.     PHYSICAL EXAMINATION:  There were no vitals taken for this visit.  ECOG PERFORMANCE STATUS: {CHL ONC ECOG [id number]  Physical Exam  Constitutional: Oriented to person, place, and time and well-developed, well-nourished, and in no distress. No distress.  HENT:  Head: Normocephalic and atraumatic.  Mouth/Throat: Oropharynx is clear and moist. No oropharyngeal exudate.  Eyes: Conjunctivae are normal. Right eye exhibits no discharge. Left eye exhibits no discharge. No scleral icterus.  Neck: Normal range of motion. Neck supple.  Cardiovascular: Normal rate, regular rhythm, normal heart sounds and intact distal pulses.   Pulmonary/Chest: Effort normal and breath sounds normal.  No respiratory distress. No wheezes. No rales.  Abdominal: Soft. Bowel sounds are normal. Exhibits no distension and no mass. There is no tenderness.  Musculoskeletal: Normal range of motion. Exhibits no edema.  Lymphadenopathy:    No cervical adenopathy.  Neurological: Alert and oriented to person, place, and time. Exhibits normal muscle tone. Gait normal. Coordination normal.  Skin: Skin is warm and dry. No rash noted. Not diaphoretic. No erythema. No pallor.  Psychiatric: Mood, memory and judgment normal.  Vitals reviewed.  LABORATORY DATA: Lab Results  Component Value Date   WBC 4.3 10/10/2021   HGB 9.5 (L) 10/10/2021   HCT 27.9 (L) 10/10/2021   MCV 98.6 10/10/2021   PLT 92 (L) 10/10/2021      Chemistry      Component Value Date/Time   NA 140 10/10/2021 1014   K 3.4 (L) 10/10/2021 1014   CL 107 10/10/2021 1014   CO2 29 10/10/2021 1014   BUN 10 10/10/2021 1014   CREATININE 0.89 10/10/2021 1014      Component Value Date/Time   CALCIUM 9.3 10/10/2021 1014   ALKPHOS 86 10/10/2021 1014   AST 18 10/10/2021 1014   ALT 9 10/10/2021 1014   BILITOT 0.5 10/10/2021 1014       RADIOGRAPHIC STUDIES:  No results found.   ASSESSMENT/PLAN:  This is a very pleasant 76 year old Caucasian male with stage IV (T3, N1, M1 B) non-small cell lung cancer, favoring squamous cell carcinoma.  The  patient presented with a superior left lower lobe lung mass with invasion of the chest wall and destruction of the posterior left sixth and seventh ribs as well as left hilar adenopathy.  The patient also has suspicious pleural metastasis.  The patient was diagnosed in March 2023.  The patient completed palliative radiotherapy to the left lower lobe lung mass/chest wall under the care of Dr. Sondra Come.  The patient is currently undergoing systemic chemotherapy with carboplatin for an AUC of 5, paclitaxel 175 mg per metered square, Libtayo 350 mg IV every 3 weeks.  First dose 07/18/2021.  The patient is  status post left 5 cycles of treatment.   The patient was seen with Dr. Julien Nordmann today.  Labs were reviewed.  Recommend that he proceed with cycle #6 today's schedule.  I will arrange for restaging CT scan of the chest, abdomen, pelvis prior to starting his next cycle of treatment.  We will see him back for follow-up visit in 3 weeks for evaluation to review his scan results before starting cycle #7.  Nutritionist?  The patient was advised to call immediately if she has any concerning symptoms in the interval. The patient voices understanding of current disease status and treatment options and is in agreement with the current care plan. All questions were answered. The patient knows to call the clinic with any problems, questions or concerns. We can certainly see the patient much sooner if necessary     No orders of the defined types were placed in this encounter.    I spent {CHL ONC TIME VISIT - ZJIRC:[id number]} counseling the patient face to face. The total time spent in the appointment was {CHL ONC TIME VISIT - ZWCHE:[id number]}.  Illana Nolting L Arsalan Brisbin, PA-C 10/25/21

## 2021-10-28 ENCOUNTER — Other Ambulatory Visit: Payer: Self-pay

## 2021-10-30 ENCOUNTER — Inpatient Hospital Stay: Payer: Medicare Other

## 2021-10-30 ENCOUNTER — Inpatient Hospital Stay (HOSPITAL_BASED_OUTPATIENT_CLINIC_OR_DEPARTMENT_OTHER): Payer: Medicare Other | Admitting: Physician Assistant

## 2021-10-30 ENCOUNTER — Other Ambulatory Visit: Payer: Self-pay

## 2021-10-30 VITALS — BP 132/88 | HR 82 | Temp 97.7°F | Resp 16 | Wt 150.0 lb

## 2021-10-30 DIAGNOSIS — C7989 Secondary malignant neoplasm of other specified sites: Secondary | ICD-10-CM | POA: Diagnosis not present

## 2021-10-30 DIAGNOSIS — Z79899 Other long term (current) drug therapy: Secondary | ICD-10-CM | POA: Diagnosis not present

## 2021-10-30 DIAGNOSIS — C3492 Malignant neoplasm of unspecified part of left bronchus or lung: Secondary | ICD-10-CM

## 2021-10-30 DIAGNOSIS — Z95828 Presence of other vascular implants and grafts: Secondary | ICD-10-CM

## 2021-10-30 DIAGNOSIS — C3432 Malignant neoplasm of lower lobe, left bronchus or lung: Secondary | ICD-10-CM | POA: Diagnosis not present

## 2021-10-30 DIAGNOSIS — Z5111 Encounter for antineoplastic chemotherapy: Secondary | ICD-10-CM

## 2021-10-30 DIAGNOSIS — G6289 Other specified polyneuropathies: Secondary | ICD-10-CM | POA: Diagnosis not present

## 2021-10-30 DIAGNOSIS — C7951 Secondary malignant neoplasm of bone: Secondary | ICD-10-CM | POA: Diagnosis not present

## 2021-10-30 DIAGNOSIS — R5383 Other fatigue: Secondary | ICD-10-CM

## 2021-10-30 DIAGNOSIS — Z5112 Encounter for antineoplastic immunotherapy: Secondary | ICD-10-CM | POA: Diagnosis not present

## 2021-10-30 LAB — CMP (CANCER CENTER ONLY)
ALT: 10 U/L (ref 0–44)
AST: 16 U/L (ref 15–41)
Albumin: 3.8 g/dL (ref 3.5–5.0)
Alkaline Phosphatase: 89 U/L (ref 38–126)
Anion gap: 5 (ref 5–15)
BUN: 13 mg/dL (ref 8–23)
CO2: 26 mmol/L (ref 22–32)
Calcium: 9.8 mg/dL (ref 8.9–10.3)
Chloride: 108 mmol/L (ref 98–111)
Creatinine: 0.92 mg/dL (ref 0.61–1.24)
GFR, Estimated: >60 mL/min (ref >60)
Glucose, Bld: 98 mg/dL (ref 70–99)
Potassium: 3.5 mmol/L (ref 3.5–5.1)
Sodium: 139 mmol/L (ref 135–145)
Total Bilirubin: 0.5 mg/dL (ref 0.3–1.2)
Total Protein: 7.4 g/dL (ref 6.5–8.1)

## 2021-10-30 LAB — CBC WITH DIFFERENTIAL (CANCER CENTER ONLY)
Abs Immature Granulocytes: 0.03 10*3/uL (ref 0.00–0.07)
Basophils Absolute: 0.1 10*3/uL (ref 0.0–0.1)
Basophils Relative: 1 %
Eosinophils Absolute: 0.1 10*3/uL (ref 0.0–0.5)
Eosinophils Relative: 2 %
HCT: 31.6 % — ABNORMAL LOW (ref 39.0–52.0)
Hemoglobin: 11.1 g/dL — ABNORMAL LOW (ref 13.0–17.0)
Immature Granulocytes: 1 %
Lymphocytes Relative: 8 %
Lymphs Abs: 0.6 10*3/uL — ABNORMAL LOW (ref 0.7–4.0)
MCH: 35.6 pg — ABNORMAL HIGH (ref 26.0–34.0)
MCHC: 35.1 g/dL (ref 30.0–36.0)
MCV: 101.3 fL — ABNORMAL HIGH (ref 80.0–100.0)
Monocytes Absolute: 0.6 10*3/uL (ref 0.1–1.0)
Monocytes Relative: 9 %
Neutro Abs: 5.3 10*3/uL (ref 1.7–7.7)
Neutrophils Relative %: 79 %
Platelet Count: 233 10*3/uL (ref 150–400)
RBC: 3.12 MIL/uL — ABNORMAL LOW (ref 4.22–5.81)
RDW: 16 % — ABNORMAL HIGH (ref 11.5–15.5)
WBC Count: 6.7 10*3/uL (ref 4.0–10.5)
nRBC: 0 % (ref 0.0–0.2)

## 2021-10-30 LAB — TSH: TSH: 1.062 u[IU]/mL (ref 0.350–4.500)

## 2021-10-30 MED ORDER — SODIUM CHLORIDE 0.9% FLUSH
10.0000 mL | Freq: Once | INTRAVENOUS | Status: AC
  Administered 2021-10-30: 10 mL via INTRAVENOUS

## 2021-10-30 MED ORDER — SODIUM CHLORIDE 0.9 % IV SOLN
350.0000 mg | Freq: Once | INTRAVENOUS | Status: AC
  Administered 2021-10-30: 350 mg via INTRAVENOUS
  Filled 2021-10-30: qty 7

## 2021-10-30 MED ORDER — HEPARIN SOD (PORK) LOCK FLUSH 100 UNIT/ML IV SOLN
500.0000 [IU] | Freq: Once | INTRAVENOUS | Status: AC
  Administered 2021-10-30: 500 [IU] via INTRAVENOUS

## 2021-10-30 MED ORDER — SODIUM CHLORIDE 0.9 % IV SOLN: INTRAVENOUS | Status: DC

## 2021-10-30 MED ORDER — GABAPENTIN 100 MG PO CAPS: 100.0000 mg | ORAL_CAPSULE | Freq: Three times a day (TID) | ORAL | 2 refills | Status: DC

## 2021-10-30 MED ORDER — SODIUM CHLORIDE 0.9% FLUSH
10.0000 mL | Freq: Once | INTRAVENOUS | Status: AC
  Administered 2021-10-30: 10 mL

## 2021-10-30 NOTE — Patient Instructions (Signed)
[facility]  Discharge Instructions: Thank you for choosing [facility] to provide your oncology and hematology care.   If you have a lab appointment with the [facility], please go directly to the [facility] and check in at the registration area.   Wear comfortable clothing and clothing appropriate for easy access to any Portacath or PICC line.   We strive to give you quality time with your provider. You may need to reschedule your appointment if you arrive late (15 or more minutes).  Arriving late affects you and other patients whose appointments are after yours.  Also, if you miss three or more appointments without notifying the office, you may be dismissed from the clinic at the provider's discretion.      For prescription refill requests, have your pharmacy contact our office and allow 72 hours for refills to be completed.    Today you received the following chemotherapy and/or immunotherapy agents libtayo      To help prevent nausea and vomiting after your treatment, we encourage you to take your nausea medication as directed.  BELOW ARE SYMPTOMS THAT SHOULD BE REPORTED IMMEDIATELY: *FEVER GREATER THAN 100.4 F (38 C) OR HIGHER *CHILLS OR SWEATING *NAUSEA AND VOMITING THAT IS NOT CONTROLLED WITH YOUR NAUSEA MEDICATION *UNUSUAL SHORTNESS OF BREATH *UNUSUAL BRUISING OR BLEEDING *URINARY PROBLEMS (pain or burning when urinating, or frequent urination) *BOWEL PROBLEMS (unusual diarrhea, constipation, pain near the anus) TENDERNESS IN MOUTH AND THROAT WITH OR WITHOUT PRESENCE OF ULCERS (sore throat, sores in mouth, or a toothache) UNUSUAL RASH, SWELLING OR PAIN  UNUSUAL VAGINAL DISCHARGE OR ITCHING   Items with * indicate a potential emergency and should be followed up as soon as possible or go to the Emergency Department if any problems should occur.  Please show the CHEMOTHERAPY ALERT CARD or IMMUNOTHERAPY ALERT CARD at check-in to the  Emergency Department and triage nurse.  Should you have questions after your visit or need to cancel or reschedule your appointment, please contact [facility]  Dept: [phone]  and follow the prompts.  Office hours are 8:00 a.m. to 4:30 p.m. Monday - Friday. Please note that voicemails left after 4:00 p.m. may not be returned until the following business day.  We are closed weekends and major holidays. You have access to a nurse at all times for urgent questions. Please call the main number to the clinic Dept: [phone] and follow the prompts.   For any non-urgent questions, you may also contact your provider using MyChart. We now offer e-Visits for anyone 26 and older to request care online for non-urgent symptoms. For details visit [email or web address].   Also download the MyChart app! Go to the app store, search "MyChart", open the app, select [facility], and log in with your MyChart username and password.  Masks are optional in the cancer centers. If you would like for your care team to wear a mask while they are taking care of you, please let them know. For doctor visits, patients may have with them one support person who is at least 78 years old. At this time, visitors are not allowed in the infusion area.

## 2021-11-16 ENCOUNTER — Ambulatory Visit (HOSPITAL_COMMUNITY)
Admission: RE | Admit: 2021-11-16 | Discharge: 2021-11-16 | Disposition: A | Discharge status: Home / Self Care | Payer: Medicare Other | Source: Ambulatory Visit | Attending: Physician Assistant | Admitting: Physician Assistant

## 2021-11-16 DIAGNOSIS — N200 Calculus of kidney: Secondary | ICD-10-CM | POA: Diagnosis not present

## 2021-11-16 DIAGNOSIS — J439 Emphysema, unspecified: Secondary | ICD-10-CM | POA: Diagnosis not present

## 2021-11-16 DIAGNOSIS — C3492 Malignant neoplasm of unspecified part of left bronchus or lung: Secondary | ICD-10-CM | POA: Diagnosis not present

## 2021-11-16 MED ORDER — IOHEXOL 300 MG/ML  SOLN
100.0000 mL | Freq: Once | INTRAMUSCULAR | Status: AC | PRN
  Administered 2021-11-16: 100 mL via INTRAVENOUS

## 2021-11-18 DIAGNOSIS — L570 Actinic keratosis: Secondary | ICD-10-CM | POA: Diagnosis not present

## 2021-11-18 DIAGNOSIS — L309 Dermatitis, unspecified: Secondary | ICD-10-CM | POA: Diagnosis not present

## 2021-11-20 ENCOUNTER — Inpatient Hospital Stay (HOSPITAL_BASED_OUTPATIENT_CLINIC_OR_DEPARTMENT_OTHER): Payer: Medicare Other | Admitting: Internal Medicine

## 2021-11-20 ENCOUNTER — Inpatient Hospital Stay: Payer: Medicare Other | Attending: Internal Medicine

## 2021-11-20 ENCOUNTER — Telehealth: Payer: Self-pay

## 2021-11-20 ENCOUNTER — Inpatient Hospital Stay: Payer: Medicare Other

## 2021-11-20 ENCOUNTER — Other Ambulatory Visit: Payer: Self-pay | Admitting: Physician Assistant

## 2021-11-20 ENCOUNTER — Other Ambulatory Visit: Payer: Self-pay

## 2021-11-20 VITALS — BP 103/62 | HR 76 | Temp 97.1°F | Resp 18 | Wt 138.3 lb

## 2021-11-20 DIAGNOSIS — Z5111 Encounter for antineoplastic chemotherapy: Secondary | ICD-10-CM

## 2021-11-20 DIAGNOSIS — Z95828 Presence of other vascular implants and grafts: Secondary | ICD-10-CM

## 2021-11-20 DIAGNOSIS — G893 Neoplasm related pain (acute) (chronic): Secondary | ICD-10-CM

## 2021-11-20 DIAGNOSIS — G6289 Other specified polyneuropathies: Secondary | ICD-10-CM

## 2021-11-20 DIAGNOSIS — Z5112 Encounter for antineoplastic immunotherapy: Secondary | ICD-10-CM

## 2021-11-20 DIAGNOSIS — C3492 Malignant neoplasm of unspecified part of left bronchus or lung: Secondary | ICD-10-CM | POA: Diagnosis not present

## 2021-11-20 DIAGNOSIS — C7951 Secondary malignant neoplasm of bone: Secondary | ICD-10-CM | POA: Diagnosis not present

## 2021-11-20 DIAGNOSIS — Z79899 Other long term (current) drug therapy: Secondary | ICD-10-CM | POA: Insufficient documentation

## 2021-11-20 DIAGNOSIS — E860 Dehydration: Secondary | ICD-10-CM | POA: Insufficient documentation

## 2021-11-20 DIAGNOSIS — E876 Hypokalemia: Secondary | ICD-10-CM | POA: Insufficient documentation

## 2021-11-20 DIAGNOSIS — C3432 Malignant neoplasm of lower lobe, left bronchus or lung: Secondary | ICD-10-CM | POA: Insufficient documentation

## 2021-11-20 DIAGNOSIS — E538 Deficiency of other specified B group vitamins: Secondary | ICD-10-CM

## 2021-11-20 DIAGNOSIS — R5383 Other fatigue: Secondary | ICD-10-CM

## 2021-11-20 LAB — CBC WITH DIFFERENTIAL (CANCER CENTER ONLY)
Abs Immature Granulocytes: 0.08 10*3/uL — ABNORMAL HIGH (ref 0.00–0.07)
Basophils Absolute: 0.1 10*3/uL (ref 0.0–0.1)
Basophils Relative: 1 %
Eosinophils Absolute: 0.1 10*3/uL (ref 0.0–0.5)
Eosinophils Relative: 2 %
HCT: 30.8 % — ABNORMAL LOW (ref 39.0–52.0)
Hemoglobin: 11.2 g/dL — ABNORMAL LOW (ref 13.0–17.0)
Immature Granulocytes: 1 %
Lymphocytes Relative: 5 %
Lymphs Abs: 0.5 10*3/uL — ABNORMAL LOW (ref 0.7–4.0)
MCH: 34.6 pg — ABNORMAL HIGH (ref 26.0–34.0)
MCHC: 36.4 g/dL — ABNORMAL HIGH (ref 30.0–36.0)
MCV: 95.1 fL (ref 80.0–100.0)
Monocytes Absolute: 0.9 10*3/uL (ref 0.1–1.0)
Monocytes Relative: 9 %
Neutro Abs: 8 10*3/uL — ABNORMAL HIGH (ref 1.7–7.7)
Neutrophils Relative %: 82 %
Platelet Count: 267 10*3/uL (ref 150–400)
RBC: 3.24 MIL/uL — ABNORMAL LOW (ref 4.22–5.81)
RDW: 12.7 % (ref 11.5–15.5)
WBC Count: 9.7 10*3/uL (ref 4.0–10.5)
nRBC: 0 % (ref 0.0–0.2)

## 2021-11-20 LAB — CMP (CANCER CENTER ONLY)
ALT: 16 U/L (ref 0–44)
AST: 18 U/L (ref 15–41)
Albumin: 3.1 g/dL — ABNORMAL LOW (ref 3.5–5.0)
Alkaline Phosphatase: 70 U/L (ref 38–126)
Anion gap: 5 (ref 5–15)
BUN: 24 mg/dL — ABNORMAL HIGH (ref 8–23)
CO2: 20 mmol/L — ABNORMAL LOW (ref 22–32)
Calcium: 11.3 mg/dL — ABNORMAL HIGH (ref 8.9–10.3)
Chloride: 110 mmol/L (ref 98–111)
Creatinine: 1.27 mg/dL — ABNORMAL HIGH (ref 0.61–1.24)
GFR, Estimated: 58 mL/min — ABNORMAL LOW (ref >60)
Glucose, Bld: 106 mg/dL — ABNORMAL HIGH (ref 70–99)
Potassium: 2.1 mmol/L — CL (ref 3.5–5.1)
Sodium: 135 mmol/L (ref 135–145)
Total Bilirubin: 0.3 mg/dL (ref 0.3–1.2)
Total Protein: 7.5 g/dL (ref 6.5–8.1)

## 2021-11-20 LAB — VITAMIN B12: Vitamin B-12: 157 pg/mL — ABNORMAL LOW (ref 180–914)

## 2021-11-20 LAB — TSH: TSH: 1.167 u[IU]/mL (ref 0.350–4.500)

## 2021-11-20 MED ORDER — POTASSIUM CHLORIDE CRYS ER 20 MEQ PO TBCR: 20.0000 meq | EXTENDED_RELEASE_TABLET | Freq: Two times a day (BID) | ORAL | 0 refills | Status: DC

## 2021-11-20 MED ORDER — SODIUM CHLORIDE 0.9 % IV SOLN: Freq: Once | INTRAVENOUS | Status: AC

## 2021-11-20 MED ORDER — POTASSIUM CHLORIDE IN NACL 40-0.9 MEQ/L-% IV SOLN
Freq: Once | INTRAVENOUS | Status: AC
  Filled 2021-11-20: qty 1000

## 2021-11-20 MED ORDER — SODIUM CHLORIDE 0.9% FLUSH
10.0000 mL | Freq: Once | INTRAVENOUS | Status: AC
  Administered 2021-11-20: 10 mL

## 2021-11-20 MED ORDER — HEPARIN SOD (PORK) LOCK FLUSH 100 UNIT/ML IV SOLN
500.0000 [IU] | Freq: Once | INTRAVENOUS | Status: AC
  Administered 2021-11-20: 500 [IU]

## 2021-11-20 NOTE — Patient Instructions (Addendum)
Rehydration, Elderly Rehydration is the replacement of body fluids, salts, and minerals (electrolytes) that are lost during dehydration. Dehydration is when there is not enough water or other fluids in the body. This happens when you lose more fluids than you take in. People who are age 78 or older have a higher risk of dehydration than younger adults. Common causes of dehydration include: Conditions that cause loss of water or other fluids, such as diarrhea, vomiting, sweating, or urinating a lot. Not drinking enough fluids. This can occur when you are ill or doing activities that require a lot of energy, especially in hot weather. Other illnesses and conditions, such as fever or infection. Certain medicines, such as those that remove excess fluid from the body (diuretics). Not being able to get enough water and food. Symptoms of mild or moderate dehydration may include thirst, dry lips and mouth, and dizziness. Symptoms of severe dehydration may include increased heart rate, confusion, fainting, and not urinating. For severe dehydration, you may need to get fluids through an IV at the hospital. For mild or moderate dehydration, you can usually rehydrate at home by drinking certain fluids as told by your health care provider. What are the risks? Generally, rehydration is safe. However, taking in too much fluid (overhydration) can be a problem. This is rare. Overhydration can cause an electrolyte imbalance, kidney failure, fluid in the lungs, or a decrease in salt (sodium) levels in the body. Supplies needed: You will need an oral rehydration solution (ORS) if your health care provider tells you to use one. This is a drink designed to treat dehydration. It can be found in pharmacies and retail stores. How to rehydrate Fluids Follow instructions from your health care provider for rehydration. The kind of fluid and the amount you should drink depend on your condition. In general, for mild dehydration,  you should choose drinks that you prefer. If told by your health care provider, drink an ORS. Make an ORS by following instructions on the package. Start by drinking small amounts, about  cup (120 mL) every 5-10 minutes. Slowly increase how much you drink until you have taken the amount recommended by your health care provider. Drink enough fluids to keep your urine pale yellow. If you were told to drink an ORS, finish the ORS first, then start slowly drinking other clear fluids. Drink fluids such as: Water. This includes sparkling water and flavored water. Drinking only waterwhile rehydrating can lead to having too little sodium in your body (hyponatremia). Follow instructions from your health care provider. Water from ice chips you suck on. Fruit juice with water you add to it(diluted). Sports drinks. Hot or cold herbal teas. Broth-based soups. Coffee. Milk or milk products. Food Follow instructions from your health care provider about what to eat while you rehydrate. Your health care provider may recommend that you slowly begin eating regular foods in small amounts. Eat foods that contain a healthy balance of electrolytes, such as bananas, oranges, potatoes, tomatoes, and spinach. Avoid foods that are greasy or contain a lot of sugar. In some cases, you may get nutrition through a feeding tube that is passed through your nose and into your stomach (nasogastric tube, or NG tube). This may be done if you have uncontrolled vomiting or diarrhea. Beverages to avoid  Certain beverages may make dehydration worse. While you rehydrate, avoid drinking alcohol. How to tell if you are recovering from dehydration You may be recovering from dehydration if: You are urinating more often than before  you started rehydrating. Your urine is pale yellow. Your energy level improves. You vomit less frequently. You have diarrhea less frequently. Your appetite improves or returns to normal. You feel less  dizzy or less light-headed. Your skin tone and color start to look more normal. Follow these instructions at home: Take over-the-counter and prescription medicines only as told by your health care provider. Do not take sodium tablets. Doing this can lead to having too much sodium in your body (hypernatremia). Contact a health care provider if: You continue to have symptoms of mild or moderate dehydration, such as: Thirst. Dry lips. Slightly dry mouth. Dizziness. Dark urine or less urine than usual. Muscle cramps. You continue to vomit or have diarrhea. Get help right away if you: Have symptoms of dehydration that get worse. Have a fever. Have a severe headache. Have been vomiting and the following happens: Your vomiting gets worse. Your vomit includes blood or green matter (bile). You cannot eat or drink without vomiting. Have problems with urination or bowel movements, such as: Diarrhea that gets worse. Blood in your stool (feces). This may cause stool to look black and tarry. Not urinating, or urinating only a small amount of very dark urine, within 6-8 hours. Have trouble breathing. Have symptoms that get worse with treatment. These symptoms may represent a serious problem that is an emergency. Do not wait to see if the symptoms will go away. Get medical help right away. Call your local emergency services (911 in the [place] Do not drive yourself to the hospital. Summary Rehydration is the replacement of body fluids, salts, and minerals (electrolytes) that are lost during dehydration. Follow instructions from your health care provider for rehydration. The kind of fluid and the amount you should drink depend on your condition. Slowly increase how much you drink until you have taken the amount recommended by your health care provider. Contact your health care provider if you continue to show signs of mild or moderate dehydration. This information is not intended to replace advice  given to you by your health care provider. Make sure you discuss any questions you have with your health care provider. Document Revised: 05/25/2019 Document Reviewed: 05/12/2019 Elsevier Patient Education  [place].

## 2021-11-20 NOTE — Telephone Encounter (Signed)
CRITICAL VALUE STICKER  CRITICAL VALUE: K = 2.1  RECEIVER (on-site recipient of call): Yetta Glassman, [place] NOTIFIED: 11/20/21 at 2:00pm  MESSENGER (representative from lab): [facility]  MD NOTIFIED: Julien Nordmann  TIME OF NOTIFICATION: 11/20/21 at 2:02pm  RESPONSE: Notification provided to Dr. Julien Nordmann and Shauna Hugh, RN for follow-up.

## 2021-11-20 NOTE — Progress Notes (Signed)
Ordered B12 due to c/o neuropathy. His B12 is low. Will give patient the option of B12 injections weekly x4 and monthly there on after vs OTC B12 with [phone] mcg daily. Will arrange for B12 recheck in a few weeks.

## 2021-11-20 NOTE — Progress Notes (Unsigned)
CRITICAL VALUE POTASSIUM: 2.1 CALLED TO, READ BACK, AND VERIFIED WITH ANSYI SILAS @ 0402 ON 8.16.2023 BY Lawana Pai, MLS

## 2021-11-20 NOTE — Progress Notes (Signed)
[facility] [phone]   [phone]  OFFICE PROGRESS NOTE  [name], PA-C [address] [place] [address]  DIAGNOSIS: Stage IV (T3, N1, M1b) non-small cell lung cancer favoring squamous cell carcinoma presented with superior segment left lower lobe lung mass with invasion of the chest wall and destruction of the posterior left sixth and seventh ribs as well as left hilar adenopathy and suspicious pleural metastasis diagnosed in March 2023  PRIOR THERAPY:  1) Palliative radiotherapy to the left lower lobe lung mass with chest wall invasion under the care of Dr. Sondra Come. 2) Palliative systemic chemotherapy with carboplatin for AUC of 5, paclitaxel 175 Mg/M2 and Libtayo (Cempilimab) 350 Mg IV every 3 weeks with Neulasta support.  First dose was given on July 18, 2021.  Status post 4 cycles.  CURRENT THERAPY: Maintenance treatment with Libtayo (Cempilimab) 350 Mg IV every 3 weeks, first dose October 10, 2021 status post 2 cycles.  INTERVAL HISTORY: Edward Beck 76 y.o. male returns to the clinic today for follow-up visit accompanied by his wife.  The patient continues to complain of increasing fatigue and weakness.  He also has lack of appetite and lost several pounds in the last few weeks.  He was treated recently for hypokalemia and completed his potassium supplement few days ago.  He denied having any current chest pain, shortness of breath except with exertion with no cough or hemoptysis.  He has no nausea, vomiting, diarrhea or constipation.  He has no headache or visual changes.  He is here today for evaluation with repeat CT scan of the chest, abdomen and pelvis for restaging of his disease.   MEDICAL HISTORY: Past Medical History:  Diagnosis Date   Condyloma acuminatum of scrotum    COVID 2021   December 2022 - mild   History of radiation therapy    Left Lung- 07/23/21-08/02/21- Dr. Gery Pray   Lung cancer [facility])    Pneumonia     Scrotal lesion     ALLERGIES:  is allergic to tamiflu [oseltamivir phosphate].  MEDICATIONS:  Current Outpatient Medications  Medication Sig Dispense Refill   finasteride (PROSCAR) 5 MG tablet Take 1 tablet (5 mg total) by mouth daily. 90 tablet 3   gabapentin (NEURONTIN) 100 MG capsule Take 1 capsule (100 mg total) by mouth 3 (three) times daily. 90 capsule 2   lidocaine-prilocaine (EMLA) cream Apply to the Port-A-Cath site 30 minutes before chemotherapy. 30 g 0   potassium chloride SA (KLOR-CON M) 20 MEQ tablet Take 1 tablet (20 mEq total) by mouth 2 (two) times daily. 14 tablet 0   prochlorperazine (COMPAZINE) 10 MG tablet Take 1 tablet (10 mg total) by mouth every 6 (six) hours as needed for nausea or vomiting. (Patient not taking: Reported on 09/19/2021) 30 tablet 0   triamcinolone cream (KENALOG) 0.1 % Apply 1 application. topically 2 (two) times daily as needed (dry skin). (Patient not taking: Reported on 09/19/2021)     umeclidinium-vilanterol (ANORO ELLIPTA) 62.5-25 MCG/ACT AEPB Inhale 1 puff into the lungs daily. (Patient not taking: Reported on 09/12/2021) 7 each 0   No current facility-administered medications for this visit.    SURGICAL HISTORY:  Past Surgical History:  Procedure Laterality Date   BRONCHIAL BIOPSY  07/02/2021   Procedure: BRONCHIAL BIOPSIES;  Surgeon: Garner Nash, DO;  Location: [facility];  Service: Pulmonary;;   BRONCHIAL BRUSHINGS  07/02/2021   Procedure: BRONCHIAL BRUSHINGS;  Surgeon: June Leap  L, DO;  Location: [facility];  Service: Pulmonary;;   BRONCHIAL NEEDLE ASPIRATION BIOPSY  07/02/2021   Procedure: BRONCHIAL NEEDLE ASPIRATION BIOPSIES;  Surgeon: Garner Nash, DO;  Location: [facility];  Service: Pulmonary;;   HERNIA REPAIR  2009   left inguinal   IR IMAGING GUIDED PORT INSERTION  07/16/2021   SCROTAL EXPLORATION  11/21/2011   Procedure: SCROTUM EXPLORATION;  Surgeon: Hanley Ben, MD;  Location: [facility];  Service: Urology;   Laterality: N/A;  EXCISION, BIOPSY SCROTAL CONDYLOMA    VIDEO BRONCHOSCOPY WITH ENDOBRONCHIAL ULTRASOUND Bilateral 07/02/2021   Procedure: VIDEO BRONCHOSCOPY WITH ENDOBRONCHIAL ULTRASOUND;  Surgeon: Garner Nash, DO;  Location: [facility];  Service: Pulmonary;  Laterality: Bilateral;    REVIEW OF SYSTEMS:  Constitutional: positive for anorexia, fatigue, and weight loss Eyes: negative Ears, nose, mouth, throat, and face: negative Respiratory: negative Cardiovascular: negative Gastrointestinal: negative Genitourinary:negative Integument/breast: negative Hematologic/lymphatic: negative Musculoskeletal:positive for muscle weakness Neurological: negative Behavioral/Psych: negative Endocrine: negative Allergic/Immunologic: negative   PHYSICAL EXAMINATION: General appearance: alert, cooperative, fatigued, and no distress Head: Normocephalic, without obvious abnormality, atraumatic Neck: no adenopathy, no JVD, supple, symmetrical, trachea midline, and thyroid not enlarged, symmetric, no tenderness/mass/nodules Lymph nodes: Cervical, supraclavicular, and axillary nodes normal. Resp: clear to auscultation bilaterally Back: symmetric, no curvature. ROM normal. No CVA tenderness. Cardio: regular rate and rhythm, S1, S2 normal, no murmur, click, rub or gallop GI: soft, non-tender; bowel sounds normal; no masses,  no organomegaly Extremities: extremities normal, atraumatic, no cyanosis or edema Neurologic: Alert and oriented X 3, normal strength and tone. Normal symmetric reflexes. Normal coordination and gait  ECOG PERFORMANCE STATUS: 1 - Symptomatic but completely ambulatory  Blood pressure 103/62, pulse 76, temperature (!) 97.1 F (36.2 C), temperature source Tympanic, resp. rate 18, weight 138 lb 5 oz (62.7 kg), SpO2 100 %.  LABORATORY DATA: Lab Results  Component Value Date   WBC 9.7 11/20/2021   HGB 11.2 (L) 11/20/2021   HCT 30.8 (L) 11/20/2021   MCV 95.1 11/20/2021   PLT 267  11/20/2021      Chemistry      Component Value Date/Time   NA 139 10/30/2021 1001   K 3.5 10/30/2021 1001   CL 108 10/30/2021 1001   CO2 26 10/30/2021 1001   BUN 13 10/30/2021 1001   CREATININE 0.92 10/30/2021 1001      Component Value Date/Time   CALCIUM 9.8 10/30/2021 1001   ALKPHOS 89 10/30/2021 1001   AST 16 10/30/2021 1001   ALT 10 10/30/2021 1001   BILITOT 0.5 10/30/2021 1001       RADIOGRAPHIC STUDIES: CT Chest W Contrast  Result Date: 11/18/2021 CLINICAL DATA:  Squamous cell carcinoma of the left lung. Restaging. * Tracking Code: BO * EXAM: CT CHEST, ABDOMEN, AND PELVIS WITH CONTRAST TECHNIQUE: Multidetector CT imaging of the chest, abdomen and pelvis was performed following the standard protocol during bolus administration of intravenous contrast. RADIATION DOSE REDUCTION: This exam was performed according to the departmental dose-optimization program which includes automated exposure control, adjustment of the mA and/or kV according to patient size and/or use of iterative reconstruction technique. CONTRAST:  139mL OMNIPAQUE IOHEXOL 300 MG/ML  SOLN COMPARISON:  09/17/2021 FINDINGS: CT CHEST FINDINGS Cardiovascular: The heart size is normal. No substantial pericardial effusion. Mild atherosclerotic calcification is noted in the wall of the thoracic aorta. Right Port-A-Cath tip is positioned in the right atrium near the SVC junction. Mediastinum/Nodes: No mediastinal lymphadenopathy. There is no hilar lymphadenopathy. The esophagus has normal imaging features.  There is no axillary lymphadenopathy. Lungs/Pleura: Centrilobular and paraseptal emphysema evident. Interval development of paramediastinal subpleural reticulation and subpleural consolidative opacity, potentially related to radiation therapy. There is progressive suprahilar and parahilar left lung consolidative disease, presumably secondary to radiation changes secondary to treatment of the superior segment left lower lobe  lung lesion. Rounded atelectasis/scarring posterior left lower lobe with volume loss left hemithorax is similar to prior. Musculoskeletal: Decreased lucency in the posterior left seventh rib may reflect a component of interval healing. CT ABDOMEN PELVIS FINDINGS Hepatobiliary: No suspicious focal abnormality within the liver parenchyma. There is no evidence for gallstones, gallbladder wall thickening, or pericholecystic fluid. No intrahepatic or extrahepatic biliary dilation. Pancreas: No focal mass lesion. No dilatation of the main duct. No intraparenchymal cyst. No peripancreatic edema. Spleen: No splenomegaly. No focal mass lesion. Adrenals/Urinary Tract: No adrenal nodule or mass. 2-3 mm nonobstructing stone noted lower pole right kidney. Tiny cysts again noted right kidney. Left kidney unremarkable. No evidence for hydroureter. The urinary bladder appears normal for the degree of distention. Stomach/Bowel: Stomach is unremarkable. No gastric wall thickening. No evidence of outlet obstruction. Duodenum is normally positioned as is the ligament of Treitz. No small bowel wall thickening. No small bowel dilatation. The terminal ileum is normal. The appendix is normal. No gross colonic mass. No colonic wall thickening. Diverticular changes are noted in the left colon without evidence of diverticulitis. Vascular/Lymphatic: There is moderate atherosclerotic calcification of the abdominal aorta without aneurysm. There is no gastrohepatic or hepatoduodenal ligament lymphadenopathy. No retroperitoneal or mesenteric lymphadenopathy. No pelvic sidewall lymphadenopathy. Reproductive: Prostate gland appears enlarged. Other: No intraperitoneal free fluid. Musculoskeletal: No worrisome lytic or sclerotic osseous abnormality. IMPRESSION: 1. Interval development of paramediastinal subpleural reticulation and subpleural consolidative opacity, presumably related to radiation therapy. There is progressive suprahilar and parahilar  left lung consolidative disease, also likely secondary to radiation changes secondary to treatment of the superior segment left lower lobe lung lesion. Close attention on follow-up recommended given the interval progression of these findings. 2. No evidence for metastatic disease in the abdomen, or pelvis. 3. Nonobstructing right renal stone. 4. Prostatomegaly. 5. Aortic Atherosclerosis (ICD10-I70.0) and Emphysema (ICD10-J43.9). Electronically Signed   By: Misty Stanley M.D.   On: 11/18/2021 08:44   CT Abdomen Pelvis W Contrast  Result Date: 11/18/2021 CLINICAL DATA:  Squamous cell carcinoma of the left lung. Restaging. * Tracking Code: BO * EXAM: CT CHEST, ABDOMEN, AND PELVIS WITH CONTRAST TECHNIQUE: Multidetector CT imaging of the chest, abdomen and pelvis was performed following the standard protocol during bolus administration of intravenous contrast. RADIATION DOSE REDUCTION: This exam was performed according to the departmental dose-optimization program which includes automated exposure control, adjustment of the mA and/or kV according to patient size and/or use of iterative reconstruction technique. CONTRAST:  187mL OMNIPAQUE IOHEXOL 300 MG/ML  SOLN COMPARISON:  09/17/2021 FINDINGS: CT CHEST FINDINGS Cardiovascular: The heart size is normal. No substantial pericardial effusion. Mild atherosclerotic calcification is noted in the wall of the thoracic aorta. Right Port-A-Cath tip is positioned in the right atrium near the SVC junction. Mediastinum/Nodes: No mediastinal lymphadenopathy. There is no hilar lymphadenopathy. The esophagus has normal imaging features. There is no axillary lymphadenopathy. Lungs/Pleura: Centrilobular and paraseptal emphysema evident. Interval development of paramediastinal subpleural reticulation and subpleural consolidative opacity, potentially related to radiation therapy. There is progressive suprahilar and parahilar left lung consolidative disease, presumably secondary to  radiation changes secondary to treatment of the superior segment left lower lobe lung lesion. Rounded atelectasis/scarring posterior left lower  lobe with volume loss left hemithorax is similar to prior. Musculoskeletal: Decreased lucency in the posterior left seventh rib may reflect a component of interval healing. CT ABDOMEN PELVIS FINDINGS Hepatobiliary: No suspicious focal abnormality within the liver parenchyma. There is no evidence for gallstones, gallbladder wall thickening, or pericholecystic fluid. No intrahepatic or extrahepatic biliary dilation. Pancreas: No focal mass lesion. No dilatation of the main duct. No intraparenchymal cyst. No peripancreatic edema. Spleen: No splenomegaly. No focal mass lesion. Adrenals/Urinary Tract: No adrenal nodule or mass. 2-3 mm nonobstructing stone noted lower pole right kidney. Tiny cysts again noted right kidney. Left kidney unremarkable. No evidence for hydroureter. The urinary bladder appears normal for the degree of distention. Stomach/Bowel: Stomach is unremarkable. No gastric wall thickening. No evidence of outlet obstruction. Duodenum is normally positioned as is the ligament of Treitz. No small bowel wall thickening. No small bowel dilatation. The terminal ileum is normal. The appendix is normal. No gross colonic mass. No colonic wall thickening. Diverticular changes are noted in the left colon without evidence of diverticulitis. Vascular/Lymphatic: There is moderate atherosclerotic calcification of the abdominal aorta without aneurysm. There is no gastrohepatic or hepatoduodenal ligament lymphadenopathy. No retroperitoneal or mesenteric lymphadenopathy. No pelvic sidewall lymphadenopathy. Reproductive: Prostate gland appears enlarged. Other: No intraperitoneal free fluid. Musculoskeletal: No worrisome lytic or sclerotic osseous abnormality. IMPRESSION: 1. Interval development of paramediastinal subpleural reticulation and subpleural consolidative opacity,  presumably related to radiation therapy. There is progressive suprahilar and parahilar left lung consolidative disease, also likely secondary to radiation changes secondary to treatment of the superior segment left lower lobe lung lesion. Close attention on follow-up recommended given the interval progression of these findings. 2. No evidence for metastatic disease in the abdomen, or pelvis. 3. Nonobstructing right renal stone. 4. Prostatomegaly. 5. Aortic Atherosclerosis (ICD10-I70.0) and Emphysema (ICD10-J43.9). Electronically Signed   By: Misty Stanley M.D.   On: 11/18/2021 08:44    ASSESSMENT AND PLAN: This is a very pleasant 76 years old white male with Stage IV (T3, N1, M1b) non-small cell lung cancer favoring squamous cell carcinoma presented with superior segment left lower lobe lung mass with invasion of the chest wall and destruction of the posterior left sixth and seventh ribs as well as left hilar adenopathy and suspicious pleural metastasis diagnosed in March 2023 He is currently undergoing palliative radiotherapy to the left lower lobe lung mass with chest wall invasion under the care of Dr. Sondra Come. He is also undergoing systemic chemotherapy with carboplatin for AUC of 5, paclitaxel 175 Mg/M2 and Libtayo (Cempilimab) 350 Mg IV every 3 weeks.  He started the first dose of his treatment on July 18, 2021.  Status post 4 cycles.   The patient is started maintenance treatment with single agent Libtayo (Cempilimab) 350 Mg IV every 3 weeks on October 10, 2021 status post 2 cycles.  He has been tolerating this treatment well except for the increasing fatigue and weakness. He had repeat CT scan of the chest, abdomen and pelvis performed recently.  I personally and independently reviewed the scan and discussed the results with the patient and his wife. His scan showed no concerning finding for disease progression but he has progressive evolving radiation changes of the suprahilar and perihilar left  lung. The patient has been complaining of increasing fatigue and weakness.  I recommended for him to delay the start of cycle #3 of his maintenance therapy by 3 weeks to give him more time to recover. For the dehydration, I will arrange for the  patient to receive 1 L of normal saline in the clinic today. For the hypokalemia I will arrange for him to receive 40 mEq of potassium chloride with the IV fluid today.  I will also send a prescription to his pharmacy for potassium chloride supplement 40 mEq daily for the next 10 days. The patient will come back for follow-up visit in 3 weeks for evaluation before resuming his treatment. He was advised to call immediately if he has any concerning symptoms in the interval. The patient voices understanding of current disease status and treatment options and is in agreement with the current care plan.  All questions were answered. The patient knows to call the clinic with any problems, questions or concerns. We can certainly see the patient much sooner if necessary.  The total time spent in the appointment was 35 minutes.  Disclaimer: This note was dictated with voice recognition software. Similar sounding words can inadvertently be transcribed and may not be corrected upon review.

## 2021-11-29 ENCOUNTER — Emergency Department (HOSPITAL_COMMUNITY): Payer: Medicare Other

## 2021-11-29 ENCOUNTER — Other Ambulatory Visit: Payer: Self-pay

## 2021-11-29 ENCOUNTER — Telehealth: Payer: Self-pay

## 2021-11-29 ENCOUNTER — Emergency Department (HOSPITAL_COMMUNITY)
Admission: EM | Admit: 2021-11-29 | Discharge: 2021-11-29 | Disposition: A | Discharge status: Home / Self Care | Payer: Medicare Other | Attending: Emergency Medicine | Admitting: Emergency Medicine

## 2021-11-29 DIAGNOSIS — R079 Chest pain, unspecified: Secondary | ICD-10-CM | POA: Insufficient documentation

## 2021-11-29 DIAGNOSIS — E876 Hypokalemia: Secondary | ICD-10-CM | POA: Insufficient documentation

## 2021-11-29 DIAGNOSIS — C3432 Malignant neoplasm of lower lobe, left bronchus or lung: Secondary | ICD-10-CM

## 2021-11-29 DIAGNOSIS — R519 Headache, unspecified: Secondary | ICD-10-CM | POA: Diagnosis not present

## 2021-11-29 DIAGNOSIS — J439 Emphysema, unspecified: Secondary | ICD-10-CM | POA: Diagnosis not present

## 2021-11-29 DIAGNOSIS — C342 Malignant neoplasm of middle lobe, bronchus or lung: Secondary | ICD-10-CM | POA: Diagnosis not present

## 2021-11-29 DIAGNOSIS — R531 Weakness: Secondary | ICD-10-CM | POA: Diagnosis not present

## 2021-11-29 DIAGNOSIS — C3490 Malignant neoplasm of unspecified part of unspecified bronchus or lung: Secondary | ICD-10-CM | POA: Diagnosis not present

## 2021-11-29 LAB — URINALYSIS, ROUTINE W REFLEX MICROSCOPIC
Bacteria, UA: NONE SEEN
Bilirubin Urine: NEGATIVE
Glucose, UA: NEGATIVE mg/dL
Hgb urine dipstick: NEGATIVE
Ketones, ur: NEGATIVE mg/dL
Leukocytes,Ua: NEGATIVE
Nitrite: NEGATIVE
Protein, ur: 30 mg/dL — AB
Specific Gravity, Urine: 1.01 (ref 1.005–1.030)
pH: 7 (ref 5.0–8.0)

## 2021-11-29 LAB — CBC WITH DIFFERENTIAL/PLATELET
Abs Immature Granulocytes: 0.08 10*3/uL — ABNORMAL HIGH (ref 0.00–0.07)
Basophils Absolute: 0.1 10*3/uL (ref 0.0–0.1)
Basophils Relative: 1 %
Eosinophils Absolute: 0.1 10*3/uL (ref 0.0–0.5)
Eosinophils Relative: 1 %
HCT: 30.4 % — ABNORMAL LOW (ref 39.0–52.0)
Hemoglobin: 10.4 g/dL — ABNORMAL LOW (ref 13.0–17.0)
Immature Granulocytes: 1 %
Lymphocytes Relative: 6 %
Lymphs Abs: 0.4 10*3/uL — ABNORMAL LOW (ref 0.7–4.0)
MCH: 33.2 pg (ref 26.0–34.0)
MCHC: 34.2 g/dL (ref 30.0–36.0)
MCV: 97.1 fL (ref 80.0–100.0)
Monocytes Absolute: 0.7 10*3/uL (ref 0.1–1.0)
Monocytes Relative: 9 %
Neutro Abs: 6.1 10*3/uL (ref 1.7–7.7)
Neutrophils Relative %: 82 %
Platelets: 225 10*3/uL (ref 150–400)
RBC: 3.13 MIL/uL — ABNORMAL LOW (ref 4.22–5.81)
RDW: 13 % (ref 11.5–15.5)
WBC: 7.4 10*3/uL (ref 4.0–10.5)
nRBC: 0 % (ref 0.0–0.2)

## 2021-11-29 LAB — COMPREHENSIVE METABOLIC PANEL
ALT: 15 U/L (ref 0–44)
AST: 16 U/L (ref 15–41)
Albumin: 2.4 g/dL — ABNORMAL LOW (ref 3.5–5.0)
Alkaline Phosphatase: 75 U/L (ref 38–126)
Anion gap: 4 — ABNORMAL LOW (ref 5–15)
BUN: 28 mg/dL — ABNORMAL HIGH (ref 8–23)
CO2: 18 mmol/L — ABNORMAL LOW (ref 22–32)
Calcium: 11.4 mg/dL — ABNORMAL HIGH (ref 8.9–10.3)
Chloride: 114 mmol/L — ABNORMAL HIGH (ref 98–111)
Creatinine, Ser: 1.17 mg/dL (ref 0.61–1.24)
GFR, Estimated: >60 mL/min (ref >60)
Glucose, Bld: 109 mg/dL — ABNORMAL HIGH (ref 70–99)
Potassium: 2.8 mmol/L — ABNORMAL LOW (ref 3.5–5.1)
Sodium: 136 mmol/L (ref 135–145)
Total Bilirubin: 0.6 mg/dL (ref 0.3–1.2)
Total Protein: 6.6 g/dL (ref 6.5–8.1)

## 2021-11-29 LAB — CBG MONITORING, ED: Glucose-Capillary: 108 mg/dL — ABNORMAL HIGH (ref 70–99)

## 2021-11-29 LAB — TROPONIN I (HIGH SENSITIVITY)
Troponin I (High Sensitivity): 7 ng/L (ref <18)
Troponin I (High Sensitivity): 7 ng/L (ref <18)

## 2021-11-29 MED ORDER — SODIUM CHLORIDE 0.9 % IV BOLUS
500.0000 mL | Freq: Once | INTRAVENOUS | Status: AC
  Administered 2021-11-29: 500 mL via INTRAVENOUS

## 2021-11-29 MED ORDER — POTASSIUM CHLORIDE CRYS ER 20 MEQ PO TBCR
40.0000 meq | EXTENDED_RELEASE_TABLET | Freq: Once | ORAL | Status: AC
  Administered 2021-11-29: 40 meq via ORAL
  Filled 2021-11-29: qty 2

## 2021-11-29 MED ORDER — HEPARIN SOD (PORK) LOCK FLUSH 100 UNIT/ML IV SOLN
500.0000 [IU] | Freq: Once | INTRAVENOUS | Status: AC
  Administered 2021-11-29: 500 [IU]
  Filled 2021-11-29: qty 5

## 2021-11-29 MED ORDER — POTASSIUM CHLORIDE 10 MEQ/100ML IV SOLN
10.0000 meq | INTRAVENOUS | Status: AC
  Administered 2021-11-29 (×2): 10 meq via INTRAVENOUS
  Filled 2021-11-29 (×2): qty 100

## 2021-11-29 MED ORDER — POTASSIUM CHLORIDE 10 MEQ/100ML IV SOLN
10.0000 meq | INTRAVENOUS | Status: AC
  Administered 2021-11-29: 10 meq via INTRAVENOUS
  Filled 2021-11-29: qty 100

## 2021-11-29 MED ORDER — SODIUM CHLORIDE 0.9 % IV SOLN: INTRAVENOUS | Status: DC

## 2021-11-29 MED ORDER — POTASSIUM CHLORIDE CRYS ER 20 MEQ PO TBCR: 20.0000 meq | EXTENDED_RELEASE_TABLET | Freq: Every day | ORAL | 0 refills | Status: DC

## 2021-11-29 MED ORDER — IOHEXOL 300 MG/ML  SOLN
75.0000 mL | Freq: Once | INTRAMUSCULAR | Status: AC | PRN
  Administered 2021-11-29: 75 mL via INTRAVENOUS

## 2021-11-29 MED ORDER — POTASSIUM CHLORIDE 10 MEQ/100ML IV SOLN: 10.0000 meq | Freq: Once | INTRAVENOUS | Status: DC

## 2021-11-29 NOTE — ED Provider Notes (Signed)
[facility] EMERGENCY DEPARTMENT Provider Note   CSN: [id number] Arrival date & time: 11/29/21  1525     History  Chief Complaint  Patient presents with   Chest Pain   dry mouth   Weakness    Edward Beck is a 76 y.o. male.  Patient presenting with about 1 week history of dry mouth generalized weakness decreased appetite some intermittent chest pain lightheadedness that his wife thinks his gait is off.  Past medical history significant for lung cancer.  Patient's everyday smoker.  1 pack a day.  Patient currently being followed by [facility] [place] by Dr. Earlie Server.  Patient was diagnosed with nonsmall cell lung CA in March 2023.  Underwent palliative radiotherapy to the left lower lobe lung mass which had involved chest wall invasion.  Also underwent palliative systemic chemotherapy which started to April 13 for 4 cycles.  Currently he is in the treatment with Libtayo immunotherapy 300 mg IV every 3 weeks.  First dose was July 6 status post 2 cycles.  Patient apparently seen about a week ago and and was received some IV fluids.  Also had some low potassium.  Patient denies any new or worse shortness of breath.  The chest pain has been present for a while with somewhat intermittent may be musculoskeletal or chest wall in nature.       Home Medications Prior to Admission medications   Medication Sig Start Date End Date Taking? Authorizing Provider  potassium chloride SA (KLOR-CON M) 20 MEQ tablet Take 1 tablet (20 mEq total) by mouth daily. 11/29/21  Yes Fredia Sorrow, MD  finasteride (PROSCAR) 5 MG tablet Take 1 tablet (5 mg total) by mouth daily. 06/26/21   McKenzie, Candee Furbish, MD  gabapentin (NEURONTIN) 100 MG capsule Take 1 capsule (100 mg total) by mouth 3 (three) times daily. 10/30/21   Heilingoetter, Cassandra L, PA-C  lidocaine-prilocaine (EMLA) cream Apply to the Port-A-Cath site 30 minutes before chemotherapy. 07/11/21   Curt Bears, MD  potassium chloride SA  (KLOR-CON M) 20 MEQ tablet Take 1 tablet (20 mEq total) by mouth 2 (two) times daily. 11/20/21   Curt Bears, MD  prochlorperazine (COMPAZINE) 10 MG tablet Take 1 tablet (10 mg total) by mouth every 6 (six) hours as needed for nausea or vomiting. Patient not taking: Reported on 09/19/2021 07/11/21   Curt Bears, MD  triamcinolone cream (KENALOG) 0.1 % Apply 1 application. topically 2 (two) times daily as needed (dry skin). Patient not taking: Reported on 09/19/2021    [provider]  umeclidinium-vilanterol (ANORO ELLIPTA) 62.5-25 MCG/ACT AEPB Inhale 1 puff into the lungs daily. Patient not taking: Reported on 09/12/2021 07/10/21   Garner Nash, DO      Allergies    Tamiflu [oseltamivir phosphate]    Review of Systems   Review of Systems  Constitutional:  Positive for appetite change and fatigue. Negative for chills and fever.  HENT:  Negative for ear pain and sore throat.   Eyes:  Negative for pain and visual disturbance.  Respiratory:  Negative for cough and shortness of breath.   Cardiovascular:  Negative for chest pain, palpitations and leg swelling.  Gastrointestinal:  Negative for abdominal pain and vomiting.  Genitourinary:  Negative for dysuria and hematuria.  Musculoskeletal:  Negative for arthralgias and back pain.  Skin:  Negative for color change and rash.  Neurological:  Positive for weakness and light-headedness. Negative for seizures and syncope.  All other systems reviewed and are negative.  Physical Exam Updated Vital Signs BP 121/74   Pulse 63   Temp 97.8 F (36.6 C) (Oral)   Resp (!) 21   Ht 1.854 m (6\' 1" )   Wt 62.6 kg   SpO2 98%   BMI 18.21 kg/m  Physical Exam Vitals and nursing note reviewed.  Constitutional:      General: He is not in acute distress.    Appearance: Normal appearance. He is well-developed. He is ill-appearing.  HENT:     Head: Normocephalic and atraumatic.     Mouth/Throat:     Mouth: Mucous membranes are dry.   Eyes:     Extraocular Movements: Extraocular movements intact.     Conjunctiva/sclera: Conjunctivae normal.     Pupils: Pupils are equal, round, and reactive to light.  Cardiovascular:     Rate and Rhythm: Normal rate and regular rhythm.     Heart sounds: No murmur heard. Pulmonary:     Effort: Pulmonary effort is normal. No respiratory distress.     Breath sounds: Normal breath sounds.     Comments: Port-A-Cath right anterior chest Abdominal:     Palpations: Abdomen is soft.     Tenderness: There is no abdominal tenderness.  Musculoskeletal:        General: No swelling.     Cervical back: Normal range of motion and neck supple.     Right lower leg: No edema.     Left lower leg: No edema.  Skin:    General: Skin is warm and dry.     Capillary Refill: Capillary refill takes less than 2 seconds.  Neurological:     General: No focal deficit present.     Mental Status: He is alert and oriented to person, place, and time.  Psychiatric:        Mood and Affect: Mood normal.     ED Results / Procedures / Treatments   Labs (all labs ordered are listed, but only abnormal results are displayed) Labs Reviewed  URINALYSIS, ROUTINE W REFLEX MICROSCOPIC - Abnormal; Notable for the following components:      Result Value   Protein, ur 30 (*)    All other components within normal limits  COMPREHENSIVE METABOLIC PANEL - Abnormal; Notable for the following components:   Potassium 2.8 (*)    Chloride 114 (*)    CO2 18 (*)    Glucose, Bld 109 (*)    BUN 28 (*)    Calcium 11.4 (*)    Albumin 2.4 (*)    Anion gap 4 (*)    All other components within normal limits  CBC WITH DIFFERENTIAL/PLATELET - Abnormal; Notable for the following components:   RBC 3.13 (*)    Hemoglobin 10.4 (*)    HCT 30.4 (*)    Lymphs Abs 0.4 (*)    Abs Immature Granulocytes 0.08 (*)    All other components within normal limits  CBG MONITORING, ED - Abnormal; Notable for the following components:    Glucose-Capillary 108 (*)    All other components within normal limits  TROPONIN I (HIGH SENSITIVITY)  TROPONIN I (HIGH SENSITIVITY)    EKG EKG Interpretation  Date/Time:  Friday November 29 2021 15:46:16 EDT Ventricular Rate:  69 PR Interval:  160 QRS Duration: 98 QT Interval:  367 QTC Calculation: 394 R Axis:   88 Text Interpretation: Sinus rhythm Left atrial enlargement Borderline right axis deviation Left ventricular hypertrophy Anterior Q waves, possibly due to LVH No previous ECGs available Confirmed by Rogene Houston,  Darreld Hoffer [phone]) on 11/29/2021 4:34:39 PM  Radiology CT Head Wo Contrast  Result Date: 11/29/2021 CLINICAL DATA:  Stage IV metastatic non-small cell lung cancer with chest pain and generalized weakness. EXAM: CT HEAD WITHOUT CONTRAST TECHNIQUE: Contiguous axial images were obtained from the base of the skull through the vertex without intravenous contrast. RADIATION DOSE REDUCTION: This exam was performed according to the departmental dose-optimization program which includes automated exposure control, adjustment of the mA and/or kV according to patient size and/or use of iterative reconstruction technique. COMPARISON:  None Available. FINDINGS: Brain: There is mild cerebral atrophy with widening of the extra-axial spaces and ventricular dilatation. There are areas of decreased attenuation within the white matter tracts of the supratentorial brain, consistent with microvascular disease changes. Vascular: No hyperdense vessel or unexpected calcification. Skull: Normal. Negative for fracture or focal lesion. Sinuses/Orbits: No acute finding. Other: None. IMPRESSION: 1. No acute intracranial pathology. 2. Generalized cerebral atrophy with microvascular disease changes within the white matter tracts of the supratentorial brain. Electronically Signed   By: Virgina Norfolk M.D.   On: 11/29/2021 19:00   CT Chest W Contrast  Result Date: 11/29/2021 CLINICAL DATA:  History of non-small  cell lung cancer presenting with chest pain and generalized weakness. EXAM: CT CHEST WITH CONTRAST TECHNIQUE: Multidetector CT imaging of the chest was performed during intravenous contrast administration. RADIATION DOSE REDUCTION: This exam was performed according to the departmental dose-optimization program which includes automated exposure control, adjustment of the mA and/or kV according to patient size and/or use of iterative reconstruction technique. CONTRAST:  75mL OMNIPAQUE IOHEXOL 300 MG/ML  SOLN COMPARISON:  November 16, 2021 FINDINGS: Cardiovascular: A right-sided venous Port-A-Cath is in place. There is moderate severity calcification of the thoracic aorta, without evidence of aortic aneurysm or dissection. Normal heart size with mild to moderate severity coronary artery calcification. No pericardial effusion. Mediastinum/Nodes: Multiple subcentimeter AP window, paratracheal and subcarinal lymph nodes are seen. Thyroid gland, trachea, and esophagus demonstrate no significant findings. Lungs/Pleura: Centrilobular and paraseptal emphysematous lung disease is noted. Marked severity left upper lobe and moderate severity adjacent left lower lobe consolidative disease is seen. This is increased in severity when compared to the prior study. Mild, similar appearing changes are present along the anteromedial aspect of the right upper lobe. Left-sided volume loss is noted. Mild lingular, posterior left lower lobe and lateral right basilar scarring and/or atelectasis is seen. There is no evidence of a pleural effusion or pneumothorax. Upper Abdomen: A 1.6 cm simple cyst is seen within the posterior aspect of the mid to upper right kidney. Musculoskeletal: Multilevel degenerative changes seen throughout the thoracic spine. IMPRESSION: 1. Worsening marked severity left upper lobe and moderate severity left lower lobe consolidative disease and associated left-sided volume loss. While this is likely, in part, post  treatment related, a superimposed infectious component cannot be excluded. 2. Mild lingular, posterior left lower lobe and lateral right basilar scarring and/or atelectasis. 3. Centrilobular and paraseptal emphysematous lung disease. 4. 1.6 cm simple cyst within the right kidney (Bosniak 1). No additional follow-up imaging is recommended. This recommendation follows ACR consensus guidelines: Management of the Incidental Renal Mass on CT: A White Paper of the ACR Incidental Findings Committee. J Am Coll Radiol [id number]. Aortic Atherosclerosis (ICD10-I70.0) and Emphysema (ICD10-J43.9). Electronically Signed   By: Virgina Norfolk M.D.   On: 11/29/2021 18:59   DG Chest Port 1 View  Result Date: 11/29/2021 CLINICAL DATA:  Chest pain lung cancer EXAM: PORTABLE CHEST 1 VIEW COMPARISON:  07/02/2021,  CT 11/16/2021 FINDINGS: Right-sided central venous port tip at the cavoatrial region. Emphysema. Volume loss on the left with shift of mediastinal contents to the left. Heterogeneous opacity in the left mid lung, corresponding to the irregular consolidations noted on recent chest CT imaging. Mild left apical pleural thickening. Normal cardiac size. No pneumothorax. IMPRESSION: 1. Heterogeneous airspace disease in the left mid lung, slightly progressive as compared with scout image from recent chest CT 11/16/2021, possible radiation changes but difficult to exclude superimposed acute infectious or inflammatory process. See chest CT report from 11/16/2021. 2. Emphysema Electronically Signed   By: Donavan Foil M.D.   On: 11/29/2021 16:16    Procedures Procedures    Medications Ordered in ED Medications  0.9 %  sodium chloride infusion (0 mLs Intravenous Stopped 11/29/21 2258)  potassium chloride 10 mEq in 100 mL IVPB (0 mEq Intravenous Stopped 11/29/21 2258)  potassium chloride SA (KLOR-CON M) CR tablet 40 mEq (has no administration in time range)  sodium chloride 0.9 % bolus 500 mL (0 mLs Intravenous Stopped  11/29/21 2129)  potassium chloride 10 mEq in 100 mL IVPB (0 mEq Intravenous Stopped 11/29/21 2129)  iohexol (OMNIPAQUE) 300 MG/ML solution 75 mL (75 mLs Intravenous Contrast Given 11/29/21 1808)    ED Course/ Medical Decision Making/ A&P                           Medical Decision Making Amount and/or Complexity of Data Reviewed Labs: ordered. Radiology: ordered.  Risk Prescription drug management.   We will do a broad work-up electrolytes.  CBC was reassuring white count 7.3 hemoglobin 10.4 platelets 225.  Blood sugar 108.  Regular chest x-ray heterogeneous airspace disease in the left midlung slightly progressive compared to scout image from recent CT chest August 12 possible radiation changes difficult to exclude superimposed acute infectious or inflammatory process.  Also has emphysema.  Will probably need to do CT chest again and may consider doing CT head as well once we know his electrolytes.  Patient's electrolytes troponin normal at 7 urinalysis negative complete metabolic panel does show potassium to be 2.8.  Renal function was normal with GFR greater than 60.  CBC no leukocytosis hemoglobin 10.4.  Patient CT head without any acute findings.  CT chest was pretty much baseline from previous findings.  And patient not acting like there is any evidence of pneumonia.  Patient felt better after IV fluids.  For the potassium deficit and patient just received potassium just on August 16.  Today is a little bit better than it was at that time it was 2.1.  Patient will receive 3 rounds of 10 mEq of potassium IV and then 40 milliequivalents p.o.  Patient does have potassium supplementation at home.  He has 2 more doses to take and then I will have him take just 20 mg equivalents a day for the next 7 days.  Have him follow-up with hematology oncology next week for recheck of the potassium.    CRITICAL CARE Performed by: Fredia Sorrow Total critical care time: 40 minutes Critical care time was  exclusive of separately billable procedures and treating other patients. Critical care was necessary to treat or prevent imminent or life-threatening deterioration. Critical care was time spent personally by me on the following activities: development of treatment plan with patient and/or surrogate as well as nursing, discussions with consultants, evaluation of patient's response to treatment, examination of patient, obtaining history from patient or surrogate,  ordering and performing treatments and interventions, ordering and review of laboratory studies, ordering and review of radiographic studies, pulse oximetry and re-evaluation of patient's condition.    Final Clinical Impression(s) / ED Diagnoses Final diagnoses:  Malignant neoplasm of lower lobe of left lung ([place])  Hypokalemia    Rx / DC Orders ED Discharge Orders          Ordered    potassium chloride SA (KLOR-CON M) 20 MEQ tablet  Daily        11/29/21 2257              Fredia Sorrow, MD 11/29/21 3382    Fredia Sorrow, MD 11/30/21 1652

## 2021-11-29 NOTE — Discharge Instructions (Signed)
Take the new potassium once you finish your other potassium take just 20 mill equivalents once a day for 7 days.  Make an appointment follow-up with your hematologist oncologist for next week to have your potassium rechecked since it was low here today.  Other labs and CAT scan of the chest and CAT scan of the head without any significantly new findings.  As you know you also received IV hydration here.  Return for any new or worse symptoms.

## 2021-11-29 NOTE — Telephone Encounter (Signed)
Pts wife called stating pt "is having issues" and requests a call back.  I have called her back and she states pt is experiencing CP, increased SOB, is not balanced on his feet, abdominal pains and not eating or drinking.   I have advised that pt needs to go to the nearest ER as soon as possible. Pt is reluctant to go but he has again been advised it could be life threatening if he does not go given his hx. Pt and his wife expressed understanding of this information.

## 2021-11-29 NOTE — ED Triage Notes (Signed)
Pt to ED via POV c/o chest pain, generalized weakness. Dry mouth x 2 weeks. Progressively getting worse. Pt stage 4 lung cancer, currently receiving treatment.

## 2021-12-02 ENCOUNTER — Other Ambulatory Visit: Payer: Self-pay | Admitting: Physician Assistant

## 2021-12-02 DIAGNOSIS — R630 Anorexia: Secondary | ICD-10-CM

## 2021-12-02 MED ORDER — MIRTAZAPINE 15 MG PO TABS: 15.0000 mg | ORAL_TABLET | Freq: Every day | ORAL | 2 refills | Status: DC

## 2021-12-02 NOTE — Addendum Note (Signed)
Addended by: Claretha Cooper on: 12/02/2021 03:01 PM   Modules accepted: Orders

## 2021-12-02 NOTE — Telephone Encounter (Signed)
Pts wife LVM stating the ER advised them to follow-up with Oncology regarding the pts Hypokalemia. Per Dr. Julien Nordmann, this needs to be monitored and addressed by pts PCP as his his tx with Libtayo would not cause this.  K rich foods were reviewed with pts wife. She also requested something to stimulate his appetite. Cassandra, PA-C has sent a rx of Remeron to confirmed pharmacy on file. Pts wife has been made aware.

## 2021-12-05 DIAGNOSIS — E7849 Other hyperlipidemia: Secondary | ICD-10-CM | POA: Diagnosis not present

## 2021-12-05 DIAGNOSIS — R030 Elevated blood-pressure reading, without diagnosis of hypertension: Secondary | ICD-10-CM | POA: Diagnosis not present

## 2021-12-05 DIAGNOSIS — N401 Enlarged prostate with lower urinary tract symptoms: Secondary | ICD-10-CM | POA: Diagnosis not present

## 2021-12-05 DIAGNOSIS — Z720 Tobacco use: Secondary | ICD-10-CM | POA: Diagnosis not present

## 2021-12-05 DIAGNOSIS — Z681 Body mass index (BMI) 19 or less, adult: Secondary | ICD-10-CM | POA: Diagnosis not present

## 2021-12-05 DIAGNOSIS — C3432 Malignant neoplasm of lower lobe, left bronchus or lung: Secondary | ICD-10-CM | POA: Diagnosis not present

## 2021-12-05 DIAGNOSIS — E876 Hypokalemia: Secondary | ICD-10-CM | POA: Diagnosis not present

## 2021-12-05 DIAGNOSIS — R5383 Other fatigue: Secondary | ICD-10-CM | POA: Diagnosis not present

## 2021-12-11 ENCOUNTER — Other Ambulatory Visit: Payer: Self-pay | Admitting: Internal Medicine

## 2021-12-11 ENCOUNTER — Inpatient Hospital Stay: Payer: Medicare Other

## 2021-12-11 ENCOUNTER — Other Ambulatory Visit: Payer: Self-pay

## 2021-12-11 ENCOUNTER — Inpatient Hospital Stay: Payer: Medicare Other | Attending: Internal Medicine

## 2021-12-11 ENCOUNTER — Inpatient Hospital Stay (HOSPITAL_BASED_OUTPATIENT_CLINIC_OR_DEPARTMENT_OTHER): Payer: Medicare Other | Admitting: Internal Medicine

## 2021-12-11 VITALS — BP 95/68 | HR 102 | Temp 97.7°F | Resp 5 | Wt 127.5 lb

## 2021-12-11 DIAGNOSIS — E876 Hypokalemia: Secondary | ICD-10-CM | POA: Insufficient documentation

## 2021-12-11 DIAGNOSIS — C3432 Malignant neoplasm of lower lobe, left bronchus or lung: Secondary | ICD-10-CM | POA: Insufficient documentation

## 2021-12-11 DIAGNOSIS — C7989 Secondary malignant neoplasm of other specified sites: Secondary | ICD-10-CM | POA: Diagnosis not present

## 2021-12-11 DIAGNOSIS — C3492 Malignant neoplasm of unspecified part of left bronchus or lung: Secondary | ICD-10-CM

## 2021-12-11 DIAGNOSIS — Z5112 Encounter for antineoplastic immunotherapy: Secondary | ICD-10-CM | POA: Insufficient documentation

## 2021-12-11 DIAGNOSIS — E860 Dehydration: Secondary | ICD-10-CM

## 2021-12-11 DIAGNOSIS — Z79899 Other long term (current) drug therapy: Secondary | ICD-10-CM | POA: Diagnosis not present

## 2021-12-11 DIAGNOSIS — Z95828 Presence of other vascular implants and grafts: Secondary | ICD-10-CM

## 2021-12-11 LAB — CMP (CANCER CENTER ONLY)
ALT: 8 U/L (ref 0–44)
AST: 15 U/L (ref 15–41)
Albumin: 3.3 g/dL — ABNORMAL LOW (ref 3.5–5.0)
Alkaline Phosphatase: 81 U/L (ref 38–126)
Anion gap: 5 (ref 5–15)
BUN: 26 mg/dL — ABNORMAL HIGH (ref 8–23)
CO2: 20 mmol/L — ABNORMAL LOW (ref 22–32)
Calcium: 12.5 mg/dL — ABNORMAL HIGH (ref 8.9–10.3)
Chloride: 111 mmol/L (ref 98–111)
Creatinine: 1.48 mg/dL — ABNORMAL HIGH (ref 0.61–1.24)
GFR, Estimated: 48 mL/min — ABNORMAL LOW (ref >60)
Glucose, Bld: 108 mg/dL — ABNORMAL HIGH (ref 70–99)
Potassium: 2.8 mmol/L — ABNORMAL LOW (ref 3.5–5.1)
Sodium: 136 mmol/L (ref 135–145)
Total Bilirubin: 0.3 mg/dL (ref 0.3–1.2)
Total Protein: 7.6 g/dL (ref 6.5–8.1)

## 2021-12-11 LAB — CBC WITH DIFFERENTIAL (CANCER CENTER ONLY)
Abs Immature Granulocytes: 0.07 10*3/uL (ref 0.00–0.07)
Basophils Absolute: 0.1 10*3/uL (ref 0.0–0.1)
Basophils Relative: 1 %
Eosinophils Absolute: 0.1 10*3/uL (ref 0.0–0.5)
Eosinophils Relative: 2 %
HCT: 34 % — ABNORMAL LOW (ref 39.0–52.0)
Hemoglobin: 11.7 g/dL — ABNORMAL LOW (ref 13.0–17.0)
Immature Granulocytes: 1 %
Lymphocytes Relative: 7 %
Lymphs Abs: 0.4 10*3/uL — ABNORMAL LOW (ref 0.7–4.0)
MCH: 31.9 pg (ref 26.0–34.0)
MCHC: 34.4 g/dL (ref 30.0–36.0)
MCV: 92.6 fL (ref 80.0–100.0)
Monocytes Absolute: 0.5 10*3/uL (ref 0.1–1.0)
Monocytes Relative: 9 %
Neutro Abs: 4.8 10*3/uL (ref 1.7–7.7)
Neutrophils Relative %: 80 %
Platelet Count: 234 10*3/uL (ref 150–400)
RBC: 3.67 MIL/uL — ABNORMAL LOW (ref 4.22–5.81)
RDW: 12.9 % (ref 11.5–15.5)
WBC Count: 6 10*3/uL (ref 4.0–10.5)
nRBC: 0 % (ref 0.0–0.2)

## 2021-12-11 LAB — TSH: TSH: 1.956 u[IU]/mL (ref 0.350–4.500)

## 2021-12-11 LAB — MAGNESIUM: Magnesium: 2.2 mg/dL (ref 1.7–2.4)

## 2021-12-11 MED ORDER — POTASSIUM CHLORIDE CRYS ER 20 MEQ PO TBCR: 20.0000 meq | EXTENDED_RELEASE_TABLET | Freq: Two times a day (BID) | ORAL | 0 refills | Status: DC

## 2021-12-11 MED ORDER — SODIUM CHLORIDE 0.9% FLUSH
10.0000 mL | Freq: Once | INTRAVENOUS | Status: AC
  Administered 2021-12-11: 10 mL

## 2021-12-11 MED ORDER — PREDNISONE 10 MG PO TABS: ORAL_TABLET | ORAL | 0 refills | Status: DC

## 2021-12-11 MED ORDER — SODIUM CHLORIDE 0.9 % IV SOLN: INTRAVENOUS | Status: AC

## 2021-12-11 MED ORDER — POTASSIUM CHLORIDE 10 MEQ/100ML IV SOLN
10.0000 meq | INTRAVENOUS | Status: AC
  Administered 2021-12-11 (×2): 10 meq via INTRAVENOUS
  Filled 2021-12-11 (×2): qty 100

## 2021-12-11 NOTE — Patient Instructions (Signed)
Rehydration, Elderly Rehydration is the replacement of body fluids, salts, and minerals (electrolytes) that are lost during dehydration. Dehydration is when there is not enough water or other fluids in the body. This happens when you lose more fluids than you take in. People who are age 78 or older have a higher risk of dehydration than younger adults. Common causes of dehydration include: Conditions that cause loss of water or other fluids, such as diarrhea, vomiting, sweating, or urinating a lot. Not drinking enough fluids. This can occur when you are ill or doing activities that require a lot of energy, especially in hot weather. Other illnesses and conditions, such as fever or infection. Certain medicines, such as those that remove excess fluid from the body (diuretics). Not being able to get enough water and food. Symptoms of mild or moderate dehydration may include thirst, dry lips and mouth, and dizziness. Symptoms of severe dehydration may include increased heart rate, confusion, fainting, and not urinating. For severe dehydration, you may need to get fluids through an IV at the hospital. For mild or moderate dehydration, you can usually rehydrate at home by drinking certain fluids as told by your health care provider. What are the risks? Generally, rehydration is safe. However, taking in too much fluid (overhydration) can be a problem. This is rare. Overhydration can cause an electrolyte imbalance, kidney failure, fluid in the lungs, or a decrease in salt (sodium) levels in the body. Supplies needed: You will need an oral rehydration solution (ORS) if your health care provider tells you to use one. This is a drink designed to treat dehydration. It can be found in pharmacies and retail stores. How to rehydrate Fluids Follow instructions from your health care provider for rehydration. The kind of fluid and the amount you should drink depend on your condition. In general, for mild dehydration,  you should choose drinks that you prefer. If told by your health care provider, drink an ORS. Make an ORS by following instructions on the package. Start by drinking small amounts, about  cup (120 mL) every 5-10 minutes. Slowly increase how much you drink until you have taken the amount recommended by your health care provider. Drink enough fluids to keep your urine pale yellow. If you were told to drink an ORS, finish the ORS first, then start slowly drinking other clear fluids. Drink fluids such as: Water. This includes sparkling water and flavored water. Drinking only waterwhile rehydrating can lead to having too little sodium in your body (hyponatremia). Follow instructions from your health care provider. Water from ice chips you suck on. Fruit juice with water you add to it(diluted). Sports drinks. Hot or cold herbal teas. Broth-based soups. Coffee. Milk or milk products. Food Follow instructions from your health care provider about what to eat while you rehydrate. Your health care provider may recommend that you slowly begin eating regular foods in small amounts. Eat foods that contain a healthy balance of electrolytes, such as bananas, oranges, potatoes, tomatoes, and spinach. Avoid foods that are greasy or contain a lot of sugar. In some cases, you may get nutrition through a feeding tube that is passed through your nose and into your stomach (nasogastric tube, or NG tube). This may be done if you have uncontrolled vomiting or diarrhea. Beverages to avoid  Certain beverages may make dehydration worse. While you rehydrate, avoid drinking alcohol. How to tell if you are recovering from dehydration You may be recovering from dehydration if: You are urinating more often than before  you started rehydrating. Your urine is pale yellow. Your energy level improves. You vomit less frequently. You have diarrhea less frequently. Your appetite improves or returns to normal. You feel less  dizzy or less light-headed. Your skin tone and color start to look more normal. Follow these instructions at home: Take over-the-counter and prescription medicines only as told by your health care provider. Do not take sodium tablets. Doing this can lead to having too much sodium in your body (hypernatremia). Contact a health care provider if: You continue to have symptoms of mild or moderate dehydration, such as: Thirst. Dry lips. Slightly dry mouth. Dizziness. Dark urine or less urine than usual. Muscle cramps. You continue to vomit or have diarrhea. Get help right away if you: Have symptoms of dehydration that get worse. Have a fever. Have a severe headache. Have been vomiting and the following happens: Your vomiting gets worse. Your vomit includes blood or green matter (bile). You cannot eat or drink without vomiting. Have problems with urination or bowel movements, such as: Diarrhea that gets worse. Blood in your stool (feces). This may cause stool to look black and tarry. Not urinating, or urinating only a small amount of very dark urine, within 6-8 hours. Have trouble breathing. Have symptoms that get worse with treatment. These symptoms may represent a serious problem that is an emergency. Do not wait to see if the symptoms will go away. Get medical help right away. Call your local emergency services (911 in the [place] Do not drive yourself to the hospital. Summary Rehydration is the replacement of body fluids, salts, and minerals (electrolytes) that are lost during dehydration. Follow instructions from your health care provider for rehydration. The kind of fluid and the amount you should drink depend on your condition. Slowly increase how much you drink until you have taken the amount recommended by your health care provider. Contact your health care provider if you continue to show signs of mild or moderate dehydration. This information is not intended to replace advice  given to you by your health care provider. Make sure you discuss any questions you have with your health care provider. Document Revised: 05/25/2019 Document Reviewed: 05/12/2019 [employer] Patient Education  [place].  Hypokalemia Hypokalemia means that the amount of potassium in the blood is lower than normal. Potassium is a mineral (electrolyte) that helps regulate the amount of fluid in the body. It also stimulates muscle tightening (contraction) and helps nerves work properly. Normally, most of the body's potassium is inside cells, and only a very small amount is in the blood. Because the amount in the blood is so small, minor changes to potassium levels in the blood can be life-threatening. What are the causes? This condition may be caused by: Antibiotic medicine. Diarrhea or vomiting. Taking too much of a medicine that helps you have a bowel movement (laxative) can cause diarrhea and lead to hypokalemia. Chronic kidney disease (CKD). Medicines that help the body get rid of excess fluid (diuretics). Eating disorders, such as anorexia or bulimia. Low magnesium levels in the body. Sweating a lot. What are the signs or symptoms? Symptoms of this condition include: Weakness. Constipation. Fatigue. Muscle cramps. Mental confusion. Skipped heartbeats or irregular heartbeat (palpitations). Tingling or numbness. How is this diagnosed? This condition is diagnosed with a blood test. How is this treated? This condition may be treated by: Taking potassium supplements. Adjusting the medicines that you take. Eating more foods that contain a lot of potassium. If your potassium level  is very low, you may need to get potassium through an IV and be monitored in the hospital. Follow these instructions at home: Eating and drinking  Eat a healthy diet. A healthy diet includes fresh fruits and vegetables, whole grains, healthy fats, and lean proteins. If told, eat more foods that contain  a lot of potassium. These include: Nuts, such as peanuts and pistachios. Seeds, such as sunflower seeds and pumpkin seeds. Peas, lentils, and lima beans. Whole grain and bran cereals and breads. Fresh fruits and vegetables, such as apricots, avocado, bananas, cantaloupe, kiwi, oranges, tomatoes, asparagus, and potatoes. Juices, such as orange, tomato, and prune. Lean meats, including fish. Milk and milk products, such as yogurt. General instructions Take over-the-counter and prescription medicines only as told by your health care provider. This includes vitamins, natural food products, and supplements. Keep all follow-up visits. This is important. Contact a health care provider if: You have weakness that gets worse. You feel your heart pounding or racing. You vomit. You have diarrhea. You have diabetes and you have trouble keeping your blood sugar in your target range. Get help right away if: You have chest pain. You have shortness of breath. You have vomiting or diarrhea that lasts for more than 2 days. You faint. These symptoms may be an emergency. Get help right away. Call 911. Do not wait to see if the symptoms will go away. Do not drive yourself to the hospital. Summary Hypokalemia means that the amount of potassium in the blood is lower than normal. This condition is diagnosed with a blood test. Hypokalemia may be treated by taking potassium supplements, adjusting the medicines that you take, or eating more foods that are high in potassium. If your potassium level is very low, you may need to get potassium through an IV and be monitored in the hospital. This information is not intended to replace advice given to you by your health care provider. Make sure you discuss any questions you have with your health care provider. Document Revised: 12/06/2020 Document Reviewed: 12/06/2020 Else[employer] Patient Education  [place].

## 2021-12-11 NOTE — Progress Notes (Signed)
[facility] [phone]   [phone]  OFFICE PROGRESS NOTE  [name], PA-C [address] [place] [address]  DIAGNOSIS: Stage IV (T3, N1, M1b) non-small cell lung cancer favoring squamous cell carcinoma presented with superior segment left lower lobe lung mass with invasion of the chest wall and destruction of the posterior left sixth and seventh ribs as well as left hilar adenopathy and suspicious pleural metastasis diagnosed in March 2023  PRIOR THERAPY:  1) Palliative radiotherapy to the left lower lobe lung mass with chest wall invasion under the care of Dr. Sondra Come. 2) Palliative systemic chemotherapy with carboplatin for AUC of 5, paclitaxel 175 Mg/M2 and Libtayo (Cempilimab) 350 Mg IV every 3 weeks with Neulasta support.  First dose was given on July 18, 2021.  Status post 4 cycles.  CURRENT THERAPY: Maintenance treatment with Libtayo (Cempilimab) 350 Mg IV every 3 weeks, first dose October 10, 2021 status post 2 cycles.  INTERVAL HISTORY: Edward Beck 76 y.o. male returns to the clinic today for follow-up visit.  The patient continues to complain of increasing fatigue and weakness as well as lack of appetite and weight loss.  He also has trouble eating but drinks protein drinks with some help.  He was found recently to have significant hypokalemia and treated with potassium supplement.  He lost around 5 pounds since his last visit.  He denied having any chest pain but continues to have mild cough and no shortness of breath or hemoptysis.  He has no nausea, vomiting, diarrhea or constipation.  He has no headache or visual changes.  He has been off treatment for the last 3 weeks and he is here today for evaluation before resuming his treatment with immunotherapy.    MEDICAL HISTORY: Past Medical History:  Diagnosis Date   Condyloma acuminatum of scrotum    COVID 2021   December 2022 - mild   History of radiation therapy    Left  Lung- 07/23/21-08/02/21- Dr. Gery Pray   Lung cancer [facility])    Pneumonia    Scrotal lesion     ALLERGIES:  is allergic to tamiflu [oseltamivir phosphate].  MEDICATIONS:  Current Outpatient Medications  Medication Sig Dispense Refill   Cyanocobalamin (B-12) 1000 MCG TABS Take 1 tablet by mouth daily.     finasteride (PROSCAR) 5 MG tablet Take 1 tablet (5 mg total) by mouth daily. 90 tablet 3   gabapentin (NEURONTIN) 100 MG capsule Take 1 capsule (100 mg total) by mouth 3 (three) times daily. 90 capsule 2   lidocaine-prilocaine (EMLA) cream Apply to the Port-A-Cath site 30 minutes before chemotherapy. 30 g 0   mirtazapine (REMERON) 15 MG tablet Take 1 tablet (15 mg total) by mouth at bedtime. 30 tablet 2   potassium chloride SA (KLOR-CON M) 20 MEQ tablet Take 1 tablet (20 mEq total) by mouth daily. 7 tablet 0   prochlorperazine (COMPAZINE) 10 MG tablet Take 1 tablet (10 mg total) by mouth every 6 (six) hours as needed for nausea or vomiting. (Patient not taking: Reported on 09/19/2021) 30 tablet 0   triamcinolone cream (KENALOG) 0.1 % Apply 1 application. topically 2 (two) times daily as needed (dry skin). (Patient not taking: Reported on 09/19/2021)     umeclidinium-vilanterol (ANORO ELLIPTA) 62.5-25 MCG/ACT AEPB Inhale 1 puff into the lungs daily. (Patient not taking: Reported on 09/12/2021) 7 each 0   No current facility-administered medications for this visit.    SURGICAL HISTORY:  Past Surgical History:  Procedure Laterality Date   BRONCHIAL BIOPSY  07/02/2021   Procedure: BRONCHIAL BIOPSIES;  Surgeon: Garner Nash, DO;  Location: [facility];  Service: Pulmonary;;   BRONCHIAL BRUSHINGS  07/02/2021   Procedure: BRONCHIAL BRUSHINGS;  Surgeon: Garner Nash, DO;  Location: [facility];  Service: Pulmonary;;   BRONCHIAL NEEDLE ASPIRATION BIOPSY  07/02/2021   Procedure: BRONCHIAL NEEDLE ASPIRATION BIOPSIES;  Surgeon: Garner Nash, DO;  Location: [facility];  Service:  Pulmonary;;   HERNIA REPAIR  2009   left inguinal   IR IMAGING GUIDED PORT INSERTION  07/16/2021   SCROTAL EXPLORATION  11/21/2011   Procedure: SCROTUM EXPLORATION;  Surgeon: Hanley Ben, MD;  Location: [facility];  Service: Urology;  Laterality: N/A;  EXCISION, BIOPSY SCROTAL CONDYLOMA    VIDEO BRONCHOSCOPY WITH ENDOBRONCHIAL ULTRASOUND Bilateral 07/02/2021   Procedure: VIDEO BRONCHOSCOPY WITH ENDOBRONCHIAL ULTRASOUND;  Surgeon: Garner Nash, DO;  Location: [facility];  Service: Pulmonary;  Laterality: Bilateral;    REVIEW OF SYSTEMS:  Constitutional: positive for anorexia, fatigue, and weight loss Eyes: negative Ears, nose, mouth, throat, and face: negative Respiratory: positive for cough Cardiovascular: negative Gastrointestinal: negative Genitourinary:negative Integument/breast: negative Hematologic/lymphatic: negative Musculoskeletal:positive for muscle weakness Neurological: negative Behavioral/Psych: negative Endocrine: negative Allergic/Immunologic: negative   PHYSICAL EXAMINATION: General appearance: alert, cooperative, fatigued, and no distress Head: Normocephalic, without obvious abnormality, atraumatic Neck: no adenopathy, no JVD, supple, symmetrical, trachea midline, and thyroid not enlarged, symmetric, no tenderness/mass/nodules Lymph nodes: Cervical, supraclavicular, and axillary nodes normal. Resp: clear to auscultation bilaterally Back: symmetric, no curvature. ROM normal. No CVA tenderness. Cardio: regular rate and rhythm, S1, S2 normal, no murmur, click, rub or gallop GI: soft, non-tender; bowel sounds normal; no masses,  no organomegaly Extremities: extremities normal, atraumatic, no cyanosis or edema Neurologic: Alert and oriented X 3, normal strength and tone. Normal symmetric reflexes. Normal coordination and gait  ECOG PERFORMANCE STATUS: 1 - Symptomatic but completely ambulatory  Blood pressure 95/68, pulse (!) 102, temperature 97.7 F (36.5 C),  temperature source Oral, resp. rate (!) 5, weight 127 lb 8 oz (57.8 kg), SpO2 98 %.  LABORATORY DATA: Lab Results  Component Value Date   WBC 6.0 12/11/2021   HGB 11.7 (L) 12/11/2021   HCT 34.0 (L) 12/11/2021   MCV 92.6 12/11/2021   PLT 234 12/11/2021      Chemistry      Component Value Date/Time   NA 136 11/29/2021 1600   K 2.8 (L) 11/29/2021 1600   CL 114 (H) 11/29/2021 1600   CO2 18 (L) 11/29/2021 1600   BUN 28 (H) 11/29/2021 1600   CREATININE 1.17 11/29/2021 1600   CREATININE 1.27 (H) 11/20/2021 1242      Component Value Date/Time   CALCIUM 11.4 (H) 11/29/2021 1600   ALKPHOS 75 11/29/2021 1600   AST 16 11/29/2021 1600   AST 18 11/20/2021 1242   ALT 15 11/29/2021 1600   ALT 16 11/20/2021 1242   BILITOT 0.6 11/29/2021 1600   BILITOT 0.3 11/20/2021 1242       RADIOGRAPHIC STUDIES: CT Head Wo Contrast  Result Date: 11/29/2021 CLINICAL DATA:  Stage IV metastatic non-small cell lung cancer with chest pain and generalized weakness. EXAM: CT HEAD WITHOUT CONTRAST TECHNIQUE: Contiguous axial images were obtained from the base of the skull through the vertex without intravenous contrast. RADIATION DOSE REDUCTION: This exam was performed according to the departmental dose-optimization program which includes automated exposure control, adjustment of the mA and/or kV according to patient size  and/or use of iterative reconstruction technique. COMPARISON:  None Available. FINDINGS: Brain: There is mild cerebral atrophy with widening of the extra-axial spaces and ventricular dilatation. There are areas of decreased attenuation within the white matter tracts of the supratentorial brain, consistent with microvascular disease changes. Vascular: No hyperdense vessel or unexpected calcification. Skull: Normal. Negative for fracture or focal lesion. Sinuses/Orbits: No acute finding. Other: None. IMPRESSION: 1. No acute intracranial pathology. 2. Generalized cerebral atrophy with microvascular  disease changes within the white matter tracts of the supratentorial brain. Electronically Signed   By: Virgina Norfolk M.D.   On: 11/29/2021 19:00   CT Chest W Contrast  Result Date: 11/29/2021 CLINICAL DATA:  History of non-small cell lung cancer presenting with chest pain and generalized weakness. EXAM: CT CHEST WITH CONTRAST TECHNIQUE: Multidetector CT imaging of the chest was performed during intravenous contrast administration. RADIATION DOSE REDUCTION: This exam was performed according to the departmental dose-optimization program which includes automated exposure control, adjustment of the mA and/or kV according to patient size and/or use of iterative reconstruction technique. CONTRAST:  44mL OMNIPAQUE IOHEXOL 300 MG/ML  SOLN COMPARISON:  November 16, 2021 FINDINGS: Cardiovascular: A right-sided venous Port-A-Cath is in place. There is moderate severity calcification of the thoracic aorta, without evidence of aortic aneurysm or dissection. Normal heart size with mild to moderate severity coronary artery calcification. No pericardial effusion. Mediastinum/Nodes: Multiple subcentimeter AP window, paratracheal and subcarinal lymph nodes are seen. Thyroid gland, trachea, and esophagus demonstrate no significant findings. Lungs/Pleura: Centrilobular and paraseptal emphysematous lung disease is noted. Marked severity left upper lobe and moderate severity adjacent left lower lobe consolidative disease is seen. This is increased in severity when compared to the prior study. Mild, similar appearing changes are present along the anteromedial aspect of the right upper lobe. Left-sided volume loss is noted. Mild lingular, posterior left lower lobe and lateral right basilar scarring and/or atelectasis is seen. There is no evidence of a pleural effusion or pneumothorax. Upper Abdomen: A 1.6 cm simple cyst is seen within the posterior aspect of the mid to upper right kidney. Musculoskeletal: Multilevel degenerative  changes seen throughout the thoracic spine. IMPRESSION: 1. Worsening marked severity left upper lobe and moderate severity left lower lobe consolidative disease and associated left-sided volume loss. While this is likely, in part, post treatment related, a superimposed infectious component cannot be excluded. 2. Mild lingular, posterior left lower lobe and lateral right basilar scarring and/or atelectasis. 3. Centrilobular and paraseptal emphysematous lung disease. 4. 1.6 cm simple cyst within the right kidney (Bosniak 1). No additional follow-up imaging is recommended. This recommendation follows ACR consensus guidelines: Management of the Incidental Renal Mass on CT: A White Paper of the ACR Incidental Findings Committee. J Am Coll Radiol [phone]. Aortic Atherosclerosis (ICD10-I70.0) and Emphysema (ICD10-J43.9). Electronically Signed   By: Virgina Norfolk M.D.   On: 11/29/2021 18:59   DG Chest Port 1 View  Result Date: 11/29/2021 CLINICAL DATA:  Chest pain lung cancer EXAM: PORTABLE CHEST 1 VIEW COMPARISON:  07/02/2021, CT 11/16/2021 FINDINGS: Right-sided central venous port tip at the cavoatrial region. Emphysema. Volume loss on the left with shift of mediastinal contents to the left. Heterogeneous opacity in the left mid lung, corresponding to the irregular consolidations noted on recent chest CT imaging. Mild left apical pleural thickening. Normal cardiac size. No pneumothorax. IMPRESSION: 1. Heterogeneous airspace disease in the left mid lung, slightly progressive as compared with scout image from recent chest CT 11/16/2021, possible radiation changes but difficult to exclude  superimposed acute infectious or inflammatory process. See chest CT report from 11/16/2021. 2. Emphysema Electronically Signed   By: Donavan Foil M.D.   On: 11/29/2021 16:16   CT Chest W Contrast  Result Date: 11/18/2021 CLINICAL DATA:  Squamous cell carcinoma of the left lung. Restaging. * Tracking Code: BO * EXAM: CT  CHEST, ABDOMEN, AND PELVIS WITH CONTRAST TECHNIQUE: Multidetector CT imaging of the chest, abdomen and pelvis was performed following the standard protocol during bolus administration of intravenous contrast. RADIATION DOSE REDUCTION: This exam was performed according to the departmental dose-optimization program which includes automated exposure control, adjustment of the mA and/or kV according to patient size and/or use of iterative reconstruction technique. CONTRAST:  178mL OMNIPAQUE IOHEXOL 300 MG/ML  SOLN COMPARISON:  09/17/2021 FINDINGS: CT CHEST FINDINGS Cardiovascular: The heart size is normal. No substantial pericardial effusion. Mild atherosclerotic calcification is noted in the wall of the thoracic aorta. Right Port-A-Cath tip is positioned in the right atrium near the SVC junction. Mediastinum/Nodes: No mediastinal lymphadenopathy. There is no hilar lymphadenopathy. The esophagus has normal imaging features. There is no axillary lymphadenopathy. Lungs/Pleura: Centrilobular and paraseptal emphysema evident. Interval development of paramediastinal subpleural reticulation and subpleural consolidative opacity, potentially related to radiation therapy. There is progressive suprahilar and parahilar left lung consolidative disease, presumably secondary to radiation changes secondary to treatment of the superior segment left lower lobe lung lesion. Rounded atelectasis/scarring posterior left lower lobe with volume loss left hemithorax is similar to prior. Musculoskeletal: Decreased lucency in the posterior left seventh rib may reflect a component of interval healing. CT ABDOMEN PELVIS FINDINGS Hepatobiliary: No suspicious focal abnormality within the liver parenchyma. There is no evidence for gallstones, gallbladder wall thickening, or pericholecystic fluid. No intrahepatic or extrahepatic biliary dilation. Pancreas: No focal mass lesion. No dilatation of the main duct. No intraparenchymal cyst. No  peripancreatic edema. Spleen: No splenomegaly. No focal mass lesion. Adrenals/Urinary Tract: No adrenal nodule or mass. 2-3 mm nonobstructing stone noted lower pole right kidney. Tiny cysts again noted right kidney. Left kidney unremarkable. No evidence for hydroureter. The urinary bladder appears normal for the degree of distention. Stomach/Bowel: Stomach is unremarkable. No gastric wall thickening. No evidence of outlet obstruction. Duodenum is normally positioned as is the ligament of Treitz. No small bowel wall thickening. No small bowel dilatation. The terminal ileum is normal. The appendix is normal. No gross colonic mass. No colonic wall thickening. Diverticular changes are noted in the left colon without evidence of diverticulitis. Vascular/Lymphatic: There is moderate atherosclerotic calcification of the abdominal aorta without aneurysm. There is no gastrohepatic or hepatoduodenal ligament lymphadenopathy. No retroperitoneal or mesenteric lymphadenopathy. No pelvic sidewall lymphadenopathy. Reproductive: Prostate gland appears enlarged. Other: No intraperitoneal free fluid. Musculoskeletal: No worrisome lytic or sclerotic osseous abnormality. IMPRESSION: 1. Interval development of paramediastinal subpleural reticulation and subpleural consolidative opacity, presumably related to radiation therapy. There is progressive suprahilar and parahilar left lung consolidative disease, also likely secondary to radiation changes secondary to treatment of the superior segment left lower lobe lung lesion. Close attention on follow-up recommended given the interval progression of these findings. 2. No evidence for metastatic disease in the abdomen, or pelvis. 3. Nonobstructing right renal stone. 4. Prostatomegaly. 5. Aortic Atherosclerosis (ICD10-I70.0) and Emphysema (ICD10-J43.9). Electronically Signed   By: Misty Stanley M.D.   On: 11/18/2021 08:44   CT Abdomen Pelvis W Contrast  Result Date: 11/18/2021 CLINICAL  DATA:  Squamous cell carcinoma of the left lung. Restaging. * Tracking Code: BO * EXAM: CT CHEST, ABDOMEN,  AND PELVIS WITH CONTRAST TECHNIQUE: Multidetector CT imaging of the chest, abdomen and pelvis was performed following the standard protocol during bolus administration of intravenous contrast. RADIATION DOSE REDUCTION: This exam was performed according to the departmental dose-optimization program which includes automated exposure control, adjustment of the mA and/or kV according to patient size and/or use of iterative reconstruction technique. CONTRAST:  162mL OMNIPAQUE IOHEXOL 300 MG/ML  SOLN COMPARISON:  09/17/2021 FINDINGS: CT CHEST FINDINGS Cardiovascular: The heart size is normal. No substantial pericardial effusion. Mild atherosclerotic calcification is noted in the wall of the thoracic aorta. Right Port-A-Cath tip is positioned in the right atrium near the SVC junction. Mediastinum/Nodes: No mediastinal lymphadenopathy. There is no hilar lymphadenopathy. The esophagus has normal imaging features. There is no axillary lymphadenopathy. Lungs/Pleura: Centrilobular and paraseptal emphysema evident. Interval development of paramediastinal subpleural reticulation and subpleural consolidative opacity, potentially related to radiation therapy. There is progressive suprahilar and parahilar left lung consolidative disease, presumably secondary to radiation changes secondary to treatment of the superior segment left lower lobe lung lesion. Rounded atelectasis/scarring posterior left lower lobe with volume loss left hemithorax is similar to prior. Musculoskeletal: Decreased lucency in the posterior left seventh rib may reflect a component of interval healing. CT ABDOMEN PELVIS FINDINGS Hepatobiliary: No suspicious focal abnormality within the liver parenchyma. There is no evidence for gallstones, gallbladder wall thickening, or pericholecystic fluid. No intrahepatic or extrahepatic biliary dilation. Pancreas: No  focal mass lesion. No dilatation of the main duct. No intraparenchymal cyst. No peripancreatic edema. Spleen: No splenomegaly. No focal mass lesion. Adrenals/Urinary Tract: No adrenal nodule or mass. 2-3 mm nonobstructing stone noted lower pole right kidney. Tiny cysts again noted right kidney. Left kidney unremarkable. No evidence for hydroureter. The urinary bladder appears normal for the degree of distention. Stomach/Bowel: Stomach is unremarkable. No gastric wall thickening. No evidence of outlet obstruction. Duodenum is normally positioned as is the ligament of Treitz. No small bowel wall thickening. No small bowel dilatation. The terminal ileum is normal. The appendix is normal. No gross colonic mass. No colonic wall thickening. Diverticular changes are noted in the left colon without evidence of diverticulitis. Vascular/Lymphatic: There is moderate atherosclerotic calcification of the abdominal aorta without aneurysm. There is no gastrohepatic or hepatoduodenal ligament lymphadenopathy. No retroperitoneal or mesenteric lymphadenopathy. No pelvic sidewall lymphadenopathy. Reproductive: Prostate gland appears enlarged. Other: No intraperitoneal free fluid. Musculoskeletal: No worrisome lytic or sclerotic osseous abnormality. IMPRESSION: 1. Interval development of paramediastinal subpleural reticulation and subpleural consolidative opacity, presumably related to radiation therapy. There is progressive suprahilar and parahilar left lung consolidative disease, also likely secondary to radiation changes secondary to treatment of the superior segment left lower lobe lung lesion. Close attention on follow-up recommended given the interval progression of these findings. 2. No evidence for metastatic disease in the abdomen, or pelvis. 3. Nonobstructing right renal stone. 4. Prostatomegaly. 5. Aortic Atherosclerosis (ICD10-I70.0) and Emphysema (ICD10-J43.9). Electronically Signed   By: Misty Stanley M.D.   On:  11/18/2021 08:44    ASSESSMENT AND PLAN: This is a very pleasant 76 years old white male with Stage IV (T3, N1, M1b) non-small cell lung cancer favoring squamous cell carcinoma presented with superior segment left lower lobe lung mass with invasion of the chest wall and destruction of the posterior left sixth and seventh ribs as well as left hilar adenopathy and suspicious pleural metastasis diagnosed in March 2023 He is currently undergoing palliative radiotherapy to the left lower lobe lung mass with chest wall invasion under the care of  Dr. Sondra Come. He is also undergoing systemic chemotherapy with carboplatin for AUC of 5, paclitaxel 175 Mg/M2 and Libtayo (Cempilimab) 350 Mg IV every 3 weeks.  He started the first dose of his treatment on July 18, 2021.  Status post 4 cycles.   The patient is started maintenance treatment with single agent Libtayo (Cempilimab) 350 Mg IV every 3 weeks on October 10, 2021 status post 2 cycles.  He has been tolerating this treatment well except for the increasing fatigue and weakness. He has been off treatment for the last 3 weeks but he has no significant improvement in his condition and he continues to have significant weakness and fatigue as well as lack of appetite and weight loss. I recommended for the patient to delay his treatment by 3 more weeks until improvement of his condition. In the meantime I will start the patient on a tapered dose of prednisone for the next 3 weeks to help with his appetite as well as any adverse effect from the immunotherapy. For the dehydration and hypokalemia, I will arrange for the patient to receive 1 L of normal saline with 20 mEq of potassium chloride in the clinic today.  I will also send a prescription of potassium chloride 40 mEq p.o. daily for 10 days.  He will need to follow-up closely with his primary care physician for management of his hypokalemia. I will see him back for follow-up visit in 3 weeks before resuming his  treatment. He was advised to call immediately if he has any other concerning symptoms in the interval. The patient voices understanding of current disease status and treatment options and is in agreement with the current care plan.  All questions were answered. The patient knows to call the clinic with any problems, questions or concerns. We can certainly see the patient much sooner if necessary.  The total time spent in the appointment was 30 minutes.  Disclaimer: This note was dictated with voice recognition software. Similar sounding words can inadvertently be transcribed and may not be corrected upon review.

## 2021-12-12 ENCOUNTER — Other Ambulatory Visit: Payer: Medicare Other

## 2021-12-12 ENCOUNTER — Ambulatory Visit: Payer: Medicare Other | Admitting: Physician Assistant

## 2021-12-12 ENCOUNTER — Ambulatory Visit: Payer: Medicare Other

## 2021-12-12 LAB — T4: T4, Total: 4.2 ug/dL — ABNORMAL LOW (ref 4.5–12.0)

## 2021-12-16 ENCOUNTER — Telehealth: Payer: Self-pay | Admitting: Internal Medicine

## 2021-12-16 NOTE — Telephone Encounter (Signed)
Called patient regarding all upcoming appointments, patient has been called and notified. 

## 2021-12-23 DIAGNOSIS — E876 Hypokalemia: Secondary | ICD-10-CM | POA: Diagnosis not present

## 2022-01-02 ENCOUNTER — Inpatient Hospital Stay (HOSPITAL_BASED_OUTPATIENT_CLINIC_OR_DEPARTMENT_OTHER): Payer: Medicare Other | Admitting: Internal Medicine

## 2022-01-02 ENCOUNTER — Other Ambulatory Visit: Payer: Self-pay

## 2022-01-02 ENCOUNTER — Inpatient Hospital Stay: Payer: Medicare Other

## 2022-01-02 ENCOUNTER — Inpatient Hospital Stay: Payer: Medicare Other | Admitting: Dietician

## 2022-01-02 VITALS — BP 115/71 | HR 85 | Temp 97.7°F | Resp 16 | Wt 125.6 lb

## 2022-01-02 DIAGNOSIS — E876 Hypokalemia: Secondary | ICD-10-CM | POA: Diagnosis not present

## 2022-01-02 DIAGNOSIS — C3492 Malignant neoplasm of unspecified part of left bronchus or lung: Secondary | ICD-10-CM | POA: Diagnosis not present

## 2022-01-02 DIAGNOSIS — R5383 Other fatigue: Secondary | ICD-10-CM

## 2022-01-02 DIAGNOSIS — C3432 Malignant neoplasm of lower lobe, left bronchus or lung: Secondary | ICD-10-CM | POA: Diagnosis not present

## 2022-01-02 DIAGNOSIS — E860 Dehydration: Secondary | ICD-10-CM | POA: Diagnosis not present

## 2022-01-02 DIAGNOSIS — Z5112 Encounter for antineoplastic immunotherapy: Secondary | ICD-10-CM

## 2022-01-02 DIAGNOSIS — Z95828 Presence of other vascular implants and grafts: Secondary | ICD-10-CM

## 2022-01-02 DIAGNOSIS — Z79899 Other long term (current) drug therapy: Secondary | ICD-10-CM | POA: Diagnosis not present

## 2022-01-02 DIAGNOSIS — C7989 Secondary malignant neoplasm of other specified sites: Secondary | ICD-10-CM | POA: Diagnosis not present

## 2022-01-02 LAB — CBC WITH DIFFERENTIAL (CANCER CENTER ONLY)
Abs Immature Granulocytes: 0.11 10*3/uL — ABNORMAL HIGH (ref 0.00–0.07)
Basophils Absolute: 0 10*3/uL (ref 0.0–0.1)
Basophils Relative: 0 %
Eosinophils Absolute: 0.1 10*3/uL (ref 0.0–0.5)
Eosinophils Relative: 1 %
HCT: 37.6 % — ABNORMAL LOW (ref 39.0–52.0)
Hemoglobin: 12.5 g/dL — ABNORMAL LOW (ref 13.0–17.0)
Immature Granulocytes: 2 %
Lymphocytes Relative: 5 %
Lymphs Abs: 0.3 10*3/uL — ABNORMAL LOW (ref 0.7–4.0)
MCH: 31.6 pg (ref 26.0–34.0)
MCHC: 33.2 g/dL (ref 30.0–36.0)
MCV: 94.9 fL (ref 80.0–100.0)
Monocytes Absolute: 0.4 10*3/uL (ref 0.1–1.0)
Monocytes Relative: 5 %
Neutro Abs: 5.8 10*3/uL (ref 1.7–7.7)
Neutrophils Relative %: 87 %
Platelet Count: 98 10*3/uL — ABNORMAL LOW (ref 150–400)
RBC: 3.96 MIL/uL — ABNORMAL LOW (ref 4.22–5.81)
RDW: 15.3 % (ref 11.5–15.5)
WBC Count: 6.7 10*3/uL (ref 4.0–10.5)
nRBC: 0 % (ref 0.0–0.2)

## 2022-01-02 LAB — CMP (CANCER CENTER ONLY)
ALT: 23 U/L (ref 0–44)
AST: 19 U/L (ref 15–41)
Albumin: 3.3 g/dL — ABNORMAL LOW (ref 3.5–5.0)
Alkaline Phosphatase: 87 U/L (ref 38–126)
Anion gap: 8 (ref 5–15)
BUN: 30 mg/dL — ABNORMAL HIGH (ref 8–23)
CO2: 25 mmol/L (ref 22–32)
Calcium: 8.8 mg/dL — ABNORMAL LOW (ref 8.9–10.3)
Chloride: 105 mmol/L (ref 98–111)
Creatinine: 1.09 mg/dL (ref 0.61–1.24)
GFR, Estimated: >60 mL/min (ref >60)
Glucose, Bld: 93 mg/dL (ref 70–99)
Potassium: 3.8 mmol/L (ref 3.5–5.1)
Sodium: 138 mmol/L (ref 135–145)
Total Bilirubin: 0.5 mg/dL (ref 0.3–1.2)
Total Protein: 6.3 g/dL — ABNORMAL LOW (ref 6.5–8.1)

## 2022-01-02 LAB — TSH: TSH: 1.078 u[IU]/mL (ref 0.350–4.500)

## 2022-01-02 MED ORDER — SODIUM CHLORIDE 0.9 % IV SOLN
350.0000 mg | Freq: Once | INTRAVENOUS | Status: AC
  Administered 2022-01-02: 350 mg via INTRAVENOUS
  Filled 2022-01-02: qty 7

## 2022-01-02 MED ORDER — SODIUM CHLORIDE 0.9% FLUSH
10.0000 mL | Freq: Once | INTRAVENOUS | Status: AC
  Administered 2022-01-02: 10 mL

## 2022-01-02 MED ORDER — HEPARIN SOD (PORK) LOCK FLUSH 100 UNIT/ML IV SOLN
500.0000 [IU] | Freq: Once | INTRAVENOUS | Status: AC | PRN
  Administered 2022-01-02: 500 [IU]

## 2022-01-02 MED ORDER — SODIUM CHLORIDE 0.9 % IV SOLN: Freq: Once | INTRAVENOUS | Status: AC

## 2022-01-02 MED ORDER — SODIUM CHLORIDE 0.9% FLUSH
10.0000 mL | INTRAVENOUS | Status: DC | PRN
  Administered 2022-01-02: 10 mL

## 2022-01-02 NOTE — Progress Notes (Signed)
[place] [phone]) [phone]   Fax:([phone]) [phone]  OFFICE PROGRESS NOTE  [name], PA-C [address] [place] [address]  DIAGNOSIS: Stage IV (T3, N1, M1b) non-small cell lung cancer favoring squamous cell carcinoma presented with superior segment left lower lobe lung mass with invasion of the chest wall and destruction of the posterior left sixth and seventh ribs as well as left hilar adenopathy and suspicious pleural metastasis diagnosed in March 2023  PRIOR THERAPY:  1) Palliative radiotherapy to the left lower lobe lung mass with chest wall invasion under the care of Dr. Sondra Come. 2) Palliative systemic chemotherapy with carboplatin for AUC of 5, paclitaxel 175 Mg/M2 and Libtayo (Cempilimab) 350 Mg IV every 3 weeks with Neulasta support.  First dose was given on July 18, 2021.  Status post 4 cycles.  CURRENT THERAPY: Maintenance treatment with Libtayo (Cempilimab) 350 Mg IV every 3 weeks, first dose October 10, 2021 status post 2 cycles.  INTERVAL HISTORY: Edward Beck 76 y.o. male returns to the clinic today for follow-up visit accompanied by his wife.  The patient is feeling fine today with no concerning complaints.  He had significant improvement in his general condition after treatment with a tapered dose of prednisone over the last 3 weeks.  He is currently on prednisone 10 mg p.o. daily and this will continue for the next 2 days.  He denied having any chest pain, shortness of breath, cough or hemoptysis.  He lost few pounds since his last visit.  He denied having any nausea, vomiting, diarrhea or constipation.  He has no headache or visual changes.  He is here today for evaluation before resuming his treatment with immunotherapy.  MEDICAL HISTORY: Past Medical History:  Diagnosis Date   Condyloma acuminatum of scrotum    COVID 2021   December 2022 - mild   History of radiation therapy    Left Lung- 07/23/21-08/02/21- Dr. Gery Pray    Lung cancer [facility])    Pneumonia    Scrotal lesion     ALLERGIES:  is allergic to tamiflu [oseltamivir phosphate].  MEDICATIONS:  Current Outpatient Medications  Medication Sig Dispense Refill   Cyanocobalamin (B-12) 1000 MCG TABS Take 1 tablet by mouth daily.     finasteride (PROSCAR) 5 MG tablet Take 1 tablet (5 mg total) by mouth daily. 90 tablet 3   gabapentin (NEURONTIN) 100 MG capsule Take 1 capsule (100 mg total) by mouth 3 (three) times daily. (Patient taking differently: Take 100 mg by mouth daily. Once a day per patient) 90 capsule 2   lidocaine-prilocaine (EMLA) cream Apply to the Port-A-Cath site 30 minutes before chemotherapy. 30 g 0   mirtazapine (REMERON) 15 MG tablet Take 1 tablet (15 mg total) by mouth at bedtime. 30 tablet 2   predniSONE (DELTASONE) 10 MG tablet 6 tablet p.o. daily for 5 days followed by 5 tablet p.o. daily for 5 days followed by 4 tablet p.o. daily for 5 days followed by 2 tablets p.o. daily for 5 days followed by 1 tablet p.o. daily for 5 days. 90 tablet 0   prochlorperazine (COMPAZINE) 10 MG tablet Take 1 tablet (10 mg total) by mouth every 6 (six) hours as needed for nausea or vomiting. 30 tablet 0   triamcinolone cream (KENALOG) 0.1 % Apply 1 application  topically 2 (two) times daily as needed (dry skin).     umeclidinium-vilanterol (ANORO ELLIPTA) 62.5-25 MCG/ACT AEPB Inhale 1 puff into the lungs daily.  7 each 0   potassium chloride SA (KLOR-CON M) 20 MEQ tablet Take 1 tablet (20 mEq total) by mouth 2 (two) times daily. 20 tablet 0   No current facility-administered medications for this visit.    SURGICAL HISTORY:  Past Surgical History:  Procedure Laterality Date   BRONCHIAL BIOPSY  07/02/2021   Procedure: BRONCHIAL BIOPSIES;  Surgeon: Garner Nash, DO;  Location: [facility];  Service: Pulmonary;;   BRONCHIAL BRUSHINGS  07/02/2021   Procedure: BRONCHIAL BRUSHINGS;  Surgeon: Garner Nash, DO;  Location: [facility];  Service: Pulmonary;;    BRONCHIAL NEEDLE ASPIRATION BIOPSY  07/02/2021   Procedure: BRONCHIAL NEEDLE ASPIRATION BIOPSIES;  Surgeon: Garner Nash, DO;  Location: [facility];  Service: Pulmonary;;   HERNIA REPAIR  2009   left inguinal   IR IMAGING GUIDED PORT INSERTION  07/16/2021   SCROTAL EXPLORATION  11/21/2011   Procedure: SCROTUM EXPLORATION;  Surgeon: Hanley Ben, MD;  Location: [facility];  Service: Urology;  Laterality: N/A;  EXCISION, BIOPSY SCROTAL CONDYLOMA    VIDEO BRONCHOSCOPY WITH ENDOBRONCHIAL ULTRASOUND Bilateral 07/02/2021   Procedure: VIDEO BRONCHOSCOPY WITH ENDOBRONCHIAL ULTRASOUND;  Surgeon: Garner Nash, DO;  Location: [facility];  Service: Pulmonary;  Laterality: Bilateral;    REVIEW OF SYSTEMS:  A comprehensive review of systems was negative except for: Constitutional: positive for weight loss   PHYSICAL EXAMINATION: General appearance: alert, cooperative, and no distress Head: Normocephalic, without obvious abnormality, atraumatic Neck: no adenopathy, no JVD, supple, symmetrical, trachea midline, and thyroid not enlarged, symmetric, no tenderness/mass/nodules Lymph nodes: Cervical, supraclavicular, and axillary nodes normal. Resp: clear to auscultation bilaterally Back: symmetric, no curvature. ROM normal. No CVA tenderness. Cardio: regular rate and rhythm, S1, S2 normal, no murmur, click, rub or gallop GI: soft, non-tender; bowel sounds normal; no masses,  no organomegaly Extremities: extremities normal, atraumatic, no cyanosis or edema  ECOG PERFORMANCE STATUS: 1 - Symptomatic but completely ambulatory  Blood pressure 115/71, pulse 85, temperature 97.7 F (36.5 C), temperature source Oral, resp. rate 16, weight 125 lb 9.6 oz (57 kg), SpO2 97 %.  LABORATORY DATA: Lab Results  Component Value Date   WBC 6.7 01/02/2022   HGB 12.5 (L) 01/02/2022   HCT 37.6 (L) 01/02/2022   MCV 94.9 01/02/2022   PLT 98 (L) 01/02/2022      Chemistry      Component Value Date/Time   NA  138 01/02/2022 0747   K 3.8 01/02/2022 0747   CL 105 01/02/2022 0747   CO2 25 01/02/2022 0747   BUN 30 (H) 01/02/2022 0747   CREATININE 1.09 01/02/2022 0747      Component Value Date/Time   CALCIUM 8.8 (L) 01/02/2022 0747   ALKPHOS 87 01/02/2022 0747   AST 19 01/02/2022 0747   ALT 23 01/02/2022 0747   BILITOT 0.5 01/02/2022 0747       RADIOGRAPHIC STUDIES: No results found.  ASSESSMENT AND PLAN: This is a very pleasant 76 years old white male with Stage IV (T3, N1, M1b) non-small cell lung cancer favoring squamous cell carcinoma presented with superior segment left lower lobe lung mass with invasion of the chest wall and destruction of the posterior left sixth and seventh ribs as well as left hilar adenopathy and suspicious pleural metastasis diagnosed in March 2023 He is currently undergoing palliative radiotherapy to the left lower lobe lung mass with chest wall invasion under the care of Dr. Sondra Come. He is also undergoing systemic chemotherapy with carboplatin for AUC of 5, paclitaxel 175  Mg/M2 and Libtayo (Cempilimab) 350 Mg IV every 3 weeks.  He started the first dose of his treatment on July 18, 2021.  Status post 4 cycles.   The patient is started maintenance treatment with single agent Libtayo (Cempilimab) 350 Mg IV every 3 weeks on October 10, 2021 status post 2 cycles.  His treatment was cycle #3 was delayed by 3 weeks because the patient had significant fatigue and weakness 3 weeks ago.  He was treated with a tapered dose of prednisone and feeling much better now. I recommended for the patient to resume his treatment and he will start cycle #3 of treatment with Libtayo (Cempilimab) today. I will see him back for follow-up visit in 3 weeks for evaluation before the next cycle of his treatment. The patient was advised to call immediately if he has any other concerning symptoms in the interval. The patient voices understanding of current disease status and treatment options and is in  agreement with the current care plan.  All questions were answered. The patient knows to call the clinic with any problems, questions or concerns. We can certainly see the patient much sooner if necessary.  The total time spent in the appointment was 20 minutes.  Disclaimer: This note was dictated with voice recognition software. Similar sounding words can inadvertently be transcribed and may not be corrected upon review.

## 2022-01-02 NOTE — Progress Notes (Signed)
Per Dr Earlie Server. Ok to treat with PLT 98 K/uL today

## 2022-01-02 NOTE — Patient Instructions (Signed)
[facility]  Discharge Instructions: Thank you for choosing [facility] to provide your oncology and hematology care.   If you have a lab appointment with the [facility], please go directly to the [facility] and check in at the registration area.   Wear comfortable clothing and clothing appropriate for easy access to any Portacath or PICC line.   We strive to give you quality time with your provider. You may need to reschedule your appointment if you arrive late (15 or more minutes).  Arriving late affects you and other patients whose appointments are after yours.  Also, if you miss three or more appointments without notifying the office, you may be dismissed from the clinic at the provider's discretion.      For prescription refill requests, have your pharmacy contact our office and allow 72 hours for refills to be completed.    Today you received the following chemotherapy and/or immunotherapy agents libtayo      To help prevent nausea and vomiting after your treatment, we encourage you to take your nausea medication as directed.  BELOW ARE SYMPTOMS THAT SHOULD BE REPORTED IMMEDIATELY: *FEVER GREATER THAN 100.4 F (38 C) OR HIGHER *CHILLS OR SWEATING *NAUSEA AND VOMITING THAT IS NOT CONTROLLED WITH YOUR NAUSEA MEDICATION *UNUSUAL SHORTNESS OF BREATH *UNUSUAL BRUISING OR BLEEDING *URINARY PROBLEMS (pain or burning when urinating, or frequent urination) *BOWEL PROBLEMS (unusual diarrhea, constipation, pain near the anus) TENDERNESS IN MOUTH AND THROAT WITH OR WITHOUT PRESENCE OF ULCERS (sore throat, sores in mouth, or a toothache) UNUSUAL RASH, SWELLING OR PAIN  UNUSUAL VAGINAL DISCHARGE OR ITCHING   Items with * indicate a potential emergency and should be followed up as soon as possible or go to the Emergency Department if any problems should occur.  Please show the CHEMOTHERAPY ALERT CARD or IMMUNOTHERAPY ALERT CARD at check-in to the  Emergency Department and triage nurse.  Should you have questions after your visit or need to cancel or reschedule your appointment, please contact [facility]  Dept: [phone]  and follow the prompts.  Office hours are 8:00 a.m. to 4:30 p.m. Monday - Friday. Please note that voicemails left after 4:00 p.m. may not be returned until the following business day.  We are closed weekends and major holidays. You have access to a nurse at all times for urgent questions. Please call the main number to the clinic Dept: [phone] and follow the prompts.   For any non-urgent questions, you may also contact your provider using MyChart. We now offer e-Visits for anyone 12 and older to request care online for non-urgent symptoms. For details visit [email or web address].   Also download the MyChart app! Go to the app store, search "MyChart", open the app, select [facility], and log in with your MyChart username and password.  Masks are optional in the cancer centers. If you would like for your care team to wear a mask while they are taking care of you, please let them know. For doctor visits, patients may have with them one support person who is at least 78 years old. At this time, visitors are not allowed in the infusion area.

## 2022-01-02 NOTE — Progress Notes (Signed)
Nutrition Follow-up:  Patient with non small cell lung cancer. He is currently receiving maintenance treatment with Libtayo q3w (started 7/6). Treatment has been held after completing cycle 2 due to fatigue, weakness, poor appetite, weight loss.   Noted 8/25 ED visit secondary to hypokalemia  Met with patient and wife in infusion. Patient reports feeling great s/p treatment break. Patient is getting around better, says he feels stronger. He endorses good appetite and eating 3 meals plus snacks. Wife says they keep little debbie in business. Patient is surprised that he has lost weight with everything he has been eating Pharmacist, community breakfast bowl, cheeseburger, oriental dinners, soups, cookies, frosty's) Patient reports drinking premier or boost a couple times a day. He denies nutrition impact symptoms.  Medications: remeron, klor-con, prednisone taper started 9/6  Labs: BUN 30  Anthropometrics: Wt 125 lb 9.6 oz today decreased   9/6 - 127 lb 8 oz 8/16 - 138 lb 5 oz  7/26 - 150 lb   -25 lbs (16.7%) in 2 months - this is severe  NUTRITION DIAGNOSIS: Unintended weight loss continues    INTERVENTION:  Encouraged high calorie high protein foods to promote weight gain Discussed ways to add calories/protein to foods (switching to whole milk, adding cheese, sauces/gravy, using ONS in coffee vs creamer) Continue drinking Premier/Boost, suggested switching to Boost Plus/equivalent for added calories - recommend 3/day in between meals - Ensure coupons given  Patient agreeable to high protein bedtime snack - handout with ideas given Tip sheet for diarrhea provided     MONITORING, EVALUATION, GOAL: weight trends, intake    NEXT VISIT: Thursday November 9 during infusion

## 2022-01-09 DIAGNOSIS — D485 Neoplasm of uncertain behavior of skin: Secondary | ICD-10-CM | POA: Diagnosis not present

## 2022-01-09 DIAGNOSIS — L988 Other specified disorders of the skin and subcutaneous tissue: Secondary | ICD-10-CM | POA: Diagnosis not present

## 2022-01-09 DIAGNOSIS — L98499 Non-pressure chronic ulcer of skin of other sites with unspecified severity: Secondary | ICD-10-CM | POA: Diagnosis not present

## 2022-01-09 DIAGNOSIS — L859 Epidermal thickening, unspecified: Secondary | ICD-10-CM | POA: Diagnosis not present

## 2022-01-20 NOTE — Progress Notes (Unsigned)
[facility] OFFICE PROGRESS NOTE  [name], PA-C [address] [address]  DIAGNOSIS: Stage IV (T3, N1, M1b) non-small cell lung cancer favoring squamous cell carcinoma presented with superior segment left lower lobe lung mass with invasion of the chest wall and destruction of the posterior left sixth and seventh ribs as well as left hilar adenopathy and suspicious pleural metastasis diagnosed in March 2023  PRIOR THERAPY: 1) Palliative radiotherapy to the left lower lobe lung mass with chest wall invasion under the care of Dr. Sondra Come. 2) Palliative systemic chemotherapy with carboplatin for AUC of 5, paclitaxel 175 Mg/M2 and Libtayo (Cempilimab) 350 Mg IV every 3 weeks with Neulasta support.  First dose was given on July 18, 2021.  Status post 4 cycles.  CURRENT THERAPY: Maintenance treatment with Libtayo (Cempilimab) 350 Mg IV every 3 weeks, first dose October 10, 2021 status post 3 cycles.  INTERVAL HISTORY: JOHNIE MAKKI 76 y.o. male returns to the clinic today for a follow-up visit accompanied by his wife.  The patient took a break from treatment from late July 2023 and resumed treatment on 01/02/2022.  The patient is currently only on single agent immunotherapy with Libtayo.  He had big improvement in his condition after completing a tapering dose of prednisone. However, for the last 5 days, he started feeling poorly again.    For the last 4 to 5 days, the patient is endorsing gradual weakness.  He reports he has been having diarrhea ranging from 4-7 loose stools per day.  He has been taking 1 tablet of Imodium which slows down his diarrhea but does not resolve it completely.  He denies any blood in the stool.  He reports some abdominal discomfort across the bottom.  He also reports some epigastric discomfort occasionally.  He reports he has been fatigued and spending most of the time in bed.  Patient is hypotensive today.  He does not take any  antihypertensive medications.  The patient denies any history of any heart issues.  He had a mild scratchy throat for the last few days.  He denies any leg swelling or calf pain.  His oxygen is 100% on room air.  Thr patient denies any fever, chills, or night sweats.  He denies any hemoptysis.  Denies any nausea, vomiting, or constipation denies any headaches or visual changes.  Denies any rashes or skin changes.He is here today for evaluation and repeat blood work before undergoing cycle #4 of single agent immunotherapy.       MEDICAL HISTORY: Past Medical History:  Diagnosis Date   Condyloma acuminatum of scrotum    COVID 2021   December 2022 - mild   History of radiation therapy    Left Lung- 07/23/21-08/02/21- Dr. Gery Pray   Lung cancer [facility])    Pneumonia    Scrotal lesion     ALLERGIES:  is allergic to tamiflu [oseltamivir phosphate].  MEDICATIONS:  Current Outpatient Medications  Medication Sig Dispense Refill   Cyanocobalamin (B-12) 1000 MCG TABS Take 1 tablet by mouth daily.     finasteride (PROSCAR) 5 MG tablet Take 1 tablet (5 mg total) by mouth daily. 90 tablet 3   gabapentin (NEURONTIN) 100 MG capsule Take 1 capsule (100 mg total) by mouth 3 (three) times daily. (Patient taking differently: Take 100 mg by mouth daily. Once a day per patient) 90 capsule 2   lidocaine-prilocaine (EMLA) cream Apply to the Port-A-Cath site 30 minutes before chemotherapy. 30 g  0   mirtazapine (REMERON) 15 MG tablet Take 1 tablet (15 mg total) by mouth at bedtime. 30 tablet 2   prochlorperazine (COMPAZINE) 10 MG tablet Take 1 tablet (10 mg total) by mouth every 6 (six) hours as needed for nausea or vomiting. 30 tablet 0   triamcinolone cream (KENALOG) 0.1 % Apply 1 application  topically 2 (two) times daily as needed (dry skin).     umeclidinium-vilanterol (ANORO ELLIPTA) 62.5-25 MCG/ACT AEPB Inhale 1 puff into the lungs daily. 7 each 0   potassium chloride SA (KLOR-CON M) 20 MEQ tablet  Take 1 tablet (20 mEq total) by mouth daily. 7 tablet 0   predniSONE (DELTASONE) 10 MG tablet 6 tablet p.o. daily for 5 days followed by 5 tablet p.o. daily for 5 days followed by 4 tablet p.o. daily for 5 days followed by 2 tablets p.o. daily for 5 days followed by 1 tablet p.o. daily for 5 days. 90 tablet 0   No current facility-administered medications for this visit.    SURGICAL HISTORY:  Past Surgical History:  Procedure Laterality Date   BRONCHIAL BIOPSY  07/02/2021   Procedure: BRONCHIAL BIOPSIES;  Surgeon: Garner Nash, DO;  Location: [facility];  Service: Pulmonary;;   BRONCHIAL BRUSHINGS  07/02/2021   Procedure: BRONCHIAL BRUSHINGS;  Surgeon: Garner Nash, DO;  Location: [facility];  Service: Pulmonary;;   BRONCHIAL NEEDLE ASPIRATION BIOPSY  07/02/2021   Procedure: BRONCHIAL NEEDLE ASPIRATION BIOPSIES;  Surgeon: Garner Nash, DO;  Location: [facility];  Service: Pulmonary;;   HERNIA REPAIR  2009   left inguinal   IR IMAGING GUIDED PORT INSERTION  07/16/2021   SCROTAL EXPLORATION  11/21/2011   Procedure: SCROTUM EXPLORATION;  Surgeon: Hanley Ben, MD;  Location: [facility];  Service: Urology;  Laterality: N/A;  EXCISION, BIOPSY SCROTAL CONDYLOMA    VIDEO BRONCHOSCOPY WITH ENDOBRONCHIAL ULTRASOUND Bilateral 07/02/2021   Procedure: VIDEO BRONCHOSCOPY WITH ENDOBRONCHIAL ULTRASOUND;  Surgeon: Garner Nash, DO;  Location: [facility];  Service: Pulmonary;  Laterality: Bilateral;    REVIEW OF SYSTEMS:   Review of Systems  Constitutional: Positive for fatigue and generalized weakness.  Negative for appetite change, chills, fever and unexpected weight change.  HENT: Also for mild scratchy throat.  Negative for mouth sores, nosebleeds, and trouble swallowing.   Eyes: Negative for eye problems and icterus.  Respiratory: Positive for dyspnea.  Negative for cough, hemoptysis,  and wheezing.   Cardiovascular: Negative for chest pain and leg swelling.  Gastrointestinal:  Positive for occasional epigastric discomfort.  Positive for diarrhea.  Negative for constipation,  nausea and vomiting.  Genitourinary: Negative for bladder incontinence, difficulty urinating, dysuria, frequency and hematuria.   Musculoskeletal: Negative for back pain, gait problem, neck pain and neck stiffness.  Skin: Negative for itching and rash.  Neurological: Positive for generalized weakness.  Negative for dizziness, extremity weakness, gait problem, headaches, light-headedness and seizures.  Hematological: Negative for adenopathy. Does not bruise/bleed easily.  Psychiatric/Behavioral: Negative for confusion, depression and sleep disturbance. The patient is not nervous/anxious.     PHYSICAL EXAMINATION:  Blood pressure (!) 82/48, pulse (!) 101, temperature (!) 97.5 F (36.4 C), temperature source Tympanic, resp. rate 18, height 6\' 1"  (1.854 m), weight 123 lb 11.2 oz (56.1 kg), SpO2 100 %.  ECOG PERFORMANCE STATUS: 2-3  Physical Exam  Constitutional: Oriented to person, place, and time and cachectic appearing male and in no distress.  HENT:  Head: Normocephalic and atraumatic.  Mouth/Throat: Oropharynx is clear and moist. No oropharyngeal  exudate.  Eyes: Conjunctivae are normal. Right eye exhibits no discharge. Left eye exhibits no discharge. No scleral icterus.  Neck: Normal range of motion. Neck supple.  Cardiovascular: Normal rate, regular rhythm, normal heart sounds and intact distal pulses.   Pulmonary/Chest: Effort normal and breath sounds normal. No respiratory distress. No wheezes. No rales.  Abdominal: Soft. Bowel sounds are normal. Exhibits no distension and no mass. There is no tenderness.  Musculoskeletal: Normal range of motion. Exhibits no edema.  Lymphadenopathy:    No cervical adenopathy.  Neurological: Alert and oriented to person, place, and time. Exhibits also wasting.  Gait normal. Coordination normal.  Skin: Skin is warm and dry. No rash noted. Not diaphoretic.  No erythema. No pallor.  Psychiatric: Mood, memory and judgment normal.  Vitals reviewed.  LABORATORY DATA: Lab Results  Component Value Date   WBC 3.9 (L) 01/22/2022   HGB 11.7 (L) 01/22/2022   HCT 35.7 (L) 01/22/2022   MCV 93.0 01/22/2022   PLT 173 01/22/2022      Chemistry      Component Value Date/Time   NA 139 01/22/2022 1329   K 3.2 (L) 01/22/2022 1329   CL 110 01/22/2022 1329   CO2 23 01/22/2022 1329   BUN 21 01/22/2022 1329   CREATININE 1.27 (H) 01/22/2022 1329      Component Value Date/Time   CALCIUM 9.8 01/22/2022 1329   ALKPHOS 91 01/22/2022 1329   AST 14 (L) 01/22/2022 1329   ALT 8 01/22/2022 1329   BILITOT 0.5 01/22/2022 1329       RADIOGRAPHIC STUDIES:  No results found.   ASSESSMENT/PLAN:  This is a very pleasant 76 year old Caucasian male with stage IV (T3, N1, M1 B) non-small cell lung cancer, favoring squamous cell carcinoma.  The patient presented with a superior segment left upper lobe lung mass with invasion of the chest and destruction of the posterior left sixth and seventh ribs as well as left hilar adenopathy.  The patient has suspicious pleural metastases.  The patient was diagnosed in March 2023.  The patient underwent palliative radiation to left lower lobe lung mass and chest wall lesion under the care of Dr. Sondra Come.  He then underwent systemic chemotherapy with carboplatin for an AUC 5, paclitaxel 175 mg per metered squared, Libtayo 350 mg IV every 3 weeks.  He completed 4 cycles this was started on 07/18/2021.  He then started maintenance treatment with single agent Libtayo 350 mg IV every 3 weeks starting 10/10/2021.  Cycle 3 was delayed due to significant fatigue and weakness.  He was treated with a tapering dose of prednisone.  The patient was seen with Dr. Julien Nordmann today.  The patient is reporting increased fatigue and weakness.  He is also endorsing diarrhea and remittent epigastric discomfort.  Dr. Julien Nordmann feels that the patient has  intolerance to immunotherapy.  We will resend a high-dose prednisone taper to the pharmacy.  He is advised to take this first thing in the morning.  We reviewed side effects associated with steroids including swelling, insomnia, increased appetite, and increased blood sugar if he had diabetes.   He will not receive treatment today.  Instead we will arrange for him to receive 1 L of fluid over 2 hours.  The patient has ability to monitor his blood pressure at home and he was encouraged to be checking this daily.  If he has hypotension, he was instructed to call the clinic for further evaluation and management.   His potassium is slightly low  likely secondary to the diarrhea.  He was given a prescription of potassium 20 mill equivalents daily for the next 7 days.  He also was instructed to take Imodium for diarrhea.  I reviewed the dosing with him.  We will arrange for him to have GI pathogen panel testing performed as well as C. difficile testing just in case to rule out infectious etiology.    We will see the patient back for follow-up in 2 weeks and repeat blood work.  I will also arrange for the patient to have a restaging CT scan of the chest, abdomen, and pelvis in approximately 4 weeks.  We will arrange for an EKG to be performed today although he later points to his discomfort as being in the epi gastric region as opposed to the chest.  The patient was advised to call immediately if he has any concerning symptoms in the interval. The patient voices understanding of current disease status and treatment options and is in agreement with the current care plan. All questions were answered. The patient knows to call the clinic with any problems, questions or concerns. We can certainly see the patient much sooner if necessary       Orders Placed This Encounter  Procedures   GI pathogen panel by PCR, stool    Standing Status:   Future    Standing Expiration Date:   01/23/2023   C difficile  quick screen w PCR reflex    Standing Status:   Future    Standing Expiration Date:   01/23/2023   CT Chest W Contrast    Standing Status:   Future    Standing Expiration Date:   01/22/2023    Order Specific Question:   If indicated for the ordered procedure, I authorize the administration of contrast media per Radiology protocol    Answer:   Yes    Order Specific Question:   Preferred imaging location?    Answer:   [facility]   CT Abdomen Pelvis W Contrast    Standing Status:   Future    Standing Expiration Date:   01/22/2023    Order Specific Question:   If indicated for the ordered procedure, I authorize the administration of contrast media per Radiology protocol    Answer:   Yes    Order Specific Question:   Preferred imaging location?    Answer:   [facility]    Order Specific Question:   Is Oral Contrast requested for this exam?    Answer:   Yes, Per Radiology protocol   CBC with Differential ([place] Only)    Standing Status:   Future    Standing Expiration Date:   02/06/2023   CMP ([place] only)    Standing Status:   Future    Standing Expiration Date:   02/06/2023   EKG 12-Lead      Taiten Brawn L Keasia Dubose, PA-C 01/22/22  ADDENDUM: Hematology/Oncology Attending: I had a face-to-face encounter with the patient today.  I reviewed his record, lab and recommended his care plan.  This is a very pleasant 76 years old white male with history of stage IV non-small cell lung cancer, favoring squamous cell carcinoma diagnosed in March 2023 status post palliative radiotherapy to left lower lobe lung mass with chest wall invasion followed by palliative systemic chemotherapy initially with carboplatin, paclitaxel and Libtayo (Cempilimab) for 4 cycles with partial response.  The patient is currently on maintenance treatment with Libtayo (Cempilimab) every 3 weeks  status post 3 cycles.  His treatment was interrupted few weeks ago with significant shortness  of breath and fatigue and he was treated with tapered dose of prednisone for suspicious immunotherapy mediated pneumonitis.  The patient has been complaining of increasing fatigue and weakness and he has diarrhea up to 7 times a day.  He took some Imodium with improvement but no resolution of his diarrhea. His blood pressure was low in the clinic today.  He also has a scratchy throat and generalized weakness. I recommended for the patient to hold any additional treatment with Libtayo (Cempilimab) at this point. I will start the patient on a tapered dose of prednisone over the next few weeks. We will arrange for the patient to receive IV hydration with normal saline in the clinic today.  We will also check his stool for any infectious etiology. I will arrange for him to have repeat CT scan of the chest, abdomen and pelvis in 1 months for restaging of his disease before discussion of the next treatment options. The patient and his wife are in agreement with the current plan. He will come back for follow-up visit in 2 weeks for evaluation and repeat blood work. He was advised to call immediately if he has any other concerning symptoms in the interval.  The total time spent in the appointment was 30 minutes. Disclaimer: This note was dictated with voice recognition software. Similar sounding words can inadvertently be transcribed and may be missed upon review. Eilleen Kempf, MD

## 2022-01-22 ENCOUNTER — Inpatient Hospital Stay: Payer: Medicare Other | Attending: Internal Medicine

## 2022-01-22 ENCOUNTER — Inpatient Hospital Stay (HOSPITAL_BASED_OUTPATIENT_CLINIC_OR_DEPARTMENT_OTHER): Payer: Medicare Other | Admitting: Physician Assistant

## 2022-01-22 ENCOUNTER — Other Ambulatory Visit: Payer: Self-pay

## 2022-01-22 ENCOUNTER — Inpatient Hospital Stay: Payer: Medicare Other

## 2022-01-22 VITALS — BP 82/48 | HR 101 | Temp 97.5°F | Resp 18 | Ht 73.0 in | Wt 123.7 lb

## 2022-01-22 DIAGNOSIS — C3492 Malignant neoplasm of unspecified part of left bronchus or lung: Secondary | ICD-10-CM

## 2022-01-22 DIAGNOSIS — C7989 Secondary malignant neoplasm of other specified sites: Secondary | ICD-10-CM | POA: Diagnosis not present

## 2022-01-22 DIAGNOSIS — I959 Hypotension, unspecified: Secondary | ICD-10-CM

## 2022-01-22 DIAGNOSIS — C7951 Secondary malignant neoplasm of bone: Secondary | ICD-10-CM | POA: Insufficient documentation

## 2022-01-22 DIAGNOSIS — R197 Diarrhea, unspecified: Secondary | ICD-10-CM

## 2022-01-22 DIAGNOSIS — E876 Hypokalemia: Secondary | ICD-10-CM | POA: Diagnosis not present

## 2022-01-22 DIAGNOSIS — C3432 Malignant neoplasm of lower lobe, left bronchus or lung: Secondary | ICD-10-CM | POA: Insufficient documentation

## 2022-01-22 DIAGNOSIS — R5383 Other fatigue: Secondary | ICD-10-CM

## 2022-01-22 DIAGNOSIS — Z95828 Presence of other vascular implants and grafts: Secondary | ICD-10-CM

## 2022-01-22 LAB — CBC WITH DIFFERENTIAL (CANCER CENTER ONLY)
Abs Immature Granulocytes: 0.09 10*3/uL — ABNORMAL HIGH (ref 0.00–0.07)
Basophils Absolute: 0 10*3/uL (ref 0.0–0.1)
Basophils Relative: 1 %
Eosinophils Absolute: 0 10*3/uL (ref 0.0–0.5)
Eosinophils Relative: 1 %
HCT: 35.7 % — ABNORMAL LOW (ref 39.0–52.0)
Hemoglobin: 11.7 g/dL — ABNORMAL LOW (ref 13.0–17.0)
Immature Granulocytes: 2 %
Lymphocytes Relative: 10 %
Lymphs Abs: 0.4 10*3/uL — ABNORMAL LOW (ref 0.7–4.0)
MCH: 30.5 pg (ref 26.0–34.0)
MCHC: 32.8 g/dL (ref 30.0–36.0)
MCV: 93 fL (ref 80.0–100.0)
Monocytes Absolute: 0.4 10*3/uL (ref 0.1–1.0)
Monocytes Relative: 10 %
Neutro Abs: 3 10*3/uL (ref 1.7–7.7)
Neutrophils Relative %: 76 %
Platelet Count: 173 10*3/uL (ref 150–400)
RBC: 3.84 MIL/uL — ABNORMAL LOW (ref 4.22–5.81)
RDW: 14.6 % (ref 11.5–15.5)
Smear Review: NORMAL
WBC Count: 3.9 10*3/uL — ABNORMAL LOW (ref 4.0–10.5)
nRBC: 0 % (ref 0.0–0.2)

## 2022-01-22 LAB — TSH: TSH: 1.392 u[IU]/mL (ref 0.350–4.500)

## 2022-01-22 LAB — CMP (CANCER CENTER ONLY)
ALT: 8 U/L (ref 0–44)
AST: 14 U/L — ABNORMAL LOW (ref 15–41)
Albumin: 3.2 g/dL — ABNORMAL LOW (ref 3.5–5.0)
Alkaline Phosphatase: 91 U/L (ref 38–126)
Anion gap: 6 (ref 5–15)
BUN: 21 mg/dL (ref 8–23)
CO2: 23 mmol/L (ref 22–32)
Calcium: 9.8 mg/dL (ref 8.9–10.3)
Chloride: 110 mmol/L (ref 98–111)
Creatinine: 1.27 mg/dL — ABNORMAL HIGH (ref 0.61–1.24)
GFR, Estimated: 58 mL/min — ABNORMAL LOW (ref >60)
Glucose, Bld: 109 mg/dL — ABNORMAL HIGH (ref 70–99)
Potassium: 3.2 mmol/L — ABNORMAL LOW (ref 3.5–5.1)
Sodium: 139 mmol/L (ref 135–145)
Total Bilirubin: 0.5 mg/dL (ref 0.3–1.2)
Total Protein: 6.2 g/dL — ABNORMAL LOW (ref 6.5–8.1)

## 2022-01-22 MED ORDER — POTASSIUM CHLORIDE CRYS ER 20 MEQ PO TBCR: 20.0000 meq | EXTENDED_RELEASE_TABLET | Freq: Every day | ORAL | 0 refills | Status: DC

## 2022-01-22 MED ORDER — SODIUM CHLORIDE 0.9% FLUSH
10.0000 mL | Freq: Once | INTRAVENOUS | Status: AC
  Administered 2022-01-22: 10 mL via INTRAVENOUS

## 2022-01-22 MED ORDER — HEPARIN SOD (PORK) LOCK FLUSH 100 UNIT/ML IV SOLN
500.0000 [IU] | Freq: Once | INTRAVENOUS | Status: AC
  Administered 2022-01-22: 500 [IU] via INTRAVENOUS

## 2022-01-22 MED ORDER — SODIUM CHLORIDE 0.9 % IV SOLN: INTRAVENOUS | Status: DC

## 2022-01-22 MED ORDER — PREDNISONE 10 MG PO TABS: ORAL_TABLET | ORAL | 0 refills | Status: DC

## 2022-01-22 MED ORDER — SODIUM CHLORIDE 0.9% FLUSH
10.0000 mL | Freq: Once | INTRAVENOUS | Status: AC
  Administered 2022-01-22: 10 mL

## 2022-01-22 NOTE — Patient Instructions (Addendum)
Rehydration, Elderly Rehydration is the replacement of body fluids, salts, and minerals (electrolytes) that are lost during dehydration. Dehydration is when there is not enough water or other fluids in the body. This happens when you lose more fluids than you take in. People who are age 78 or older have a higher risk of dehydration than younger adults. Common causes of dehydration include: Conditions that cause loss of water or other fluids, such as diarrhea, vomiting, sweating, or urinating a lot. Not drinking enough fluids. This can occur when you are ill or doing activities that require a lot of energy, especially in hot weather. Other illnesses and conditions, such as fever or infection. Certain medicines, such as those that remove excess fluid from the body (diuretics). Not being able to get enough water and food. Symptoms of mild or moderate dehydration may include thirst, dry lips and mouth, and dizziness. Symptoms of severe dehydration may include increased heart rate, confusion, fainting, and not urinating. For severe dehydration, you may need to get fluids through an IV at the hospital. For mild or moderate dehydration, you can usually rehydrate at home by drinking certain fluids as told by your health care provider. What are the risks? Generally, rehydration is safe. However, taking in too much fluid (overhydration) can be a problem. This is rare. Overhydration can cause an electrolyte imbalance, kidney failure, fluid in the lungs, or a decrease in salt (sodium) levels in the body. Supplies needed: You will need an oral rehydration solution (ORS) if your health care provider tells you to use one. This is a drink designed to treat dehydration. It can be found in pharmacies and retail stores. How to rehydrate Fluids Follow instructions from your health care provider for rehydration. The kind of fluid and the amount you should drink depend on your condition. In general, for mild dehydration,  you should choose drinks that you prefer. If told by your health care provider, drink an ORS. Make an ORS by following instructions on the package. Start by drinking small amounts, about  cup (120 mL) every 5-10 minutes. Slowly increase how much you drink until you have taken the amount recommended by your health care provider. Drink enough fluids to keep your urine pale yellow. If you were told to drink an ORS, finish the ORS first, then start slowly drinking other clear fluids. Drink fluids such as: Water. This includes sparkling water and flavored water. Drinking only waterwhile rehydrating can lead to having too little sodium in your body (hyponatremia). Follow instructions from your health care provider. Water from ice chips you suck on. Fruit juice with water you add to it(diluted). Sports drinks. Hot or cold herbal teas. Broth-based soups. Coffee. Milk or milk products. Food Follow instructions from your health care provider about what to eat while you rehydrate. Your health care provider may recommend that you slowly begin eating regular foods in small amounts. Eat foods that contain a healthy balance of electrolytes, such as bananas, oranges, potatoes, tomatoes, and spinach. Avoid foods that are greasy or contain a lot of sugar. In some cases, you may get nutrition through a feeding tube that is passed through your nose and into your stomach (nasogastric tube, or NG tube). This may be done if you have uncontrolled vomiting or diarrhea. Beverages to avoid  Certain beverages may make dehydration worse. While you rehydrate, avoid drinking alcohol. How to tell if you are recovering from dehydration You may be recovering from dehydration if: You are urinating more often than before  you started rehydrating. Your urine is pale yellow. Your energy level improves. You vomit less frequently. You have diarrhea less frequently. Your appetite improves or returns to normal. You feel less  dizzy or less light-headed. Your skin tone and color start to look more normal. Follow these instructions at home: Take over-the-counter and prescription medicines only as told by your health care provider. Do not take sodium tablets. Doing this can lead to having too much sodium in your body (hypernatremia). Contact a health care provider if: You continue to have symptoms of mild or moderate dehydration, such as: Thirst. Dry lips. Slightly dry mouth. Dizziness. Dark urine or less urine than usual. Muscle cramps. You continue to vomit or have diarrhea. Get help right away if you: Have symptoms of dehydration that get worse. Have a fever. Have a severe headache. Have been vomiting and the following happens: Your vomiting gets worse. Your vomit includes blood or green matter (bile). You cannot eat or drink without vomiting. Have problems with urination or bowel movements, such as: Diarrhea that gets worse. Blood in your stool (feces). This may cause stool to look black and tarry. Not urinating, or urinating only a small amount of very dark urine, within 6-8 hours. Have trouble breathing. Have symptoms that get worse with treatment. These symptoms may represent a serious problem that is an emergency. Do not wait to see if the symptoms will go away. Get medical help right away. Call your local emergency services (911 in the [place] Do not drive yourself to the hospital. Summary Rehydration is the replacement of body fluids, salts, and minerals (electrolytes) that are lost during dehydration. Follow instructions from your health care provider for rehydration. The kind of fluid and the amount you should drink depend on your condition. Slowly increase how much you drink until you have taken the amount recommended by your health care provider. Contact your health care provider if you continue to show signs of mild or moderate dehydration. This information is not intended to replace advice  given to you by your health care provider. Make sure you discuss any questions you have with your health care provider. Document Revised: 05/25/2019 Document Reviewed: 05/12/2019 Elsevier Patient Education  [employer].  Hypokalemia Hypokalemia means that the amount of potassium in the blood is lower than normal. Potassium is a mineral (electrolyte) that helps regulate the amount of fluid in the body. It also stimulates muscle tightening (contraction) and helps nerves work properly. Normally, most of the body's potassium is inside cells, and only a very small amount is in the blood. Because the amount in the blood is so small, minor changes to potassium levels in the blood can be life-threatening. What are the causes? This condition may be caused by: Antibiotic medicine. Diarrhea or vomiting. Taking too much of a medicine that helps you have a bowel movement (laxative) can cause diarrhea and lead to hypokalemia. Chronic kidney disease (CKD). Medicines that help the body get rid of excess fluid (diuretics). Eating disorders, such as anorexia or bulimia. Low magnesium levels in the body. Sweating a lot. What are the signs or symptoms? Symptoms of this condition include: Weakness. Constipation. Fatigue. Muscle cramps. Mental confusion. Skipped heartbeats or irregular heartbeat (palpitations). Tingling or numbness. How is this diagnosed? This condition is diagnosed with a blood test. How is this treated? This condition may be treated by: Taking potassium supplements. Adjusting the medicines that you take. Eating more foods that contain a lot of potassium. If your potassium level  is very low, you may need to get potassium through an IV and be monitored in the hospital. Follow these instructions at home: Eating and drinking  Eat a healthy diet. A healthy diet includes fresh fruits and vegetables, whole grains, healthy fats, and lean proteins. If told, eat more foods that contain  a lot of potassium. These include: Nuts, such as peanuts and pistachios. Seeds, such as sunflower seeds and pumpkin seeds. Peas, lentils, and lima beans. Whole grain and bran cereals and breads. Fresh fruits and vegetables, such as apricots, avocado, bananas, cantaloupe, kiwi, oranges, tomatoes, asparagus, and potatoes. Juices, such as orange, tomato, and prune. Lean meats, including fish. Milk and milk products, such as yogurt. General instructions Take over-the-counter and prescription medicines only as told by your health care provider. This includes vitamins, natural food products, and supplements. Keep all follow-up visits. This is important. Contact a health care provider if: You have weakness that gets worse. You feel your heart pounding or racing. You vomit. You have diarrhea. You have diabetes and you have trouble keeping your blood sugar in your target range. Get help right away if: You have chest pain. You have shortness of breath. You have vomiting or diarrhea that lasts for more than 2 days. You faint. These symptoms may be an emergency. Get help right away. Call 911. Do not wait to see if the symptoms will go away. Do not drive yourself to the hospital. Summary Hypokalemia means that the amount of potassium in the blood is lower than normal. This condition is diagnosed with a blood test. Hypokalemia may be treated by taking potassium supplements, adjusting the medicines that you take, or eating more foods that are high in potassium. If your potassium level is very low, you may need to get potassium through an IV and be monitored in the hospital. This information is not intended to replace advice given to you by your health care provider. Make sure you discuss any questions you have with your health care provider. Document Revised: 12/06/2020 Document Reviewed: 12/06/2020 Elsevier Patient Education  [place].

## 2022-01-22 NOTE — Progress Notes (Signed)
Per Cassie Heilingoetter, PA okay to run IVF over 1.5 hours today.

## 2022-02-03 NOTE — Progress Notes (Unsigned)
[facility] OFFICE PROGRESS NOTE  [name], PA-C [place] [address]  DIAGNOSIS: Stage IV (T3, N1, M1b) non-small cell lung cancer favoring squamous cell carcinoma presented with superior segment left lower lobe lung mass with invasion of the chest wall and destruction of the posterior left sixth and seventh ribs as well as left hilar adenopathy and suspicious pleural metastasis diagnosed in March 2023  PRIOR THERAPY:  1) Palliative radiotherapy to the left lower lobe lung mass with chest wall invasion under the care of Dr. Sondra Come. 2) Palliative systemic chemotherapy with carboplatin for AUC of 5, paclitaxel 175 Mg/M2 and Libtayo (Cempilimab) 350 Mg IV every 3 weeks with Neulasta support.  First dose was given on July 18, 2021.  Status post 4 cycles.  CURRENT THERAPY:  Maintenance treatment with Libtayo (Cempilimab) 350 Mg IV every 3 weeks, first dose October 10, 2021 status post 3 cycles.  Treatment is on hold secondary to intolerance  INTERVAL HISTORY: Edward Beck 76 y.o. male returns to the clinic today for a follow-up visit accompanied by his wife.  The patient was last seen on 01/22/22.  At that point time, the patient was having worsening weakness, diarrhea, hypotension, and abdominal discomfort. Dr. Julien Nordmann felt that the patient has intolerance to treatment.  Therefore, he was started on high-dose prednisone taper for which he is currently taking 40mg  per day.  The patient is tolerating this very well.  His symptoms have disappeared.  His diarrhea resolved so he never completed the GI pathogen panel testing.   He denies any recent fever, chills, or night sweats.  He gained weight since last visit and reports his appetite is better.   He denies any nausea, vomiting, constipation, diarrhea.  Denies chest pain, shortness of breath, cough, or hemoptysis.  He has been walking about 2 miles down his driveway some days. He denies any rashes, reports some  bruising on is right wrist from prior impacts.    He is supposed to have a restaging CT scan around 02/18/22.  He was reminded to schedule this appointment and notified we will have an office visit to discuss results after reading.   He is here today for evaluation and repeat blood work.    MEDICAL HISTORY: Past Medical History:  Diagnosis Date   Condyloma acuminatum of scrotum    COVID 2021   December 2022 - mild   History of radiation therapy    Left Lung- 07/23/21-08/02/21- Dr. Gery Pray   Lung cancer [facility])    Pneumonia    Scrotal lesion     ALLERGIES:  is allergic to tamiflu [oseltamivir phosphate].  MEDICATIONS:  Current Outpatient Medications  Medication Sig Dispense Refill   Cyanocobalamin (B-12) 1000 MCG TABS Take 1 tablet by mouth daily.     finasteride (PROSCAR) 5 MG tablet Take 1 tablet (5 mg total) by mouth daily. 90 tablet 3   gabapentin (NEURONTIN) 100 MG capsule Take 1 capsule (100 mg total) by mouth 3 (three) times daily. (Patient taking differently: Take 100 mg by mouth daily. Once a day per patient) 90 capsule 2   lidocaine-prilocaine (EMLA) cream Apply to the Port-A-Cath site 30 minutes before chemotherapy. 30 g 0   mirtazapine (REMERON) 15 MG tablet Take 1 tablet (15 mg total) by mouth at bedtime. 30 tablet 2   potassium chloride SA (KLOR-CON M) 20 MEQ tablet Take 1 tablet (20 mEq total) by mouth daily. 7 tablet 0   predniSONE (DELTASONE) 10  MG tablet 6 tablet p.o. daily for 5 days followed by 5 tablet p.o. daily for 5 days followed by 4 tablet p.o. daily for 5 days followed by 2 tablets p.o. daily for 5 days followed by 1 tablet p.o. daily for 5 days. 90 tablet 0   prochlorperazine (COMPAZINE) 10 MG tablet Take 1 tablet (10 mg total) by mouth every 6 (six) hours as needed for nausea or vomiting. 30 tablet 0   triamcinolone cream (KENALOG) 0.1 % Apply 1 application  topically 2 (two) times daily as needed (dry skin).     umeclidinium-vilanterol (ANORO  ELLIPTA) 62.5-25 MCG/ACT AEPB Inhale 1 puff into the lungs daily. 7 each 0   Current Facility-Administered Medications  Medication Dose Route Frequency Provider Last Rate Last Admin   heparin lock flush 100 unit/mL  500 Units Intravenous Once Sheryl Saintil L, PA-C       sodium chloride flush (NS) 0.9 % injection 10 mL  10 mL Intravenous PRN Layonna Dobie L, PA-C        SURGICAL HISTORY:  Past Surgical History:  Procedure Laterality Date   BRONCHIAL BIOPSY  07/02/2021   Procedure: BRONCHIAL BIOPSIES;  Surgeon: Garner Nash, DO;  Location: [facility];  Service: Pulmonary;;   BRONCHIAL BRUSHINGS  07/02/2021   Procedure: BRONCHIAL BRUSHINGS;  Surgeon: Garner Nash, DO;  Location: [facility];  Service: Pulmonary;;   BRONCHIAL NEEDLE ASPIRATION BIOPSY  07/02/2021   Procedure: BRONCHIAL NEEDLE ASPIRATION BIOPSIES;  Surgeon: Garner Nash, DO;  Location: [facility];  Service: Pulmonary;;   HERNIA REPAIR  2009   left inguinal   IR IMAGING GUIDED PORT INSERTION  07/16/2021   SCROTAL EXPLORATION  11/21/2011   Procedure: SCROTUM EXPLORATION;  Surgeon: Hanley Ben, MD;  Location: [facility];  Service: Urology;  Laterality: N/A;  EXCISION, BIOPSY SCROTAL CONDYLOMA    VIDEO BRONCHOSCOPY WITH ENDOBRONCHIAL ULTRASOUND Bilateral 07/02/2021   Procedure: VIDEO BRONCHOSCOPY WITH ENDOBRONCHIAL ULTRASOUND;  Surgeon: Garner Nash, DO;  Location: [facility];  Service: Pulmonary;  Laterality: Bilateral;    REVIEW OF SYSTEMS:   Review of Systems  Constitutional: Negative for appetite change (improved), chills, fatigue (improved), fever and unexpected weight change (improved).  HENT: Negative for mouth sores, nosebleeds, sore throat and trouble swallowing.   Eyes: Negative for eye problems and icterus.  Respiratory: Negative for cough, hemoptysis, shortness of breath and wheezing.   Cardiovascular: Negative for chest pain and leg swelling.  Gastrointestinal: Negative for  abdominal pain, constipation, diarrhea (resolved), nausea and vomiting.  Genitourinary: Negative for bladder incontinence, difficulty urinating, dysuria, frequency and hematuria.   Musculoskeletal: Negative for back pain, gait problem, neck pain and neck stiffness.  Skin: Negative for itching and rash.  Neurological: Negative for dizziness, extremity weakness, gait problem, headaches, light-headedness and seizures.  Hematological: Negative for adenopathy. Does not bruise/bleed easily.  Psychiatric/Behavioral: Negative for confusion, depression and sleep disturbance. The patient is not nervous/anxious.     PHYSICAL EXAMINATION:  Blood pressure 125/71, pulse 67, temperature 97.8 F (36.6 C), temperature source Oral, resp. rate 14, weight 131 lb 8 oz (59.6 kg), SpO2 100 %.  ECOG PERFORMANCE STATUS: 1  Physical Exam  Constitutional: Oriented to person, place, and time and thin appearing male, and in no distress.  HENT:  Head: Normocephalic and atraumatic.  Mouth/Throat: Oropharynx is clear and moist. No oropharyngeal exudate.  Eyes: Conjunctivae are normal. Right eye exhibits no discharge. Left eye exhibits no discharge. No scleral icterus.  Neck: Normal range of motion. Neck supple.  Cardiovascular: Normal rate, regular rhythm, normal heart sounds and intact distal pulses.   Pulmonary/Chest: Effort normal and breath sounds normal. No respiratory distress. No wheezes. No rales.  Abdominal: Exhibits no distension  Musculoskeletal: Normal range of motion. Exhibits no edema.  Lymphadenopathy:    No cervical adenopathy.  Neurological: Alert and oriented to person, place, and time. Exhibits normal muscle tone. Gait normal. Coordination normal.  Skin: Skin is warm and dry. No rash noted. Not diaphoretic. No erythema. No pallor. Bruising on right wrist  Psychiatric: Mood, memory and judgment normal.  Vitals reviewed.  LABORATORY DATA: Lab Results  Component Value Date   WBC 17.6 (H)  02/04/2022   HGB 11.6 (L) 02/04/2022   HCT 35.5 (L) 02/04/2022   MCV 92.7 02/04/2022   PLT 185 02/04/2022      Chemistry      Component Value Date/Time   NA 139 01/22/2022 1329   K 3.2 (L) 01/22/2022 1329   CL 110 01/22/2022 1329   CO2 23 01/22/2022 1329   BUN 21 01/22/2022 1329   CREATININE 1.27 (H) 01/22/2022 1329      Component Value Date/Time   CALCIUM 9.8 01/22/2022 1329   ALKPHOS 91 01/22/2022 1329   AST 14 (L) 01/22/2022 1329   ALT 8 01/22/2022 1329   BILITOT 0.5 01/22/2022 1329       RADIOGRAPHIC STUDIES:  No results found.   ASSESSMENT/PLAN:  This is a very pleasant 76 year old Caucasian male with stage IV (T3, N1, M1 B) non-small cell lung cancer, favoring squamous cell carcinoma.  The patient presented with a superior segment left upper lobe lung mass with invasion of the chest and destruction of the posterior left sixth and seventh ribs as well as left hilar adenopathy.  The patient has suspicious pleural metastases.  The patient was diagnosed in March 2023.   The patient underwent palliative radiation to left lower lobe lung mass and chest wall lesion under the care of Dr. Sondra Come.   He then underwent systemic chemotherapy with carboplatin for an AUC 5, paclitaxel 175 mg per metered squared, Libtayo 350 mg IV every 3 weeks.  He completed 4 cycles this was started on 07/18/2021.   He then started maintenance treatment with single agent Libtayo 350 mg IV every 3 weeks starting 10/10/2021.  Cycle 3 was delayed due to significant fatigue and weakness.  He was treated with a tapering dose of prednisone.   The patient was last seen by me and Dr. Julien Nordmann on 01/22/2022.  The patient was endorsing worsening weakness, diarrhea, hypotension, and epigastric discomfort.  Dr. Julien Nordmann restarted his prednisone taper and he is currently taking 40 milligrams daily.   Labs were reviewed. His CMP is pending.  Recommend that he continue on his prednisone taper.   He supposed to have  a restaging CT scan around 11/14.  I do not see that this has been scheduled.  I gave the patient the number to radiology scheduling.   We will see him back for follow-up visit in approximately 2 weeks for evaluation and to review his scan results and to discuss the next steps in his care since the patient does not tolerate immunotherapy well.    His BP has improved, will cancel his infusion as he does not need IVF.    The patient was advised to call immediately if she has any concerning symptoms in the interval. The patient voices understanding of current disease status and treatment options and is in agreement with the current care  plan. All questions were answered. The patient knows to call the clinic with any problems, questions or concerns. We can certainly see the patient much sooner if necessary       No orders of the defined types were placed in this encounter.    The total time spent in the appointment was [place], PA-C 02/04/22

## 2022-02-04 ENCOUNTER — Inpatient Hospital Stay (HOSPITAL_BASED_OUTPATIENT_CLINIC_OR_DEPARTMENT_OTHER): Payer: Medicare Other | Admitting: Physician Assistant

## 2022-02-04 ENCOUNTER — Inpatient Hospital Stay: Payer: Medicare Other

## 2022-02-04 ENCOUNTER — Other Ambulatory Visit: Payer: Medicare Other

## 2022-02-04 ENCOUNTER — Other Ambulatory Visit: Payer: Self-pay

## 2022-02-04 VITALS — BP 125/71 | HR 67 | Temp 97.8°F | Resp 14 | Wt 131.5 lb

## 2022-02-04 DIAGNOSIS — C7951 Secondary malignant neoplasm of bone: Secondary | ICD-10-CM | POA: Diagnosis not present

## 2022-02-04 DIAGNOSIS — Z5111 Encounter for antineoplastic chemotherapy: Secondary | ICD-10-CM

## 2022-02-04 DIAGNOSIS — Z95828 Presence of other vascular implants and grafts: Secondary | ICD-10-CM

## 2022-02-04 DIAGNOSIS — R197 Diarrhea, unspecified: Secondary | ICD-10-CM | POA: Diagnosis not present

## 2022-02-04 DIAGNOSIS — C7989 Secondary malignant neoplasm of other specified sites: Secondary | ICD-10-CM | POA: Diagnosis not present

## 2022-02-04 DIAGNOSIS — C3492 Malignant neoplasm of unspecified part of left bronchus or lung: Secondary | ICD-10-CM

## 2022-02-04 DIAGNOSIS — R5383 Other fatigue: Secondary | ICD-10-CM

## 2022-02-04 DIAGNOSIS — C3432 Malignant neoplasm of lower lobe, left bronchus or lung: Secondary | ICD-10-CM | POA: Diagnosis not present

## 2022-02-04 DIAGNOSIS — I959 Hypotension, unspecified: Secondary | ICD-10-CM | POA: Diagnosis not present

## 2022-02-04 LAB — CBC WITH DIFFERENTIAL (CANCER CENTER ONLY)
Abs Immature Granulocytes: 1.08 10*3/uL — ABNORMAL HIGH (ref 0.00–0.07)
Basophils Absolute: 0.1 10*3/uL (ref 0.0–0.1)
Basophils Relative: 1 %
Eosinophils Absolute: 0 10*3/uL (ref 0.0–0.5)
Eosinophils Relative: 0 %
HCT: 35.5 % — ABNORMAL LOW (ref 39.0–52.0)
Hemoglobin: 11.6 g/dL — ABNORMAL LOW (ref 13.0–17.0)
Immature Granulocytes: 6 %
Lymphocytes Relative: 5 %
Lymphs Abs: 0.9 10*3/uL (ref 0.7–4.0)
MCH: 30.3 pg (ref 26.0–34.0)
MCHC: 32.7 g/dL (ref 30.0–36.0)
MCV: 92.7 fL (ref 80.0–100.0)
Monocytes Absolute: 1.3 10*3/uL — ABNORMAL HIGH (ref 0.1–1.0)
Monocytes Relative: 8 %
Neutro Abs: 14.2 10*3/uL — ABNORMAL HIGH (ref 1.7–7.7)
Neutrophils Relative %: 80 %
Platelet Count: 185 10*3/uL (ref 150–400)
RBC: 3.83 MIL/uL — ABNORMAL LOW (ref 4.22–5.81)
RDW: 16.4 % — ABNORMAL HIGH (ref 11.5–15.5)
Smear Review: NORMAL
WBC Count: 17.6 10*3/uL — ABNORMAL HIGH (ref 4.0–10.5)
nRBC: 0 % (ref 0.0–0.2)

## 2022-02-04 LAB — CMP (CANCER CENTER ONLY)
ALT: 24 U/L (ref 0–44)
AST: 19 U/L (ref 15–41)
Albumin: 3.3 g/dL — ABNORMAL LOW (ref 3.5–5.0)
Alkaline Phosphatase: 73 U/L (ref 38–126)
Anion gap: 4 — ABNORMAL LOW (ref 5–15)
BUN: 31 mg/dL — ABNORMAL HIGH (ref 8–23)
CO2: 27 mmol/L (ref 22–32)
Calcium: 9 mg/dL (ref 8.9–10.3)
Chloride: 106 mmol/L (ref 98–111)
Creatinine: 1 mg/dL (ref 0.61–1.24)
GFR, Estimated: >60 mL/min (ref >60)
Glucose, Bld: 78 mg/dL (ref 70–99)
Potassium: 4 mmol/L (ref 3.5–5.1)
Sodium: 137 mmol/L (ref 135–145)
Total Bilirubin: 0.3 mg/dL (ref 0.3–1.2)
Total Protein: 5.9 g/dL — ABNORMAL LOW (ref 6.5–8.1)

## 2022-02-04 LAB — TSH: TSH: 1.446 u[IU]/mL (ref 0.350–4.500)

## 2022-02-04 MED ORDER — SODIUM CHLORIDE 0.9% FLUSH
10.0000 mL | Freq: Once | INTRAVENOUS | Status: AC
  Administered 2022-02-04: 10 mL

## 2022-02-04 MED ORDER — HEPARIN SOD (PORK) LOCK FLUSH 100 UNIT/ML IV SOLN
500.0000 [IU] | Freq: Once | INTRAVENOUS | Status: AC
  Administered 2022-02-04: 500 [IU] via INTRAVENOUS

## 2022-02-04 MED ORDER — SODIUM CHLORIDE 0.9% FLUSH
10.0000 mL | INTRAVENOUS | Status: DC | PRN
  Administered 2022-02-04: 10 mL via INTRAVENOUS

## 2022-02-04 NOTE — Progress Notes (Signed)
This nurse flushed patients port with normal saline. Heparin locked and deaccessed.  Patient tolerated well.  No signs of bleeding and patient denies pain or discomfort.  Patient left ambulatory and stable with wife.  No further concerns noted at this time.

## 2022-02-05 ENCOUNTER — Ambulatory Visit: Payer: Medicare Other | Admitting: Physician Assistant

## 2022-02-05 ENCOUNTER — Other Ambulatory Visit: Payer: Medicare Other

## 2022-02-05 ENCOUNTER — Ambulatory Visit: Payer: Medicare Other

## 2022-02-06 ENCOUNTER — Telehealth: Payer: Self-pay | Admitting: Physician Assistant

## 2022-02-06 NOTE — Telephone Encounter (Signed)
Called patient regarding upcoming November appointments, patient is notified. 

## 2022-02-13 ENCOUNTER — Ambulatory Visit: Payer: Medicare Other

## 2022-02-13 ENCOUNTER — Other Ambulatory Visit: Payer: Medicare Other

## 2022-02-13 ENCOUNTER — Encounter: Payer: Medicare Other | Admitting: Dietician

## 2022-02-13 ENCOUNTER — Ambulatory Visit: Payer: Medicare Other | Admitting: Physician Assistant

## 2022-02-17 NOTE — Progress Notes (Signed)
[place] OFFICE PROGRESS NOTE  [name], PA-C [place] [address]  DIAGNOSIS: Stage IV (T3, N1, M1b) non-small cell lung cancer favoring squamous cell carcinoma presented with superior segment left lower lobe lung mass with invasion of the chest wall and destruction of the posterior left sixth and seventh ribs as well as left hilar adenopathy and suspicious pleural metastasis diagnosed in March 2023   PRIOR THERAPY: 1) Palliative radiotherapy to the left lower lobe lung mass with chest wall invasion under the care of Dr. Sondra Come. 2) Palliative systemic chemotherapy with carboplatin for AUC of 5, paclitaxel 175 Mg/M2 and Libtayo (Cempilimab) 350 Mg IV every 3 weeks with Neulasta support.  First dose was given on July 18, 2021.  Status post 4 cycles.  CURRENT THERAPY: Maintenance treatment with Libtayo (Cempilimab) 350 Mg IV every 3  weeks, first dose October 10, 2021 status post 3 cycles.  Treatment is on hold secondary to intolerance. Last dose 01/02/22.   INTERVAL HISTORY: Edward Beck 76 y.o. male returns to the clinic today for a follow-up visit accompanied by his wife.  When the patient was seen on 01/22/22 he was having worsening weakness, diarrhea, hypotension, and abdominal discomfort. Dr. Julien Nordmann felt that the patient has intolerance to treatment.  Therefore, he was started on high-dose prednisone taper for which he completed last week. The patient is tolerating this very well. His diarrhea started back up again about 2 days ago about 4-6 times per day. He has not tried taking imodium until today which he believes has "toned it down". Gi pathogen testing was ordered last month but has not been completed because when he was last seen, his GI symptoms had resolved. Denies abdominal pain or blood in the stool. Denies eating or drinking anything out of the ordinary.   He denies any recent fever, chills, or night sweats.  He gained weight since last  visit and reports his appetite is better. His wife states he has been eating a lot. He has some bilateral ankle swelling, without calf pain or erythema. His swelling improves in the morning after having his legs elevated at night. He denies any nausea, vomiting, or constipation.  Denies chest pain, shortness of breath, or hemoptysis. He has a mild cough and a mild sore throat. He thinks that he may have a mild viral infection. He denies any rashes, reports some bruising on is right wrist from prior impacts and associated dry skin.    He recently had a repeat CT scan performed. He is here for evaluation and to review his scan results.         MEDICAL HISTORY: Past Medical History:  Diagnosis Date   Condyloma acuminatum of scrotum    COVID 2021   December 2022 - mild   History of radiation therapy    Left Lung- 07/23/21-08/02/21- Dr. Gery Pray   Lung cancer [facility])    Pneumonia    Scrotal lesion     ALLERGIES:  is allergic to tamiflu [oseltamivir phosphate].  MEDICATIONS:  Current Outpatient Medications  Medication Sig Dispense Refill   Cyanocobalamin (B-12) 1000 MCG TABS Take 1 tablet by mouth daily.     gabapentin (NEURONTIN) 100 MG capsule Take 1 capsule (100 mg total) by mouth 3 (three) times daily. (Patient taking differently: Take 100 mg by mouth daily. Once a day per patient) 90 capsule 2   lidocaine-prilocaine (EMLA) cream Apply to the Port-A-Cath site 30 minutes before chemotherapy. 30 g  0   mirtazapine (REMERON) 15 MG tablet Take 1 tablet (15 mg total) by mouth at bedtime. 30 tablet 2   predniSONE (DELTASONE) 10 MG tablet 6 tablet p.o. daily for 5 days followed by 5 tablet p.o. daily for 5 days followed by 4 tablet p.o. daily for 5 days followed by 2 tablets p.o. daily for 5 days followed by 1 tablet p.o. daily for 5 days. 90 tablet 0   prochlorperazine (COMPAZINE) 10 MG tablet Take 1 tablet (10 mg total) by mouth every 6 (six) hours as needed for nausea or vomiting. 30  tablet 0   triamcinolone cream (KENALOG) 0.1 % Apply 1 application  topically 2 (two) times daily as needed (dry skin).     umeclidinium-vilanterol (ANORO ELLIPTA) 62.5-25 MCG/ACT AEPB Inhale 1 puff into the lungs daily. 7 each 0   finasteride (PROSCAR) 5 MG tablet Take 1 tablet (5 mg total) by mouth daily. (Patient not taking: Reported on 02/20/2022) 90 tablet 3   potassium chloride SA (KLOR-CON M) 20 MEQ tablet Take 1 tablet (20 mEq total) by mouth daily. 7 tablet 0   No current facility-administered medications for this visit.    SURGICAL HISTORY:  Past Surgical History:  Procedure Laterality Date   BRONCHIAL BIOPSY  07/02/2021   Procedure: BRONCHIAL BIOPSIES;  Surgeon: Garner Nash, DO;  Location: [facility];  Service: Pulmonary;;   BRONCHIAL BRUSHINGS  07/02/2021   Procedure: BRONCHIAL BRUSHINGS;  Surgeon: Garner Nash, DO;  Location: [facility];  Service: Pulmonary;;   BRONCHIAL NEEDLE ASPIRATION BIOPSY  07/02/2021   Procedure: BRONCHIAL NEEDLE ASPIRATION BIOPSIES;  Surgeon: Garner Nash, DO;  Location: [facility];  Service: Pulmonary;;   HERNIA REPAIR  2009   left inguinal   IR IMAGING GUIDED PORT INSERTION  07/16/2021   SCROTAL EXPLORATION  11/21/2011   Procedure: SCROTUM EXPLORATION;  Surgeon: Hanley Ben, MD;  Location: [facility];  Service: Urology;  Laterality: N/A;  EXCISION, BIOPSY SCROTAL CONDYLOMA    VIDEO BRONCHOSCOPY WITH ENDOBRONCHIAL ULTRASOUND Bilateral 07/02/2021   Procedure: VIDEO BRONCHOSCOPY WITH ENDOBRONCHIAL ULTRASOUND;  Surgeon: Garner Nash, DO;  Location: [facility];  Service: Pulmonary;  Laterality: Bilateral;    REVIEW OF SYSTEMS:   Review of Systems  Constitutional: Negative for appetite change, chills, fatigue, fever and unexpected weight change.  HENT: Positive for mild sore throat. Negative for mouth sores, nosebleeds, and trouble swallowing.   Eyes: Negative for eye problems and icterus.  Respiratory: Positive for mild cough.  Negative for hemoptysis, shortness of breath and wheezing.   Cardiovascular: Positive for bilateral ankle swelling. Negative for chest pain.  Gastrointestinal: Positive for diarrhea for 2-3 days. Negative for abdominal pain, constipation,  nausea and vomiting.  Genitourinary: Negative for bladder incontinence, difficulty urinating, dysuria, frequency and hematuria.   Musculoskeletal: Negative for back pain, gait problem, neck pain and neck stiffness.  Skin: Negative for itching and rash.  Neurological: Negative for dizziness, extremity weakness, gait problem, headaches, light-headedness and seizures.  Hematological: Negative for adenopathy. Does not bruise/bleed easily.  Psychiatric/Behavioral: Negative for confusion, depression and sleep disturbance. The patient is not nervous/anxious.     PHYSICAL EXAMINATION:  Blood pressure 103/61, pulse 80, temperature 98.2 F (36.8 C), temperature source Oral, resp. rate 15, weight 140 lb (63.5 kg), SpO2 100 %.  ECOG PERFORMANCE STATUS: 1  Physical Exam  Constitutional: Oriented to person, place, and time and well-developed, well-nourished, and in no distress.  HENT:  Head: Normocephalic and atraumatic.  Mouth/Throat: Oropharynx is clear and  moist. No oropharyngeal exudate.  Eyes: Conjunctivae are normal. Right eye exhibits no discharge. Left eye exhibits no discharge. No scleral icterus.  Neck: Normal range of motion. Neck supple.  Cardiovascular: Normal rate, regular rhythm, normal heart sounds and intact distal pulses.   Pulmonary/Chest: Effort normal and breath sounds normal. Quiet breath sounds bilaterally. No wheezes. No rales.  Abdominal: Soft. Bowel sounds are normal. Exhibits no distension and no mass. There is no tenderness.  Musculoskeletal: Normal range of motion. Positive for bilateral ankle swelling with pitting edema.  Lymphadenopathy:    No cervical adenopathy.  Neurological: Alert and oriented to person, place, and time. Exhibits  muscle wasting. Gait normal. Coordination normal.  Skin: Skin is warm and dry. Positive for dry skin on lower extremities. Not diaphoretic. No erythema. No pallor.  Psychiatric: Mood, memory and judgment normal.  Vitals reviewed.  LABORATORY DATA: Lab Results  Component Value Date   WBC 2.5 (L) 02/20/2022   HGB 10.4 (L) 02/20/2022   HCT 32.2 (L) 02/20/2022   MCV 96.4 02/20/2022   PLT 97 (L) 02/20/2022      Chemistry      Component Value Date/Time   NA 138 02/20/2022 0843   K 3.2 (L) 02/20/2022 0843   CL 108 02/20/2022 0843   CO2 24 02/20/2022 0843   BUN 17 02/20/2022 0843   CREATININE 1.09 02/20/2022 0843      Component Value Date/Time   CALCIUM 8.8 (L) 02/20/2022 0843   ALKPHOS 69 02/20/2022 0843   AST 21 02/20/2022 0843   ALT 19 02/20/2022 0843   BILITOT 0.5 02/20/2022 0843       RADIOGRAPHIC STUDIES:  CT Chest W Contrast  Result Date: 02/19/2022 CLINICAL DATA:  Stage IV non-small-cell lung cancer. Superior segment left lower lobe lung mass with chest wall invasion. Diagnosed in March. Radiation therapy to left lower lobe lung mass. Systemic chemotherapy. * Tracking Code: BO * EXAM: CT CHEST, ABDOMEN, AND PELVIS WITH CONTRAST TECHNIQUE: Multidetector CT imaging of the chest, abdomen and pelvis was performed following the standard protocol during bolus administration of intravenous contrast. RADIATION DOSE REDUCTION: This exam was performed according to the departmental dose-optimization program which includes automated exposure control, adjustment of the mA and/or kV according to patient size and/or use of iterative reconstruction technique. CONTRAST:  154mL OMNIPAQUE IOHEXOL 300 MG/ML  SOLN COMPARISON:  11/29/2021 chest CT. Chest abdomen and pelvic CTs of 11/16/2021. FINDINGS: CT CHEST FINDINGS Cardiovascular: Right Port-A-Cath tip high right atrium. Aortic atherosclerosis. Tortuous thoracic aorta. Normal heart size, without pericardial effusion. Lad coronary artery  calcification. No central pulmonary embolism, on this non-dedicated study. Mediastinum/Nodes: No supraclavicular adenopathy. No mediastinal or hilar adenopathy. Lungs/Pleura: No pleural fluid. Moderate centrilobular emphysema. Especially since 11/16/2021, progressive consolidation and traction bronchiectasis in the left upper lobe and adjacent superior segment left lower lobe. No well-defined residual or locally recurrent disease. Right lower lobe calcified pleural-based granuloma of 4 mm. Right upper and right middle lobe triangular shaped nodules are likely subpleural lymph nodes including on images 62 and 103 of series 4. These are similar. A right lower lobe 4 mm pulmonary nodule on 61/4 is new. Musculoskeletal: No acute osseous abnormality. CT ABDOMEN PELVIS FINDINGS Hepatobiliary: Normal liver. Normal gallbladder, without biliary ductal dilatation. Pancreas: Normal, without mass or ductal dilatation. Spleen: Normal in size, without focal abnormality. Adrenals/Urinary Tract: Normal adrenal glands. Bilateral renal collecting system calculi of maximally 3-4 mm. Right renal lesions of up to 1.5 cm fluid density and consistent with cysts. There  are also too small to characterize left renal lesions . In the absence of clinically indicated signs/symptoms require(s) no independent follow-up. No hydronephrosis. Normal urinary bladder. Stomach/Bowel: Normal stomach, without wall thickening. Scattered colonic diverticula. Normal terminal ileum. Normal small bowel. Vascular/Lymphatic: Aortic atherosclerosis. No abdominopelvic adenopathy. Reproductive: Mild prostatomegaly. Other: No significant free fluid. No free intraperitoneal air. No evidence of omental or peritoneal disease. Musculoskeletal: No acute osseous abnormality. IMPRESSION: 1. Progressive radiation change throughout the left lung, without residual or recurrent disease. 2. New 4 mm right lower lobe pulmonary nodule is nonspecific. Recommend attention on  follow-up. Other right-sided pulmonary nodules are unchanged, favoring benign subpleural lymph nodes. 3. No acute process or evidence of metastatic disease in the abdomen or pelvis. 4. Bilateral nephrolithiasis 5. Prostatomegaly Electronically Signed   By: Abigail Miyamoto M.D.   On: 02/19/2022 08:28   CT Abdomen Pelvis W Contrast  Result Date: 02/19/2022 CLINICAL DATA:  Stage IV non-small-cell lung cancer. Superior segment left lower lobe lung mass with chest wall invasion. Diagnosed in March. Radiation therapy to left lower lobe lung mass. Systemic chemotherapy. * Tracking Code: BO * EXAM: CT CHEST, ABDOMEN, AND PELVIS WITH CONTRAST TECHNIQUE: Multidetector CT imaging of the chest, abdomen and pelvis was performed following the standard protocol during bolus administration of intravenous contrast. RADIATION DOSE REDUCTION: This exam was performed according to the departmental dose-optimization program which includes automated exposure control, adjustment of the mA and/or kV according to patient size and/or use of iterative reconstruction technique. CONTRAST:  150mL OMNIPAQUE IOHEXOL 300 MG/ML  SOLN COMPARISON:  11/29/2021 chest CT. Chest abdomen and pelvic CTs of 11/16/2021. FINDINGS: CT CHEST FINDINGS Cardiovascular: Right Port-A-Cath tip high right atrium. Aortic atherosclerosis. Tortuous thoracic aorta. Normal heart size, without pericardial effusion. Lad coronary artery calcification. No central pulmonary embolism, on this non-dedicated study. Mediastinum/Nodes: No supraclavicular adenopathy. No mediastinal or hilar adenopathy. Lungs/Pleura: No pleural fluid. Moderate centrilobular emphysema. Especially since 11/16/2021, progressive consolidation and traction bronchiectasis in the left upper lobe and adjacent superior segment left lower lobe. No well-defined residual or locally recurrent disease. Right lower lobe calcified pleural-based granuloma of 4 mm. Right upper and right middle lobe triangular shaped  nodules are likely subpleural lymph nodes including on images 62 and 103 of series 4. These are similar. A right lower lobe 4 mm pulmonary nodule on 61/4 is new. Musculoskeletal: No acute osseous abnormality. CT ABDOMEN PELVIS FINDINGS Hepatobiliary: Normal liver. Normal gallbladder, without biliary ductal dilatation. Pancreas: Normal, without mass or ductal dilatation. Spleen: Normal in size, without focal abnormality. Adrenals/Urinary Tract: Normal adrenal glands. Bilateral renal collecting system calculi of maximally 3-4 mm. Right renal lesions of up to 1.5 cm fluid density and consistent with cysts. There are also too small to characterize left renal lesions . In the absence of clinically indicated signs/symptoms require(s) no independent follow-up. No hydronephrosis. Normal urinary bladder. Stomach/Bowel: Normal stomach, without wall thickening. Scattered colonic diverticula. Normal terminal ileum. Normal small bowel. Vascular/Lymphatic: Aortic atherosclerosis. No abdominopelvic adenopathy. Reproductive: Mild prostatomegaly. Other: No significant free fluid. No free intraperitoneal air. No evidence of omental or peritoneal disease. Musculoskeletal: No acute osseous abnormality. IMPRESSION: 1. Progressive radiation change throughout the left lung, without residual or recurrent disease. 2. New 4 mm right lower lobe pulmonary nodule is nonspecific. Recommend attention on follow-up. Other right-sided pulmonary nodules are unchanged, favoring benign subpleural lymph nodes. 3. No acute process or evidence of metastatic disease in the abdomen or pelvis. 4. Bilateral nephrolithiasis 5. Prostatomegaly Electronically Signed   By: Marylyn Ishihara  Jobe Igo M.D.   On: 02/19/2022 08:28     ASSESSMENT/PLAN:  This is a very pleasant 76 year old Caucasian male with stage IV (T3, N1, M1 B) non-small cell lung cancer, favoring squamous cell carcinoma.  The patient presented with a superior segment left upper lobe lung mass with invasion  of the chest and destruction of the posterior left sixth and seventh ribs as well as left hilar adenopathy.  The patient has suspicious pleural metastases.  The patient was diagnosed in March 2023.   The patient underwent palliative radiation to left lower lobe lung mass and chest wall lesion under the care of Dr. Sondra Come.   He then underwent systemic chemotherapy with carboplatin for an AUC 5, paclitaxel 175 mg per metered squared, Libtayo 350 mg IV every 3 weeks.  He completed 4 cycles this was started on 07/18/2021.   He then started maintenance treatment with single agent Libtayo 350 mg IV every 3 weeks starting 10/10/2021.  Cycle 3 was delayed due to significant fatigue and weakness.  He was treated with a tapering dose of prednisone.  The patient was seen by me and Dr. Julien Nordmann on 01/22/2022, he was endorsing worsening weakness, diarrhea, hypotension, and epigastric discomfort.  Dr. Julien Nordmann restarted his prednisone taper. He completed this last week.   He recently had a restaging CT scan performed. Dr. Julien Nordmann personally and independently reviewed the scan and discussed the results with the patient. The scan showed no evidence of disease progression. There is a tiny 4 mm nodule that is nonspecific that will need to be monitored.   Dr. Julien Nordmann recommends that the patient continue on observation with close monitoring at this time. The patient does not tolerate treatment well.   I will arrange for repeat CT scan of the CAP in 3 months.   I will arrange for port-flush appointments.   The patient was instructed to take imodium for his diarrhea. Additionally, recommend he complete the GI pathogen panel and C. Diff testing from last month. If despite taking imodium and if he has persistent diarrhea, Dr. Julien Nordmann may consider referral to GI to r/o immunotherapy mediated colitis. Of note, his CT scan did not show any acute abnormality of his colon.   His potassium is a little low. I have sent a refill for  his potassium to the pharmacy to take 1 tablet 20 meq daily for 7 days.   He will see his urologist on 12/1. His blood pressure continues to be borderline low. Recommend that he talk to them about when it is safe to resume his Proscar.    The patient was advised to call immediately if he has any concerning symptoms in the interval. The patient voices understanding of current disease status and treatment options and is in agreement with the current care plan. All questions were answered. The patient knows to call the clinic with any problems, questions or concerns. We can certainly see the patient much sooner if necessary          Orders Placed This Encounter  Procedures   CT Chest W Contrast    Standing Status:   Future    Standing Expiration Date:   02/20/2023    Order Specific Question:   If indicated for the ordered procedure, I authorize the administration of contrast media per Radiology protocol    Answer:   Yes    Order Specific Question:   Does the patient have a contrast media/X-ray dye allergy?    Answer:   No  Order Specific Question:   Preferred imaging location?    Answer:   [facility]   CT Abdomen Pelvis W Contrast    Standing Status:   Future    Standing Expiration Date:   02/20/2023    Order Specific Question:   If indicated for the ordered procedure, I authorize the administration of contrast media per Radiology protocol    Answer:   Yes    Order Specific Question:   Does the patient have a contrast media/X-ray dye allergy?    Answer:   No    Order Specific Question:   Preferred imaging location?    Answer:   [facility]    Order Specific Question:   Is Oral Contrast requested for this exam?    Answer:   Yes, Per Radiology protocol   CBC with Differential ([facility] Only)    Standing Status:   Future    Standing Expiration Date:   02/21/2023   CMP ([facility] only)    Standing Status:   Future    Standing Expiration Date:    02/21/2023      Tobe Sos Boykin Baetz, PA-C 02/20/22  ADDENDUM: Hematology/Oncology Attending: I had a face-to-face encounter with the patient today.  I reviewed his record, lab, scan and recommended his care plan.  This is a very pleasant 76 years old white male with stage IV non-small cell lung cancer favoring squamous cell carcinoma diagnosed in March 2023.  The patient status post palliative radiotherapy to the left lower lobe lung mass as well as the chest wall lesion. He started treatment with systemic chemotherapy with carboplatin, paclitaxel and Libtayo (Cempilimab) for 4 cycles followed by maintenance treatment with Libtayo (Cempilimab) for additional 3 more cycles this was discontinued secondary to significant immunotherapy mediated colitis with persistent diarrhea, dehydration and weakness.  He was treated with a tapered dose of prednisone and started feeling much better. He is currently on observation with no treatment for several weeks.  He continues to have few episodes of diarrhea.  He did not take any antidiarrheal medication recently.  He will try some Imodium first and if no improvement we would consider The patient for additional studies and referral to gastroenterology. He had repeat CT scan of the chest, abdomen and pelvis performed recently.  I personally and independently reviewed the scan and discussed the result with the patient and his wife. His scan showed stable disease with no concerning finding for progression. I recommended for the patient to continue on observation with repeat CT scan of the chest in 3 months. The patient was advised to call immediately if he has any other concerning symptoms in the interval. The total time spent in the appointment was 30 minutes. Disclaimer: This note was dictated with voice recognition software. Similar sounding words can inadvertently be transcribed and may be missed upon review. Eilleen Kempf, MD

## 2022-02-18 ENCOUNTER — Ambulatory Visit (HOSPITAL_COMMUNITY)
Admission: RE | Admit: 2022-02-18 | Discharge: 2022-02-18 | Disposition: A | Discharge status: Home / Self Care | Payer: Medicare Other | Source: Ambulatory Visit | Attending: Physician Assistant | Admitting: Physician Assistant

## 2022-02-18 DIAGNOSIS — C3490 Malignant neoplasm of unspecified part of unspecified bronchus or lung: Secondary | ICD-10-CM | POA: Diagnosis not present

## 2022-02-18 DIAGNOSIS — J439 Emphysema, unspecified: Secondary | ICD-10-CM | POA: Diagnosis not present

## 2022-02-18 DIAGNOSIS — C3492 Malignant neoplasm of unspecified part of left bronchus or lung: Secondary | ICD-10-CM | POA: Insufficient documentation

## 2022-02-18 DIAGNOSIS — J479 Bronchiectasis, uncomplicated: Secondary | ICD-10-CM | POA: Diagnosis not present

## 2022-02-18 DIAGNOSIS — N2889 Other specified disorders of kidney and ureter: Secondary | ICD-10-CM | POA: Diagnosis not present

## 2022-02-18 MED ORDER — SODIUM CHLORIDE (PF) 0.9 % IJ SOLN
INTRAMUSCULAR | Status: AC
  Filled 2022-02-18: qty 50

## 2022-02-18 MED ORDER — IOHEXOL 300 MG/ML  SOLN
100.0000 mL | Freq: Once | INTRAMUSCULAR | Status: AC | PRN
  Administered 2022-02-18: 100 mL via INTRAVENOUS

## 2022-02-20 ENCOUNTER — Inpatient Hospital Stay (HOSPITAL_BASED_OUTPATIENT_CLINIC_OR_DEPARTMENT_OTHER): Payer: Medicare Other | Admitting: Physician Assistant

## 2022-02-20 ENCOUNTER — Inpatient Hospital Stay: Payer: Medicare Other | Attending: Internal Medicine

## 2022-02-20 ENCOUNTER — Other Ambulatory Visit: Payer: Self-pay

## 2022-02-20 VITALS — BP 103/61 | HR 80 | Temp 98.2°F | Resp 15 | Wt 140.0 lb

## 2022-02-20 DIAGNOSIS — C3492 Malignant neoplasm of unspecified part of left bronchus or lung: Secondary | ICD-10-CM

## 2022-02-20 DIAGNOSIS — R5383 Other fatigue: Secondary | ICD-10-CM

## 2022-02-20 DIAGNOSIS — E538 Deficiency of other specified B group vitamins: Secondary | ICD-10-CM

## 2022-02-20 DIAGNOSIS — C7951 Secondary malignant neoplasm of bone: Secondary | ICD-10-CM | POA: Insufficient documentation

## 2022-02-20 DIAGNOSIS — R197 Diarrhea, unspecified: Secondary | ICD-10-CM

## 2022-02-20 DIAGNOSIS — C3432 Malignant neoplasm of lower lobe, left bronchus or lung: Secondary | ICD-10-CM | POA: Insufficient documentation

## 2022-02-20 DIAGNOSIS — Z95828 Presence of other vascular implants and grafts: Secondary | ICD-10-CM

## 2022-02-20 LAB — CBC WITH DIFFERENTIAL (CANCER CENTER ONLY)
Abs Immature Granulocytes: 0.19 10*3/uL — ABNORMAL HIGH (ref 0.00–0.07)
Basophils Absolute: 0 10*3/uL (ref 0.0–0.1)
Basophils Relative: 1 %
Eosinophils Absolute: 0 10*3/uL (ref 0.0–0.5)
Eosinophils Relative: 2 %
HCT: 32.2 % — ABNORMAL LOW (ref 39.0–52.0)
Hemoglobin: 10.4 g/dL — ABNORMAL LOW (ref 13.0–17.0)
Immature Granulocytes: 8 %
Lymphocytes Relative: 14 %
Lymphs Abs: 0.4 10*3/uL — ABNORMAL LOW (ref 0.7–4.0)
MCH: 31.1 pg (ref 26.0–34.0)
MCHC: 32.3 g/dL (ref 30.0–36.0)
MCV: 96.4 fL (ref 80.0–100.0)
Monocytes Absolute: 0.3 10*3/uL (ref 0.1–1.0)
Monocytes Relative: 11 %
Neutro Abs: 1.6 10*3/uL — ABNORMAL LOW (ref 1.7–7.7)
Neutrophils Relative %: 64 %
Platelet Count: 97 10*3/uL — ABNORMAL LOW (ref 150–400)
RBC: 3.34 MIL/uL — ABNORMAL LOW (ref 4.22–5.81)
RDW: 17.3 % — ABNORMAL HIGH (ref 11.5–15.5)
Smear Review: NORMAL
WBC Count: 2.5 10*3/uL — ABNORMAL LOW (ref 4.0–10.5)
nRBC: 0 % (ref 0.0–0.2)

## 2022-02-20 LAB — CMP (CANCER CENTER ONLY)
ALT: 19 U/L (ref 0–44)
AST: 21 U/L (ref 15–41)
Albumin: 3.6 g/dL (ref 3.5–5.0)
Alkaline Phosphatase: 69 U/L (ref 38–126)
Anion gap: 6 (ref 5–15)
BUN: 17 mg/dL (ref 8–23)
CO2: 24 mmol/L (ref 22–32)
Calcium: 8.8 mg/dL — ABNORMAL LOW (ref 8.9–10.3)
Chloride: 108 mmol/L (ref 98–111)
Creatinine: 1.09 mg/dL (ref 0.61–1.24)
GFR, Estimated: >60 mL/min (ref >60)
Glucose, Bld: 83 mg/dL (ref 70–99)
Potassium: 3.2 mmol/L — ABNORMAL LOW (ref 3.5–5.1)
Sodium: 138 mmol/L (ref 135–145)
Total Bilirubin: 0.5 mg/dL (ref 0.3–1.2)
Total Protein: 6.2 g/dL — ABNORMAL LOW (ref 6.5–8.1)

## 2022-02-20 LAB — TSH: TSH: 2.412 u[IU]/mL (ref 0.350–4.500)

## 2022-02-20 LAB — VITAMIN B12: Vitamin B-12: 225 pg/mL (ref 180–914)

## 2022-02-20 MED ORDER — HEPARIN SOD (PORK) LOCK FLUSH 100 UNIT/ML IV SOLN
500.0000 [IU] | Freq: Once | INTRAVENOUS | Status: AC
  Administered 2022-02-20: 500 [IU]

## 2022-02-20 MED ORDER — POTASSIUM CHLORIDE CRYS ER 20 MEQ PO TBCR: 20.0000 meq | EXTENDED_RELEASE_TABLET | Freq: Every day | ORAL | 0 refills | Status: DC

## 2022-02-20 MED ORDER — SODIUM CHLORIDE 0.9% FLUSH
10.0000 mL | Freq: Once | INTRAVENOUS | Status: AC
  Administered 2022-02-20: 10 mL

## 2022-02-21 ENCOUNTER — Encounter: Payer: Self-pay | Admitting: Physician Assistant

## 2022-02-22 ENCOUNTER — Other Ambulatory Visit: Payer: Self-pay

## 2022-02-26 ENCOUNTER — Other Ambulatory Visit: Payer: Medicare Other

## 2022-02-26 ENCOUNTER — Ambulatory Visit: Payer: Medicare Other

## 2022-02-26 ENCOUNTER — Ambulatory Visit: Payer: Medicare Other | Admitting: Physician Assistant

## 2022-03-05 ENCOUNTER — Ambulatory Visit: Payer: Medicare Other

## 2022-03-05 ENCOUNTER — Other Ambulatory Visit: Payer: Medicare Other

## 2022-03-05 ENCOUNTER — Ambulatory Visit: Payer: Medicare Other | Admitting: Internal Medicine

## 2022-03-07 ENCOUNTER — Ambulatory Visit (INDEPENDENT_AMBULATORY_CARE_PROVIDER_SITE_OTHER): Payer: Medicare Other | Admitting: Urology

## 2022-03-07 ENCOUNTER — Encounter: Payer: Self-pay | Admitting: Urology

## 2022-03-07 VITALS — BP 124/73 | HR 94

## 2022-03-07 DIAGNOSIS — R351 Nocturia: Secondary | ICD-10-CM | POA: Diagnosis not present

## 2022-03-07 DIAGNOSIS — N401 Enlarged prostate with lower urinary tract symptoms: Secondary | ICD-10-CM

## 2022-03-07 LAB — URINALYSIS, ROUTINE W REFLEX MICROSCOPIC
Bilirubin, UA: NEGATIVE
Glucose, UA: NEGATIVE
Ketones, UA: NEGATIVE
Leukocytes,UA: NEGATIVE
Nitrite, UA: NEGATIVE
RBC, UA: NEGATIVE
Specific Gravity, UA: 1.015 (ref 1.005–1.030)
Urobilinogen, Ur: 0.2 mg/dL (ref 0.2–1.0)
pH, UA: 6.5 (ref 5.0–7.5)

## 2022-03-07 LAB — MICROSCOPIC EXAMINATION
Bacteria, UA: NONE SEEN
Epithelial Cells (non renal): NONE SEEN /hpf (ref 0–10)

## 2022-03-07 MED ORDER — FINASTERIDE 5 MG PO TABS: 5.0000 mg | ORAL_TABLET | Freq: Every day | ORAL | 3 refills | Status: DC

## 2022-03-07 NOTE — Progress Notes (Signed)
03/07/2022 10:19 AM   Edward Beck 04-04-1946 [id number]  Referring provider: [name], PA-C [place],  [place] [id number]  Followup BPH   HPI: Edward Beck a 79WE here for followup for BPH with Nocturia. 2 months ago he medications were stopped due to hypotension. Finasteride was stopped and his LUTS worsened. IPSS 11 QOL 3. Nocturia 2-3x.    PMH: Past Medical History:  Diagnosis Date   Condyloma acuminatum of scrotum    COVID 2021   December 2022 - mild   History of radiation therapy    Left Lung- 07/23/21-08/02/21- Dr. Gery Pray   Lung cancer [facility])    Pneumonia    Scrotal lesion     Surgical History: Past Surgical History:  Procedure Laterality Date   BRONCHIAL BIOPSY  07/02/2021   Procedure: BRONCHIAL BIOPSIES;  Surgeon: Garner Nash, DO;  Location: [facility];  Service: Pulmonary;;   BRONCHIAL BRUSHINGS  07/02/2021   Procedure: BRONCHIAL BRUSHINGS;  Surgeon: Garner Nash, DO;  Location: [facility];  Service: Pulmonary;;   BRONCHIAL NEEDLE ASPIRATION BIOPSY  07/02/2021   Procedure: BRONCHIAL NEEDLE ASPIRATION BIOPSIES;  Surgeon: Garner Nash, DO;  Location: [place];  Service: Pulmonary;;   HERNIA REPAIR  2009   left inguinal   IR IMAGING GUIDED PORT INSERTION  07/16/2021   SCROTAL EXPLORATION  11/21/2011   Procedure: SCROTUM EXPLORATION;  Surgeon: Hanley Ben, MD;  Location: [facility];  Service: Urology;  Laterality: N/A;  EXCISION, BIOPSY SCROTAL CONDYLOMA    VIDEO BRONCHOSCOPY WITH ENDOBRONCHIAL ULTRASOUND Bilateral 07/02/2021   Procedure: VIDEO BRONCHOSCOPY WITH ENDOBRONCHIAL ULTRASOUND;  Surgeon: Garner Nash, DO;  Location: [place];  Service: Pulmonary;  Laterality: Bilateral;    Home Medications:  Allergies as of 03/07/2022       Reactions   Tamiflu [oseltamivir Phosphate] Rash        Medication List        Accurate as of March 07, 2022 10:19 AM. If you have any questions, ask your nurse or  doctor.          Anoro Ellipta 62.5-25 MCG/ACT Aepb Generic drug: umeclidinium-vilanterol Inhale 1 puff into the lungs daily.   B-12 1000 MCG Tabs Take 1 tablet by mouth daily.   finasteride 5 MG tablet Commonly known as: PROSCAR Take 1 tablet (5 mg total) by mouth daily.   gabapentin 100 MG capsule Commonly known as: NEURONTIN Take 1 capsule (100 mg total) by mouth 3 (three) times daily. What changed:  when to take this additional instructions   lidocaine-prilocaine cream Commonly known as: EMLA Apply to the Port-A-Cath site 30 minutes before chemotherapy.   mirtazapine 15 MG tablet Commonly known as: Remeron Take 1 tablet (15 mg total) by mouth at bedtime.   potassium chloride SA 20 MEQ tablet Commonly known as: KLOR-CON M Take 1 tablet (20 mEq total) by mouth daily.   predniSONE 10 MG tablet Commonly known as: DELTASONE 6 tablet p.o. daily for 5 days followed by 5 tablet p.o. daily for 5 days followed by 4 tablet p.o. daily for 5 days followed by 2 tablets p.o. daily for 5 days followed by 1 tablet p.o. daily for 5 days.   prochlorperazine 10 MG tablet Commonly known as: COMPAZINE Take 1 tablet (10 mg total) by mouth every 6 (six) hours as needed for nausea or vomiting.   triamcinolone cream 0.1 % Commonly known as: KENALOG Apply 1 application  topically 2 (two) times daily as needed (  dry skin).        Allergies:  Allergies  Allergen Reactions   Tamiflu [Oseltamivir Phosphate] Rash    Family History: No family history on file.  Social History:  reports that he has been smoking cigarettes. He has a 50.00 pack-year smoking history. He does not have any smokeless tobacco history on file. He reports current alcohol use of about 1.0 standard drink of alcohol per week. He reports that he does not use drugs.  ROS: All other review of systems were reviewed and are negative except what is noted above in HPI  Physical Exam: BP 124/73   Pulse 94    Constitutional:  Alert and oriented, No acute distress. HEENT: [place] AT, moist mucus membranes.  Trachea midline, no masses. Cardiovascular: No clubbing, cyanosis, or edema. Respiratory: Normal respiratory effort, no increased work of breathing. GI: Abdomen is soft, nontender, nondistended, no abdominal masses GU: No CVA tenderness.  Lymph: No cervical or inguinal lymphadenopathy. Skin: No rashes, bruises or suspicious lesions. Neurologic: Grossly intact, no focal deficits, moving all 4 extremities. Psychiatric: Normal mood and affect.  Laboratory Data: Lab Results  Component Value Date   WBC 2.5 (L) 02/20/2022   HGB 10.4 (L) 02/20/2022   HCT 32.2 (L) 02/20/2022   MCV 96.4 02/20/2022   PLT 97 (L) 02/20/2022    Lab Results  Component Value Date   CREATININE 1.09 02/20/2022    No results found for: "PSA"  No results found for: "TESTOSTERONE"  No results found for: "HGBA1C"  Urinalysis    Component Value Date/Time   COLORURINE YELLOW 11/29/2021 [place] 11/29/2021 1735   APPEARANCEUR Clear 09/24/2021 0844   LABSPEC 1.010 11/29/2021 1735   PHURINE 7.0 11/29/2021 1735   GLUCOSEU NEGATIVE 11/29/2021 1735   HGBUR NEGATIVE 11/29/2021 1735   BILIRUBINUR NEGATIVE 11/29/2021 1735   BILIRUBINUR Negative 09/24/2021 [place] 11/29/2021 1735   PROTEINUR 30 (A) 11/29/2021 1735   NITRITE NEGATIVE 11/29/2021 1735   LEUKOCYTESUR NEGATIVE 11/29/2021 1735    Lab Results  Component Value Date   LABMICR See below: 09/24/2021   WBCUA 0-5 09/24/2021   LABEPIT 0-10 09/24/2021   MUCUS Present (A) 09/24/2021   BACTERIA NONE SEEN 11/29/2021    Pertinent Imaging:  No results found for this or any previous visit.  No results found for this or any previous visit.  No results found for this or any previous visit.  No results found for this or any previous visit.  No results found for this or any previous visit.  No valid procedures specified. No  results found for this or any previous visit.  No results found for this or any previous visit.   Assessment & Plan:    1. Benign localized prostatic hyperplasia with lower urinary tract symptoms (LUTS) Finasteride 5mg  daily - Urinalysis, Routine w reflex microscopic  2. Nocturia Finasteride 5mg  daily   No follow-ups on file.  Nicolette Bang, MD  [facility] [place]

## 2022-03-07 NOTE — Patient Instructions (Signed)

## 2022-03-08 ENCOUNTER — Other Ambulatory Visit: Payer: Self-pay

## 2022-03-11 ENCOUNTER — Telehealth: Payer: Self-pay

## 2022-03-11 ENCOUNTER — Other Ambulatory Visit: Payer: Self-pay

## 2022-03-11 ENCOUNTER — Other Ambulatory Visit (HOSPITAL_COMMUNITY)
Admission: RE | Admit: 2022-03-11 | Discharge: 2022-03-11 | Disposition: A | Discharge status: Home / Self Care | Payer: Medicare Other | Attending: Physician Assistant | Admitting: Physician Assistant

## 2022-03-11 DIAGNOSIS — C3492 Malignant neoplasm of unspecified part of left bronchus or lung: Secondary | ICD-10-CM | POA: Insufficient documentation

## 2022-03-11 DIAGNOSIS — R197 Diarrhea, unspecified: Secondary | ICD-10-CM | POA: Diagnosis not present

## 2022-03-11 LAB — C DIFFICILE QUICK SCREEN W PCR REFLEX
C Diff antigen: NEGATIVE
C Diff interpretation: NOT DETECTED
C Diff toxin: NEGATIVE

## 2022-03-12 LAB — GASTROINTESTINAL PANEL BY PCR, STOOL (REPLACES STOOL CULTURE)

## 2022-03-17 ENCOUNTER — Encounter: Payer: Self-pay | Admitting: Physician Assistant

## 2022-03-17 ENCOUNTER — Other Ambulatory Visit: Payer: Self-pay | Admitting: Physician Assistant

## 2022-03-17 DIAGNOSIS — R197 Diarrhea, unspecified: Secondary | ICD-10-CM

## 2022-03-17 MED ORDER — DIPHENOXYLATE-ATROPINE 2.5-0.025 MG PO TABS: 1.0000 | ORAL_TABLET | Freq: Four times a day (QID) | ORAL | 1 refills | Status: DC | PRN

## 2022-03-17 NOTE — Telephone Encounter (Signed)
This nurse reached out to patient and made aware of Negative test results on his stool samples.  Advised that the provider will be calling in a prescription for Lomotil to [facility] drug and referring him to GI provider. This nurse encouraged patient to make sure he drinks plenty fluids to prevent dehydration.  Patient acknowledged understanding.  No further questions or concerns noted at this time.

## 2022-03-18 NOTE — Progress Notes (Signed)
New Patient referral faxed to [facility], includes last office notes, lab reports and patient demographics.  Fax number: [phone].  Patient is aware.

## 2022-03-21 ENCOUNTER — Other Ambulatory Visit: Payer: Medicare Other

## 2022-03-21 DIAGNOSIS — R197 Diarrhea, unspecified: Secondary | ICD-10-CM | POA: Diagnosis not present

## 2022-03-28 ENCOUNTER — Ambulatory Visit: Payer: Medicare Other | Admitting: Urology

## 2022-04-02 ENCOUNTER — Other Ambulatory Visit: Payer: Self-pay | Admitting: Physician Assistant

## 2022-04-02 DIAGNOSIS — R197 Diarrhea, unspecified: Secondary | ICD-10-CM

## 2022-04-17 ENCOUNTER — Inpatient Hospital Stay: Payer: Medicare Other

## 2022-04-17 ENCOUNTER — Other Ambulatory Visit: Payer: Self-pay | Admitting: Physician Assistant

## 2022-04-17 ENCOUNTER — Other Ambulatory Visit: Payer: Self-pay

## 2022-04-17 ENCOUNTER — Inpatient Hospital Stay: Payer: Medicare Other | Attending: Internal Medicine

## 2022-04-17 VITALS — BP 103/76 | HR 81 | Temp 97.9°F | Resp 18

## 2022-04-17 DIAGNOSIS — Z95828 Presence of other vascular implants and grafts: Secondary | ICD-10-CM

## 2022-04-17 DIAGNOSIS — Z79899 Other long term (current) drug therapy: Secondary | ICD-10-CM | POA: Diagnosis not present

## 2022-04-17 DIAGNOSIS — E876 Hypokalemia: Secondary | ICD-10-CM | POA: Insufficient documentation

## 2022-04-17 DIAGNOSIS — R5383 Other fatigue: Secondary | ICD-10-CM

## 2022-04-17 DIAGNOSIS — C3492 Malignant neoplasm of unspecified part of left bronchus or lung: Secondary | ICD-10-CM

## 2022-04-17 DIAGNOSIS — C3432 Malignant neoplasm of lower lobe, left bronchus or lung: Secondary | ICD-10-CM | POA: Diagnosis not present

## 2022-04-17 LAB — CMP (CANCER CENTER ONLY)
ALT: 7 U/L (ref 0–44)
AST: 14 U/L — ABNORMAL LOW (ref 15–41)
Albumin: 3.3 g/dL — ABNORMAL LOW (ref 3.5–5.0)
Alkaline Phosphatase: 82 U/L (ref 38–126)
Anion gap: 7 (ref 5–15)
BUN: 15 mg/dL (ref 8–23)
CO2: 26 mmol/L (ref 22–32)
Calcium: 9.2 mg/dL (ref 8.9–10.3)
Chloride: 109 mmol/L (ref 98–111)
Creatinine: 1.09 mg/dL (ref 0.61–1.24)
GFR, Estimated: >60 mL/min (ref >60)
Glucose, Bld: 87 mg/dL (ref 70–99)
Potassium: 2.2 mmol/L — CL (ref 3.5–5.1)
Sodium: 142 mmol/L (ref 135–145)
Total Bilirubin: 0.4 mg/dL (ref 0.3–1.2)
Total Protein: 6.3 g/dL — ABNORMAL LOW (ref 6.5–8.1)

## 2022-04-17 LAB — CBC WITH DIFFERENTIAL (CANCER CENTER ONLY)
Abs Immature Granulocytes: 0.25 10*3/uL — ABNORMAL HIGH (ref 0.00–0.07)
Basophils Absolute: 0.1 10*3/uL (ref 0.0–0.1)
Basophils Relative: 1 %
Eosinophils Absolute: 0.1 10*3/uL (ref 0.0–0.5)
Eosinophils Relative: 2 %
HCT: 36.4 % — ABNORMAL LOW (ref 39.0–52.0)
Hemoglobin: 12.5 g/dL — ABNORMAL LOW (ref 13.0–17.0)
Immature Granulocytes: 3 %
Lymphocytes Relative: 13 %
Lymphs Abs: 1 10*3/uL (ref 0.7–4.0)
MCH: 30.8 pg (ref 26.0–34.0)
MCHC: 34.3 g/dL (ref 30.0–36.0)
MCV: 89.7 fL (ref 80.0–100.0)
Monocytes Absolute: 0.7 10*3/uL (ref 0.1–1.0)
Monocytes Relative: 9 %
Neutro Abs: 5.8 10*3/uL (ref 1.7–7.7)
Neutrophils Relative %: 72 %
Platelet Count: 240 10*3/uL (ref 150–400)
RBC: 4.06 MIL/uL — ABNORMAL LOW (ref 4.22–5.81)
RDW: 14 % (ref 11.5–15.5)
WBC Count: 8 10*3/uL (ref 4.0–10.5)
nRBC: 0 % (ref 0.0–0.2)

## 2022-04-17 LAB — TSH: TSH: 0.814 u[IU]/mL (ref 0.350–4.500)

## 2022-04-17 MED ORDER — POTASSIUM CHLORIDE 10 MEQ/100ML IV SOLN
10.0000 meq | INTRAVENOUS | Status: AC
  Administered 2022-04-17 (×2): 10 meq via INTRAVENOUS
  Filled 2022-04-17 (×2): qty 100

## 2022-04-17 MED ORDER — SODIUM CHLORIDE 0.9% FLUSH
10.0000 mL | Freq: Once | INTRAVENOUS | Status: AC
  Administered 2022-04-17: 10 mL

## 2022-04-17 MED ORDER — HEPARIN SOD (PORK) LOCK FLUSH 100 UNIT/ML IV SOLN
500.0000 [IU] | Freq: Once | INTRAVENOUS | Status: AC
  Administered 2022-04-17: 500 [IU]

## 2022-04-17 MED ORDER — SODIUM CHLORIDE 0.9 % IV SOLN: INTRAVENOUS | Status: DC

## 2022-04-17 MED ORDER — POTASSIUM CHLORIDE CRYS ER 20 MEQ PO TBCR: 20.0000 meq | EXTENDED_RELEASE_TABLET | Freq: Two times a day (BID) | ORAL | 0 refills | Status: DC

## 2022-04-17 NOTE — Patient Instructions (Addendum)
Potassium Chloride Injection What is this medication? POTASSIUM CHLORIDE (poe TASS i um KLOOR ide) prevents and treats low levels of potassium in your body. Potassium plays an important role in maintaining the health of your kidneys, heart, muscles, and nervous system. This medicine may be used for other purposes; ask your health care provider or pharmacist if you have questions. COMMON BRAND NAME(S): PROAMP What should I tell my care team before I take this medication? They need to know if you have any of these conditions: Addison disease Dehydration Diabetes (high blood sugar) Heart disease High levels of potassium in the blood Irregular heartbeat or rhythm Kidney disease Large areas of burned skin An unusual or allergic reaction to potassium, other medications, foods, dyes, or preservatives Pregnant or trying to get pregnant Breast-feeding How should I use this medication? This medication is injected into a vein. It is given in a hospital or clinic setting. Talk to your care team about the use of this medication in children. Special care may be needed. Overdosage: If you think you have taken too much of this medicine contact a poison control center or emergency room at once. NOTE: This medicine is only for you. Do not share this medicine with others. What if I miss a dose? This does not apply. This medication is not for regular use. What may interact with this medication? Do not take this medication with any of the following: Certain diuretics, such as spironolactone, triamterene Eplerenone Sodium polystyrene sulfonate This medication may also interact with the following: Certain medications for blood pressure or heart disease, such as lisinopril, losartan, quinapril, valsartan Medications that lower your chance of fighting infection, such as cyclosporine, tacrolimus NSAIDs, medications for pain and inflammation, such as ibuprofen or naproxen Other potassium supplements Salt  substitutes This list may not describe all possible interactions. Give your health care provider a list of all the medicines, herbs, non-prescription drugs, or dietary supplements you use. Also tell them if you smoke, drink alcohol, or use illegal drugs. Some items may interact with your medicine. What should I watch for while using this medication? Visit your care team for regular checks on your progress. Tell your care team if your symptoms do not start to get better or if they get worse. You may need blood work while you are taking this medication. Avoid salt substitutes unless you are told otherwise by your care team. What side effects may I notice from receiving this medication? Side effects that you should report to your care team as soon as possible: Allergic reactions--skin rash, itching, hives, swelling of the face, lips, tongue, or throat High potassium level--muscle weakness, fast or irregular heartbeat Side effects that usually do not require medical attention (report to your care team if they continue or are bothersome): Diarrhea Nausea Stomach pain Vomiting This list may not describe all possible side effects. Call your doctor for medical advice about side effects. You may report side effects to FDA at 1-800-FDA-1088. Where should I keep my medication? This medication is given in a hospital or clinic. It will not be stored at home. NOTE: This sheet is a summary. It may not cover all possible information. If you have questions about this medicine, talk to your doctor, pharmacist, or health care provider.  2023 Elsevier/Gold Standard (2020-07-05 00:00:00)

## 2022-04-25 DIAGNOSIS — E039 Hypothyroidism, unspecified: Secondary | ICD-10-CM | POA: Diagnosis not present

## 2022-04-25 DIAGNOSIS — E7849 Other hyperlipidemia: Secondary | ICD-10-CM | POA: Diagnosis not present

## 2022-04-25 DIAGNOSIS — E876 Hypokalemia: Secondary | ICD-10-CM | POA: Diagnosis not present

## 2022-04-25 DIAGNOSIS — R7301 Impaired fasting glucose: Secondary | ICD-10-CM | POA: Diagnosis not present

## 2022-04-25 DIAGNOSIS — R030 Elevated blood-pressure reading, without diagnosis of hypertension: Secondary | ICD-10-CM | POA: Diagnosis not present

## 2022-04-25 DIAGNOSIS — Z0001 Encounter for general adult medical examination with abnormal findings: Secondary | ICD-10-CM | POA: Diagnosis not present

## 2022-04-29 DIAGNOSIS — Z720 Tobacco use: Secondary | ICD-10-CM | POA: Diagnosis not present

## 2022-04-29 DIAGNOSIS — E876 Hypokalemia: Secondary | ICD-10-CM | POA: Diagnosis not present

## 2022-04-29 DIAGNOSIS — C3432 Malignant neoplasm of lower lobe, left bronchus or lung: Secondary | ICD-10-CM | POA: Diagnosis not present

## 2022-04-29 DIAGNOSIS — N401 Enlarged prostate with lower urinary tract symptoms: Secondary | ICD-10-CM | POA: Diagnosis not present

## 2022-04-29 DIAGNOSIS — E7849 Other hyperlipidemia: Secondary | ICD-10-CM | POA: Diagnosis not present

## 2022-04-29 DIAGNOSIS — Z0001 Encounter for general adult medical examination with abnormal findings: Secondary | ICD-10-CM | POA: Diagnosis not present

## 2022-04-29 DIAGNOSIS — Z681 Body mass index (BMI) 19 or less, adult: Secondary | ICD-10-CM | POA: Diagnosis not present

## 2022-04-29 DIAGNOSIS — R030 Elevated blood-pressure reading, without diagnosis of hypertension: Secondary | ICD-10-CM | POA: Diagnosis not present

## 2022-05-01 DIAGNOSIS — K5289 Other specified noninfective gastroenteritis and colitis: Secondary | ICD-10-CM | POA: Diagnosis not present

## 2022-05-01 DIAGNOSIS — K529 Noninfective gastroenteritis and colitis, unspecified: Secondary | ICD-10-CM | POA: Diagnosis not present

## 2022-05-01 DIAGNOSIS — K649 Unspecified hemorrhoids: Secondary | ICD-10-CM | POA: Diagnosis not present

## 2022-05-01 DIAGNOSIS — K5730 Diverticulosis of large intestine without perforation or abscess without bleeding: Secondary | ICD-10-CM | POA: Diagnosis not present

## 2022-05-09 DIAGNOSIS — K529 Noninfective gastroenteritis and colitis, unspecified: Secondary | ICD-10-CM | POA: Diagnosis not present

## 2022-05-22 ENCOUNTER — Other Ambulatory Visit: Payer: Self-pay | Admitting: *Deleted

## 2022-05-22 ENCOUNTER — Other Ambulatory Visit: Payer: Self-pay | Admitting: Physician Assistant

## 2022-05-22 ENCOUNTER — Telehealth: Payer: Self-pay

## 2022-05-22 ENCOUNTER — Other Ambulatory Visit: Payer: Self-pay

## 2022-05-22 ENCOUNTER — Other Ambulatory Visit: Payer: Medicare Other

## 2022-05-22 ENCOUNTER — Encounter: Payer: Self-pay | Admitting: *Deleted

## 2022-05-22 ENCOUNTER — Ambulatory Visit (HOSPITAL_COMMUNITY)
Admission: RE | Admit: 2022-05-22 | Discharge: 2022-05-22 | Disposition: A | Discharge status: Home / Self Care | Payer: Medicare Other | Source: Ambulatory Visit | Attending: Physician Assistant | Admitting: Physician Assistant

## 2022-05-22 ENCOUNTER — Inpatient Hospital Stay: Payer: Medicare Other | Attending: Internal Medicine

## 2022-05-22 ENCOUNTER — Inpatient Hospital Stay: Payer: Medicare Other

## 2022-05-22 VITALS — BP 129/76 | HR 65 | Temp 97.8°F | Resp 18

## 2022-05-22 DIAGNOSIS — Z9221 Personal history of antineoplastic chemotherapy: Secondary | ICD-10-CM | POA: Insufficient documentation

## 2022-05-22 DIAGNOSIS — Z95828 Presence of other vascular implants and grafts: Secondary | ICD-10-CM

## 2022-05-22 DIAGNOSIS — R197 Diarrhea, unspecified: Secondary | ICD-10-CM

## 2022-05-22 DIAGNOSIS — R5383 Other fatigue: Secondary | ICD-10-CM | POA: Diagnosis not present

## 2022-05-22 DIAGNOSIS — E876 Hypokalemia: Secondary | ICD-10-CM

## 2022-05-22 DIAGNOSIS — J479 Bronchiectasis, uncomplicated: Secondary | ICD-10-CM | POA: Diagnosis not present

## 2022-05-22 DIAGNOSIS — N200 Calculus of kidney: Secondary | ICD-10-CM | POA: Diagnosis not present

## 2022-05-22 DIAGNOSIS — Z85118 Personal history of other malignant neoplasm of bronchus and lung: Secondary | ICD-10-CM | POA: Insufficient documentation

## 2022-05-22 DIAGNOSIS — Z923 Personal history of irradiation: Secondary | ICD-10-CM | POA: Insufficient documentation

## 2022-05-22 DIAGNOSIS — C3492 Malignant neoplasm of unspecified part of left bronchus or lung: Secondary | ICD-10-CM

## 2022-05-22 DIAGNOSIS — C3490 Malignant neoplasm of unspecified part of unspecified bronchus or lung: Secondary | ICD-10-CM | POA: Diagnosis not present

## 2022-05-22 LAB — CMP (CANCER CENTER ONLY)
ALT: 10 U/L (ref 0–44)
AST: 20 U/L (ref 15–41)
Albumin: 3 g/dL — ABNORMAL LOW (ref 3.5–5.0)
Alkaline Phosphatase: 53 U/L (ref 38–126)
Anion gap: 7 (ref 5–15)
BUN: 15 mg/dL (ref 8–23)
CO2: 24 mmol/L (ref 22–32)
Calcium: 8.8 mg/dL — ABNORMAL LOW (ref 8.9–10.3)
Chloride: 110 mmol/L (ref 98–111)
Creatinine: 1.27 mg/dL — ABNORMAL HIGH (ref 0.61–1.24)
GFR, Estimated: 58 mL/min — ABNORMAL LOW (ref >60)
Glucose, Bld: 96 mg/dL (ref 70–99)
Potassium: 2.7 mmol/L — CL (ref 3.5–5.1)
Sodium: 141 mmol/L (ref 135–145)
Total Bilirubin: 0.5 mg/dL (ref 0.3–1.2)
Total Protein: 5.9 g/dL — ABNORMAL LOW (ref 6.5–8.1)

## 2022-05-22 LAB — CBC WITH DIFFERENTIAL (CANCER CENTER ONLY)
Abs Immature Granulocytes: 0.06 10*3/uL (ref 0.00–0.07)
Basophils Absolute: 0.1 10*3/uL (ref 0.0–0.1)
Basophils Relative: 2 %
Eosinophils Absolute: 0.2 10*3/uL (ref 0.0–0.5)
Eosinophils Relative: 2 %
HCT: 31 % — ABNORMAL LOW (ref 39.0–52.0)
Hemoglobin: 10.3 g/dL — ABNORMAL LOW (ref 13.0–17.0)
Immature Granulocytes: 1 %
Lymphocytes Relative: 10 %
Lymphs Abs: 0.7 10*3/uL (ref 0.7–4.0)
MCH: 30.9 pg (ref 26.0–34.0)
MCHC: 33.2 g/dL (ref 30.0–36.0)
MCV: 93.1 fL (ref 80.0–100.0)
Monocytes Absolute: 0.6 10*3/uL (ref 0.1–1.0)
Monocytes Relative: 8 %
Neutro Abs: 5.8 10*3/uL (ref 1.7–7.7)
Neutrophils Relative %: 77 %
Platelet Count: 207 10*3/uL (ref 150–400)
RBC: 3.33 MIL/uL — ABNORMAL LOW (ref 4.22–5.81)
RDW: 14.8 % (ref 11.5–15.5)
WBC Count: 7.5 10*3/uL (ref 4.0–10.5)
nRBC: 0 % (ref 0.0–0.2)

## 2022-05-22 LAB — TSH: TSH: 1.554 u[IU]/mL (ref 0.350–4.500)

## 2022-05-22 LAB — MAGNESIUM: Magnesium: 1.9 mg/dL (ref 1.7–2.4)

## 2022-05-22 MED ORDER — HEPARIN SOD (PORK) LOCK FLUSH 100 UNIT/ML IV SOLN
500.0000 [IU] | Freq: Once | INTRAVENOUS | Status: AC
  Administered 2022-05-22: 500 [IU]

## 2022-05-22 MED ORDER — PREDNISONE 10 MG PO TABS: ORAL_TABLET | ORAL | 0 refills | Status: DC

## 2022-05-22 MED ORDER — SODIUM CHLORIDE (PF) 0.9 % IJ SOLN
INTRAMUSCULAR | Status: AC
  Filled 2022-05-22: qty 50

## 2022-05-22 MED ORDER — POTASSIUM CHLORIDE CRYS ER 20 MEQ PO TBCR: 20.0000 meq | EXTENDED_RELEASE_TABLET | Freq: Two times a day (BID) | ORAL | 0 refills | Status: DC

## 2022-05-22 MED ORDER — IOHEXOL 300 MG/ML  SOLN
100.0000 mL | Freq: Once | INTRAMUSCULAR | Status: AC | PRN
  Administered 2022-05-22: 100 mL via INTRAVENOUS

## 2022-05-22 MED ORDER — SODIUM CHLORIDE 0.9% FLUSH
10.0000 mL | Freq: Once | INTRAVENOUS | Status: AC
  Administered 2022-05-22: 10 mL

## 2022-05-22 MED ORDER — SODIUM CHLORIDE 0.9 % IV SOLN: INTRAVENOUS | Status: DC

## 2022-05-22 MED ORDER — POTASSIUM CHLORIDE 10 MEQ/100ML IV SOLN
10.0000 meq | INTRAVENOUS | Status: AC
  Administered 2022-05-22 (×4): 10 meq via INTRAVENOUS
  Filled 2022-05-22 (×4): qty 100

## 2022-05-22 MED ORDER — OMEPRAZOLE 20 MG PO CPDR: 20.0000 mg | DELAYED_RELEASE_CAPSULE | Freq: Every day | ORAL | 2 refills | Status: DC

## 2022-05-22 NOTE — Telephone Encounter (Signed)
CRITICAL VALUE STICKER  CRITICAL VALUE:  Potassium  2.7  RECEIVER (on-site recipient of call):  Rondel Baton, LPN  [place] NOTIFIED: 05/22/22   10:18  MESSENGER (representative from lab):  Lelan Pons  MD NOTIFIED:   Rubin Payor Heilingoetter, PA  TIME OF NOTIFICATION: 10:21  RESPONSE:

## 2022-05-22 NOTE — Progress Notes (Unsigned)
Critical potassium 2.7 called to Rondel Baton, RN at 1020 by Prudencio Burly

## 2022-05-22 NOTE — Progress Notes (Signed)
The patient's labs today showed significant hypokalemia with a potassium low at 2.7.  I called the patient's wife and they are currently in the CT department.  After they are finished with the CT today, they will come to the cancer center for IV potassium.  I will also add on a magnesium to his labs today.  I called the patient and it turns out he is still having significant diarrhea.  Dr. Paulita Fujita did a colonoscopy last week.  I discussed with Dr. Julien Nordmann who received a message from Dr. 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  The patient was already on high-dose steroids.  Dr. Julien Nordmann recommended treatment with infliximab.  The patient's wife states that they were not started on any new medications.  Therefore, I will restart a high-dose prednisone taper and reach out to GI about status update for infliximab.

## 2022-05-22 NOTE — Patient Instructions (Signed)
Potassium Chloride Injection What is this medication? POTASSIUM CHLORIDE (poe TASS i um KLOOR ide) prevents and treats low levels of potassium in your body. Potassium plays an important role in maintaining the health of your kidneys, heart, muscles, and nervous system. This medicine may be used for other purposes; ask your health care provider or pharmacist if you have questions. COMMON BRAND NAME(S): PROAMP What should I tell my care team before I take this medication? They need to know if you have any of these conditions: Addison disease Dehydration Diabetes (high blood sugar) Heart disease High levels of potassium in the blood Irregular heartbeat or rhythm Kidney disease Large areas of burned skin An unusual or allergic reaction to potassium, other medications, foods, dyes, or preservatives Pregnant or trying to get pregnant Breast-feeding How should I use this medication? This medication is injected into a vein. It is given in a hospital or clinic setting. Talk to your care team about the use of this medication in children. Special care may be needed. Overdosage: If you think you have taken too much of this medicine contact a poison control center or emergency room at once. NOTE: This medicine is only for you. Do not share this medicine with others. What if I miss a dose? This does not apply. This medication is not for regular use. What may interact with this medication? Do not take this medication with any of the following: Certain diuretics, such as spironolactone, triamterene Eplerenone Sodium polystyrene sulfonate This medication may also interact with the following: Certain medications for blood pressure or heart disease, such as lisinopril, losartan, quinapril, valsartan Medications that lower your chance of fighting infection, such as cyclosporine, tacrolimus NSAIDs, medications for pain and inflammation, such as ibuprofen or naproxen Other potassium supplements Salt  substitutes This list may not describe all possible interactions. Give your health care provider a list of all the medicines, herbs, non-prescription drugs, or dietary supplements you use. Also tell them if you smoke, drink alcohol, or use illegal drugs. Some items may interact with your medicine. What should I watch for while using this medication? Visit your care team for regular checks on your progress. Tell your care team if your symptoms do not start to get better or if they get worse. You may need blood work while you are taking this medication. Avoid salt substitutes unless you are told otherwise by your care team. What side effects may I notice from receiving this medication? Side effects that you should report to your care team as soon as possible: Allergic reactions--skin rash, itching, hives, swelling of the face, lips, tongue, or throat High potassium level--muscle weakness, fast or irregular heartbeat Side effects that usually do not require medical attention (report to your care team if they continue or are bothersome): Diarrhea Nausea Stomach pain Vomiting This list may not describe all possible side effects. Call your doctor for medical advice about side effects. You may report side effects to FDA at 1-800-FDA-1088. Where should I keep my medication? This medication is given in a hospital or clinic. It will not be stored at home. NOTE: This sheet is a summary. It may not cover all possible information. If you have questions about this medicine, talk to your doctor, pharmacist, or health care provider.  2023 Elsevier/Gold Standard (2020-07-05 00:00:00)

## 2022-05-23 ENCOUNTER — Other Ambulatory Visit: Payer: Self-pay | Admitting: Physician Assistant

## 2022-05-26 ENCOUNTER — Inpatient Hospital Stay (HOSPITAL_BASED_OUTPATIENT_CLINIC_OR_DEPARTMENT_OTHER): Payer: Medicare Other | Admitting: Internal Medicine

## 2022-05-26 VITALS — BP 127/68 | HR 72 | Temp 98.2°F | Resp 16 | Wt 148.0 lb

## 2022-05-26 DIAGNOSIS — Z9221 Personal history of antineoplastic chemotherapy: Secondary | ICD-10-CM | POA: Diagnosis not present

## 2022-05-26 DIAGNOSIS — C3490 Malignant neoplasm of unspecified part of unspecified bronchus or lung: Secondary | ICD-10-CM

## 2022-05-26 DIAGNOSIS — Z923 Personal history of irradiation: Secondary | ICD-10-CM | POA: Diagnosis not present

## 2022-05-26 DIAGNOSIS — Z85118 Personal history of other malignant neoplasm of bronchus and lung: Secondary | ICD-10-CM | POA: Diagnosis not present

## 2022-05-26 NOTE — Progress Notes (Signed)
[facility] [phone]   [phone]  OFFICE PROGRESS NOTE  [name], PA-C [address] [place] [address]  DIAGNOSIS: Stage IV (T3, N1, M1b) non-small cell lung cancer favoring squamous cell carcinoma presented with superior segment left lower lobe lung mass with invasion of the chest wall and destruction of the posterior left sixth and seventh ribs as well as left hilar adenopathy and suspicious pleural metastasis diagnosed in March 2023  PRIOR THERAPY:  1) Palliative radiotherapy to the left lower lobe lung mass with chest wall invasion under the care of Dr. Sondra Come. 2) Palliative systemic chemotherapy with carboplatin for AUC of 5, paclitaxel 175 Mg/M2 and Libtayo (Cempilimab) 350 Mg IV every 3 weeks with Neulasta support.  First dose was given on July 18, 2021.  Status post 4 cycles. 3) Maintenance treatment with Libtayo (Cempilimab) 350 Mg IV every 3 weeks, first dose October 10, 2021 status post 3 cycles.  Discontinued secondary to intolerance with immunotherapy mediated colitis and persistent diarrhea.  CURRENT THERAPY: Observation.   INTERVAL HISTORY: Edward Beck 77 y.o. male returns to the clinic today for follow-up visit accompanied by his wife.  The patient is feeling fine today with no concerning complaints.  His diarrhea as well as the itching has significantly improved in the last few days after his starting the treatment with the high-dose steroids.  He denied having any current chest pain, shortness of breath except with exertion with no cough or hemoptysis.  He has no nausea, vomiting, current diarrhea or constipation.  He has no abdominal pain.  He has no fever or chills.  He gained few pounds since his last visit.  He was seen by Dr. Paulita Fujita and had a colonoscopy that showed suspicious immunotherapy mediated colitis.  The patient had repeat CT scan of the chest, abdomen and pelvis performed recently and he is here for  evaluation and discussion of his scan results.  MEDICAL HISTORY: Past Medical History:  Diagnosis Date   Condyloma acuminatum of scrotum    COVID 2021   December 2022 - mild   History of radiation therapy    Left Lung- 07/23/21-08/02/21- Dr. Gery Pray   Lung cancer [facility])    Pneumonia    Scrotal lesion     ALLERGIES:  is allergic to tamiflu [oseltamivir phosphate].  MEDICATIONS:  Current Outpatient Medications  Medication Sig Dispense Refill   omeprazole (PRILOSEC) 20 MG capsule Take 1 capsule (20 mg total) by mouth daily. 30 capsule 2   Cyanocobalamin (B-12) 1000 MCG TABS Take 1 tablet by mouth daily.     diphenoxylate-atropine (LOMOTIL) 2.5-0.025 MG tablet TAKE 1 TABLET BY MOUTH FOUR TIMES DAILY AS NEEDED FOR DIARRHEA OR LOOSE STOOLS 30 tablet 1   finasteride (PROSCAR) 5 MG tablet Take 1 tablet (5 mg total) by mouth daily. 90 tablet 3   gabapentin (NEURONTIN) 100 MG capsule Take 1 capsule (100 mg total) by mouth 3 (three) times daily. (Patient taking differently: Take 100 mg by mouth daily. Once a day per patient) 90 capsule 2   lidocaine-prilocaine (EMLA) cream Apply to the Port-A-Cath site 30 minutes before chemotherapy. 30 g 0   mirtazapine (REMERON) 15 MG tablet Take 1 tablet (15 mg total) by mouth at bedtime. 30 tablet 2   potassium chloride SA (KLOR-CON M) 20 MEQ tablet Take 1 tablet (20 mEq total) by mouth 2 (two) times daily. 14 tablet 0   predniSONE (DELTASONE) 10 MG tablet 6  tablet p.o. daily for 7 days followed by 5 tablet p.o. daily for 7 days followed by 4 tablet p.o. daily for 7 days followed by 2 tablets p.o. daily for 7 days followed by 1 tablet p.o. daily for 7 days. 150 tablet 0   prochlorperazine (COMPAZINE) 10 MG tablet Take 1 tablet (10 mg total) by mouth every 6 (six) hours as needed for nausea or vomiting. 30 tablet 0   triamcinolone cream (KENALOG) 0.1 % Apply 1 application  topically 2 (two) times daily as needed (dry skin).     umeclidinium-vilanterol  (ANORO ELLIPTA) 62.5-25 MCG/ACT AEPB Inhale 1 puff into the lungs daily. 7 each 0   No current facility-administered medications for this visit.    SURGICAL HISTORY:  Past Surgical History:  Procedure Laterality Date   BRONCHIAL BIOPSY  07/02/2021   Procedure: BRONCHIAL BIOPSIES;  Surgeon: Garner Nash, DO;  Location: [place] [facility];  Service: Pulmonary;;   BRONCHIAL BRUSHINGS  07/02/2021   Procedure: BRONCHIAL BRUSHINGS;  Surgeon: Garner Nash, DO;  Location: [place] [facility];  Service: Pulmonary;;   BRONCHIAL NEEDLE ASPIRATION BIOPSY  07/02/2021   Procedure: BRONCHIAL NEEDLE ASPIRATION BIOPSIES;  Surgeon: Garner Nash, DO;  Location: [place];  Service: Pulmonary;;   HERNIA REPAIR  2009   left inguinal   IR IMAGING GUIDED PORT INSERTION  07/16/2021   SCROTAL EXPLORATION  11/21/2011   Procedure: SCROTUM EXPLORATION;  Surgeon: Hanley Ben, MD;  Location: [facility];  Service: Urology;  Laterality: N/A;  EXCISION, BIOPSY SCROTAL CONDYLOMA    VIDEO BRONCHOSCOPY WITH ENDOBRONCHIAL ULTRASOUND Bilateral 07/02/2021   Procedure: VIDEO BRONCHOSCOPY WITH ENDOBRONCHIAL ULTRASOUND;  Surgeon: Garner Nash, DO;  Location: [place];  Service: Pulmonary;  Laterality: Bilateral;    REVIEW OF SYSTEMS:  Constitutional: positive for fatigue Eyes: negative Ears, nose, mouth, throat, and face: negative Respiratory: positive for dyspnea on exertion Cardiovascular: negative Gastrointestinal: negative Genitourinary:negative Integument/breast: negative Hematologic/lymphatic: negative Musculoskeletal:negative Neurological: negative Behavioral/Psych: negative Endocrine: negative Allergic/Immunologic: negative   PHYSICAL EXAMINATION: General appearance: alert, cooperative, fatigued, and no distress Head: Normocephalic, without obvious abnormality, atraumatic Neck: no adenopathy, no JVD, supple, symmetrical, trachea midline, and thyroid not enlarged, symmetric, no  tenderness/mass/nodules Lymph nodes: Cervical, supraclavicular, and axillary nodes normal. Resp: clear to auscultation bilaterally Back: symmetric, no curvature. ROM normal. No CVA tenderness. Cardio: regular rate and rhythm, S1, S2 normal, no murmur, click, rub or gallop GI: soft, non-tender; bowel sounds normal; no masses,  no organomegaly Extremities: extremities normal, atraumatic, no cyanosis or edema Neurologic: Alert and oriented X 3, normal strength and tone. Normal symmetric reflexes. Normal coordination and gait  ECOG PERFORMANCE STATUS: 1 - Symptomatic but completely ambulatory  Blood pressure 127/68, pulse 72, temperature 98.2 F (36.8 C), temperature source Oral, resp. rate 16, weight 148 lb (67.1 kg), SpO2 100 %.  LABORATORY DATA: Lab Results  Component Value Date   WBC 7.5 05/22/2022   HGB 10.3 (L) 05/22/2022   HCT 31.0 (L) 05/22/2022   MCV 93.1 05/22/2022   PLT 207 05/22/2022      Chemistry      Component Value Date/Time   NA 141 05/22/2022 0836   K 2.7 (LL) 05/22/2022 0836   CL 110 05/22/2022 0836   CO2 24 05/22/2022 0836   BUN 15 05/22/2022 0836   CREATININE 1.27 (H) 05/22/2022 0836      Component Value Date/Time   CALCIUM 8.8 (L) 05/22/2022 0836   ALKPHOS 53 05/22/2022 0836   AST 20 05/22/2022 0836   ALT  10 05/22/2022 0836   BILITOT 0.5 05/22/2022 0836       RADIOGRAPHIC STUDIES: CT Chest W Contrast  Result Date: 05/22/2022 CLINICAL DATA:  Metastatic non-small-cell lung cancer stage IV squamous cell carcinoma. Just finished chemotherapy. Diarrhea. EXAM: CT CHEST, ABDOMEN, AND PELVIS WITH CONTRAST TECHNIQUE: Multidetector CT imaging of the chest, abdomen and pelvis was performed following the standard protocol during bolus administration of intravenous contrast. RADIATION DOSE REDUCTION: This exam was performed according to the departmental dose-optimization program which includes automated exposure control, adjustment of the mA and/or kV according to  patient size and/or use of iterative reconstruction technique. CONTRAST:  170mL OMNIPAQUE IOHEXOL 300 MG/ML  SOLN COMPARISON:  CT 02/18/2022 and older FINDINGS: CT CHEST FINDINGS Cardiovascular: Right-sided chest port. The heart is nonenlarged. No pericardial effusion. Scattered atherosclerotic partially calcified plaque along the thoracic aorta. Slight ectasia of the ascending aorta stable with diameter of the ascending aorta at the level of right pulmonary artery measuring up to 4 cm in diameter. Mediastinum/Nodes: No specific abnormal lymph node enlargement present in the axillary region, hilum or mediastinum. Slight wall thickening along the course of the midthoracic esophagus, nonspecific. Mild mediastinal fat stranding. Lungs/Pleura: Centrilobular emphysematous changes seen in the right lung. No right-sided consolidation, pneumothorax or effusion. Calcified right lower lobe lung nodule on series 506, image 135 consistent with old granulomatous disease. The small nodule identified along the margin of the middle lobe which is triangular shaped is stable today with a diameter of 6 mm on series 506, image 117. Additional triangular nodular focus right upper lobe on image 77 measuring 5 mm. The 4 mm right lower lobe nodule today on series 506, image 74 stable on the prior. Recommend continued surveillance of this nodule. There is volume loss of the left lung with areas of distortion, bronchiectasis and opacity with pleural thickening. Most confluent opacity seen left upper lobe perihilar as well as superior segment of the left lower lobe. Some peripheral areas of the left lung base with some rounded atelectasis type appearance and some pleural thickening. Extent and distribution is similar to previous examination. No new mass lesion identified. Musculoskeletal: Scattered degenerative changes seen along the spine. CT ABDOMEN PELVIS FINDINGS Hepatobiliary: Mild fatty liver infiltration. No space-occupying liver  lesion. Gallbladder is contracted. Patent portal vein. Pancreas: Unremarkable. No pancreatic ductal dilatation or surrounding inflammatory changes. Spleen: Normal in size without focal abnormality. Adrenals/Urinary Tract: Stable slight thickening of the adrenal glands. Mild bilateral renal atrophy. There are punctate nonobstructing left-sided renal stone and a slightly larger 5 mm stone in the lower pole of the right. Tiny Bosniak 1 bilateral renal cysts are stable. No enhancing renal mass or collecting system dilatation. Ureters have normal course and caliber along their course to the bladder. Bladder is preserved as well. Stomach/Bowel: There is wall thickening along the rectosigmoid colon with stranding. Please correlate for colitis. There is some prominent enhancement as well of the rectum. Patient's history of diarrhea. More proximally the large bowel is nondilated. There are also some areas of mild wall thickening along the proximal transverse colon. Scattered colonic diverticula. No obstruction. Moderate debris in the stomach. Small bowel is nondilated. Vascular/Lymphatic: Diffuse vascular calcifications are identified. There is mild ectasia of the infrarenal abdominal aorta with diameter approaching up to 2.3 cm. Nonaneurysmal. Normal caliber IVC. No specific abnormal lymph node enlargement seen in the abdomen and pelvis. Reproductive: Enlarged prostate. Other: Anasarca.  No developing ascites. Musculoskeletal: Bridging osteophytes along the sacroiliac joints. Curvature and degenerative changes of  the lumbar spine. IMPRESSION: 1. Stable volume loss of the left lung with opacity, bronchiectasis with pleural thickening and scarring. Stable distortion. 2. Stable small pulmonary nodules in the right lung. Recommend continued surveillance. 3. Wall thickening along the rectosigmoid colon with stranding. Patient's history of diarrhea. There are also some areas of mild wall thickening along the proximal transverse  colon. Findings are suspicious for colitis, likely infectious or inflammatory in etiology. 4. Mild fatty liver infiltration. 5. Bilateral nonobstructing renal calculi. Aortic Atherosclerosis (ICD10-I70.0) and Emphysema (ICD10-J43.9). Electronically Signed   By: Jill Side M.D.   On: 05/22/2022 16:33   CT Abdomen Pelvis W Contrast  Result Date: 05/22/2022 CLINICAL DATA:  Metastatic non-small-cell lung cancer stage IV squamous cell carcinoma. Just finished chemotherapy. Diarrhea. EXAM: CT CHEST, ABDOMEN, AND PELVIS WITH CONTRAST TECHNIQUE: Multidetector CT imaging of the chest, abdomen and pelvis was performed following the standard protocol during bolus administration of intravenous contrast. RADIATION DOSE REDUCTION: This exam was performed according to the departmental dose-optimization program which includes automated exposure control, adjustment of the mA and/or kV according to patient size and/or use of iterative reconstruction technique. CONTRAST:  168mL OMNIPAQUE IOHEXOL 300 MG/ML  SOLN COMPARISON:  CT 02/18/2022 and older FINDINGS: CT CHEST FINDINGS Cardiovascular: Right-sided chest port. The heart is nonenlarged. No pericardial effusion. Scattered atherosclerotic partially calcified plaque along the thoracic aorta. Slight ectasia of the ascending aorta stable with diameter of the ascending aorta at the level of right pulmonary artery measuring up to 4 cm in diameter. Mediastinum/Nodes: No specific abnormal lymph node enlargement present in the axillary region, hilum or mediastinum. Slight wall thickening along the course of the midthoracic esophagus, nonspecific. Mild mediastinal fat stranding. Lungs/Pleura: Centrilobular emphysematous changes seen in the right lung. No right-sided consolidation, pneumothorax or effusion. Calcified right lower lobe lung nodule on series 506, image 135 consistent with old granulomatous disease. The small nodule identified along the margin of the middle lobe which is  triangular shaped is stable today with a diameter of 6 mm on series 506, image 117. Additional triangular nodular focus right upper lobe on image 77 measuring 5 mm. The 4 mm right lower lobe nodule today on series 506, image 74 stable on the prior. Recommend continued surveillance of this nodule. There is volume loss of the left lung with areas of distortion, bronchiectasis and opacity with pleural thickening. Most confluent opacity seen left upper lobe perihilar as well as superior segment of the left lower lobe. Some peripheral areas of the left lung base with some rounded atelectasis type appearance and some pleural thickening. Extent and distribution is similar to previous examination. No new mass lesion identified. Musculoskeletal: Scattered degenerative changes seen along the spine. CT ABDOMEN PELVIS FINDINGS Hepatobiliary: Mild fatty liver infiltration. No space-occupying liver lesion. Gallbladder is contracted. Patent portal vein. Pancreas: Unremarkable. No pancreatic ductal dilatation or surrounding inflammatory changes. Spleen: Normal in size without focal abnormality. Adrenals/Urinary Tract: Stable slight thickening of the adrenal glands. Mild bilateral renal atrophy. There are punctate nonobstructing left-sided renal stone and a slightly larger 5 mm stone in the lower pole of the right. Tiny Bosniak 1 bilateral renal cysts are stable. No enhancing renal mass or collecting system dilatation. Ureters have normal course and caliber along their course to the bladder. Bladder is preserved as well. Stomach/Bowel: There is wall thickening along the rectosigmoid colon with stranding. Please correlate for colitis. There is some prominent enhancement as well of the rectum. Patient's history of diarrhea. More proximally the large bowel is  nondilated. There are also some areas of mild wall thickening along the proximal transverse colon. Scattered colonic diverticula. No obstruction. Moderate debris in the stomach.  Small bowel is nondilated. Vascular/Lymphatic: Diffuse vascular calcifications are identified. There is mild ectasia of the infrarenal abdominal aorta with diameter approaching up to 2.3 cm. Nonaneurysmal. Normal caliber IVC. No specific abnormal lymph node enlargement seen in the abdomen and pelvis. Reproductive: Enlarged prostate. Other: Anasarca.  No developing ascites. Musculoskeletal: Bridging osteophytes along the sacroiliac joints. Curvature and degenerative changes of the lumbar spine. IMPRESSION: 1. Stable volume loss of the left lung with opacity, bronchiectasis with pleural thickening and scarring. Stable distortion. 2. Stable small pulmonary nodules in the right lung. Recommend continued surveillance. 3. Wall thickening along the rectosigmoid colon with stranding. Patient's history of diarrhea. There are also some areas of mild wall thickening along the proximal transverse colon. Findings are suspicious for colitis, likely infectious or inflammatory in etiology. 4. Mild fatty liver infiltration. 5. Bilateral nonobstructing renal calculi. Aortic Atherosclerosis (ICD10-I70.0) and Emphysema (ICD10-J43.9). Electronically Signed   By: Jill Side M.D.   On: 05/22/2022 16:33    ASSESSMENT AND PLAN: This is a very pleasant 77 years old white male with Stage IV (T3, N1, M1b) non-small cell lung cancer favoring squamous cell carcinoma presented with superior segment left lower lobe lung mass with invasion of the chest wall and destruction of the posterior left sixth and seventh ribs as well as left hilar adenopathy and suspicious pleural metastasis diagnosed in March 2023 He is currently undergoing palliative radiotherapy to the left lower lobe lung mass with chest wall invasion under the care of Dr. Sondra Come. He is also undergoing systemic chemotherapy with carboplatin for AUC of 5, paclitaxel 175 Mg/M2 and Libtayo (Cempilimab) 350 Mg IV every 3 weeks.  He started the first dose of his treatment on July 18, 2021.  Status post 4 cycles.   The patient started maintenance treatment with single agent Libtayo (Cempilimab) 350 Mg IV every 3 weeks on October 10, 2021 status post 3 cycles.  His treatment was discontinued secondary to immunotherapy mediated colitis and significant diarrhea. The patient is currently on observation and he is feeling fine with no concerning complaints except for the persistent diarrhea and he had a colonoscopy that showed finding consistent with immunotherapy mediated colitis. We started him again last week on high-dose prednisone to be tapered slowly over several weeks and he started feeling well already. He had repeat CT scan of the chest, abdomen and pelvis performed recently.  I personally and independently reviewed the scan and discussed the results with the patient and his wife. His scan showed no concerning findings for disease recurrence or metastasis but it showed the wall thickening along the rectosigmoid colon with stranding consistent with his history of the diarrhea. I recommended for the patient to continue on observation with repeat CT scan of the chest, abdomen and pelvis in 3 months. For the history of immunotherapy mediated colitis, he will continue with the slow taper of prednisone over the next several weeks. He was advised to call immediately if he has any other concerning symptoms in the interval. The patient voices understanding of current disease status and treatment options and is in agreement with the current care plan.  All questions were answered. The patient knows to call the clinic with any problems, questions or concerns. We can certainly see the patient much sooner if necessary.  The total time spent in the appointment was 30 minutes.  Disclaimer: This note was dictated with voice recognition software. Similar sounding words can inadvertently be transcribed and may not be corrected upon review.

## 2022-08-15 ENCOUNTER — Ambulatory Visit: Payer: Medicare Other

## 2022-08-15 DIAGNOSIS — N401 Enlarged prostate with lower urinary tract symptoms: Secondary | ICD-10-CM | POA: Diagnosis not present

## 2022-08-16 LAB — PSA: Prostate Specific Ag, Serum: 0.9 ng/mL (ref 0.0–4.0)

## 2022-08-21 ENCOUNTER — Other Ambulatory Visit: Payer: Self-pay

## 2022-08-21 ENCOUNTER — Other Ambulatory Visit: Payer: Medicare Other

## 2022-08-21 ENCOUNTER — Telehealth: Payer: Self-pay | Admitting: Medical Oncology

## 2022-08-21 ENCOUNTER — Inpatient Hospital Stay: Payer: Medicare Other | Attending: Internal Medicine

## 2022-08-21 DIAGNOSIS — C7951 Secondary malignant neoplasm of bone: Secondary | ICD-10-CM | POA: Diagnosis not present

## 2022-08-21 DIAGNOSIS — C3432 Malignant neoplasm of lower lobe, left bronchus or lung: Secondary | ICD-10-CM | POA: Diagnosis not present

## 2022-08-21 DIAGNOSIS — C3490 Malignant neoplasm of unspecified part of unspecified bronchus or lung: Secondary | ICD-10-CM

## 2022-08-21 LAB — CBC WITH DIFFERENTIAL (CANCER CENTER ONLY)
Abs Immature Granulocytes: 0.04 10*3/uL (ref 0.00–0.07)
Basophils Absolute: 0.1 10*3/uL (ref 0.0–0.1)
Basophils Relative: 1 %
Eosinophils Absolute: 0.1 10*3/uL (ref 0.0–0.5)
Eosinophils Relative: 2 %
HCT: 37.8 % — ABNORMAL LOW (ref 39.0–52.0)
Hemoglobin: 12.5 g/dL — ABNORMAL LOW (ref 13.0–17.0)
Immature Granulocytes: 1 %
Lymphocytes Relative: 11 %
Lymphs Abs: 0.9 10*3/uL (ref 0.7–4.0)
MCH: 30.7 pg (ref 26.0–34.0)
MCHC: 33.1 g/dL (ref 30.0–36.0)
MCV: 92.9 fL (ref 80.0–100.0)
Monocytes Absolute: 0.5 10*3/uL (ref 0.1–1.0)
Monocytes Relative: 6 %
Neutro Abs: 6.7 10*3/uL (ref 1.7–7.7)
Neutrophils Relative %: 79 %
Platelet Count: 186 10*3/uL (ref 150–400)
RBC: 4.07 MIL/uL — ABNORMAL LOW (ref 4.22–5.81)
RDW: 13.2 % (ref 11.5–15.5)
WBC Count: 8.3 10*3/uL (ref 4.0–10.5)
nRBC: 0 % (ref 0.0–0.2)

## 2022-08-21 LAB — CMP (CANCER CENTER ONLY)
ALT: 5 U/L (ref 0–44)
AST: 16 U/L (ref 15–41)
Albumin: 3.8 g/dL (ref 3.5–5.0)
Alkaline Phosphatase: 68 U/L (ref 38–126)
Anion gap: 7 (ref 5–15)
BUN: 11 mg/dL (ref 8–23)
CO2: 26 mmol/L (ref 22–32)
Calcium: 9.2 mg/dL (ref 8.9–10.3)
Chloride: 109 mmol/L (ref 98–111)
Creatinine: 1.17 mg/dL (ref 0.61–1.24)
GFR, Estimated: >60 mL/min (ref >60)
Glucose, Bld: 122 mg/dL — ABNORMAL HIGH (ref 70–99)
Potassium: 3 mmol/L — ABNORMAL LOW (ref 3.5–5.1)
Sodium: 142 mmol/L (ref 135–145)
Total Bilirubin: 0.4 mg/dL (ref 0.3–1.2)
Total Protein: 6.5 g/dL (ref 6.5–8.1)

## 2022-08-21 NOTE — Telephone Encounter (Signed)
Per Dr. Arbutus Ped , Pt notified  "....advise him to increase the potassium rich diet".

## 2022-08-22 ENCOUNTER — Ambulatory Visit (INDEPENDENT_AMBULATORY_CARE_PROVIDER_SITE_OTHER): Payer: Medicare Other | Admitting: Urology

## 2022-08-22 ENCOUNTER — Encounter: Payer: Self-pay | Admitting: Urology

## 2022-08-22 VITALS — BP 155/75 | HR 72

## 2022-08-22 DIAGNOSIS — R351 Nocturia: Secondary | ICD-10-CM | POA: Diagnosis not present

## 2022-08-22 DIAGNOSIS — N401 Enlarged prostate with lower urinary tract symptoms: Secondary | ICD-10-CM | POA: Diagnosis not present

## 2022-08-22 DIAGNOSIS — R310 Gross hematuria: Secondary | ICD-10-CM | POA: Diagnosis not present

## 2022-08-22 LAB — URINALYSIS, ROUTINE W REFLEX MICROSCOPIC
Bilirubin, UA: NEGATIVE
Glucose, UA: NEGATIVE
Ketones, UA: NEGATIVE
Leukocytes,UA: NEGATIVE
Nitrite, UA: NEGATIVE
Protein,UA: NEGATIVE
RBC, UA: NEGATIVE
Specific Gravity, UA: 1.01 (ref 1.005–1.030)
Urobilinogen, Ur: 0.2 mg/dL (ref 0.2–1.0)
pH, UA: 7 (ref 5.0–7.5)

## 2022-08-22 LAB — BLADDER SCAN AMB NON-IMAGING

## 2022-08-22 MED ORDER — FINASTERIDE 5 MG PO TABS: 5.0000 mg | ORAL_TABLET | Freq: Every day | ORAL | 3 refills | Status: DC

## 2022-08-22 NOTE — Progress Notes (Unsigned)
08/22/2022 8:42 AM   Edward Beck 1945-10-21 [id number]  Referring provider: [name], PA-C [address] [place],  [place] [address]  Followup BPh and nocturia   HPI: PSA 0.9. IPSS 21 QOL 4 on finasteride. Urine stream strong. Nocturia 2-4x. Urinary frequency every 2-3 hours.    PMH: Past Medical History:  Diagnosis Date   Condyloma acuminatum of scrotum    COVID 2021   December 2022 - mild   History of radiation therapy    Left Lung- 07/23/21-08/02/21- Dr. Antony Blackbird   Lung cancer [facility])    Pneumonia    Scrotal lesion     Surgical History: Past Surgical History:  Procedure Laterality Date   BRONCHIAL BIOPSY  07/02/2021   Procedure: BRONCHIAL BIOPSIES;  Surgeon: Josephine Igo, DO;  Location: [facility];  Service: Pulmonary;;   BRONCHIAL BRUSHINGS  07/02/2021   Procedure: BRONCHIAL BRUSHINGS;  Surgeon: Josephine Igo, DO;  [facility]: [facility];  Service: Pulmonary;;   BRONCHIAL NEEDLE ASPIRATION BIOPSY  07/02/2021   Procedure: BRONCHIAL NEEDLE ASPIRATION BIOPSIES;  Surgeon: Josephine Igo, DO;  Location: [facility];  Service: Pulmonary;;   HERNIA REPAIR  2009   left inguinal   IR IMAGING GUIDED PORT INSERTION  07/16/2021   SCROTAL EXPLORATION  11/21/2011   Procedure: SCROTUM EXPLORATION;  Surgeon: Lindaann Slough, MD;  Location: [facility];  Service: Urology;  Laterality: N/A;  EXCISION, BIOPSY SCROTAL CONDYLOMA    VIDEO BRONCHOSCOPY WITH ENDOBRONCHIAL ULTRASOUND Bilateral 07/02/2021   Procedure: VIDEO BRONCHOSCOPY WITH ENDOBRONCHIAL ULTRASOUND;  Surgeon: Josephine Igo, DO;  Location: [facility];  Service: Pulmonary;  Laterality: Bilateral;    Home Medications:  Allergies as of 08/22/2022       Reactions   Tamiflu [oseltamivir Phosphate] Rash        Medication List        Accurate as of Aug 22, 2022  8:42 AM. If you have any questions, ask your nurse or doctor.          Anoro Ellipta 62.5-25 MCG/ACT Aepb Generic drug:  umeclidinium-vilanterol Inhale 1 puff into the lungs daily.   B-12 1000 MCG Tabs Take 1 tablet by mouth daily.   diphenoxylate-atropine 2.5-0.025 MG tablet Commonly known as: LOMOTIL TAKE 1 TABLET BY MOUTH FOUR TIMES DAILY AS NEEDED FOR DIARRHEA OR LOOSE STOOLS   finasteride 5 MG tablet Commonly known as: PROSCAR Take 1 tablet (5 mg total) by mouth daily.   gabapentin 100 MG capsule Commonly known as: NEURONTIN Take 1 capsule (100 mg total) by mouth 3 (three) times daily. What changed:  when to take this additional instructions   lidocaine-prilocaine cream Commonly known as: EMLA Apply to the Port-A-Cath site 30 minutes before chemotherapy.   mirtazapine 15 MG tablet Commonly known as: Remeron Take 1 tablet (15 mg total) by mouth at bedtime.   omeprazole 20 MG capsule Commonly known as: PRILOSEC Take 1 capsule (20 mg total) by mouth daily.   potassium chloride SA 20 MEQ tablet Commonly known as: KLOR-CON M Take 1 tablet (20 mEq total) by mouth 2 (two) times daily.   predniSONE 10 MG tablet Commonly known as: DELTASONE 6 tablet p.o. daily for 7 days followed by 5 tablet p.o. daily for 7 days followed by 4 tablet p.o. daily for 7 days followed by 2 tablets p.o. daily for 7 days followed by 1 tablet p.o. daily for 7 days.   prochlorperazine 10 MG tablet Commonly known as: COMPAZINE Take 1 tablet (10 mg  total) by mouth every 6 (six) hours as needed for nausea or vomiting.   triamcinolone cream 0.1 % Commonly known as: KENALOG Apply 1 application  topically 2 (two) times daily as needed (dry skin).        Allergies:  Allergies  Allergen Reactions   Tamiflu [Oseltamivir Phosphate] Rash    Family History: No family history on file.  Social History:  reports that he has been smoking cigarettes. He has a 50.00 pack-year smoking history. He does not have any smokeless tobacco history on file. He reports current alcohol use of about 1.0 standard drink of alcohol per  week. He reports that he does not use drugs.  ROS: All other review of systems were reviewed and are negative except what is noted above in HPI  Physical Exam: BP (!) 155/75   Pulse 72   Constitutional:  Alert and oriented, No acute distress. HEENT: [place] AT, moist mucus membranes.  Trachea midline, no masses. Cardiovascular: No clubbing, cyanosis, or edema. Respiratory: Normal respiratory effort, no increased work of breathing. GI: Abdomen is soft, nontender, nondistended, no abdominal masses GU: No CVA tenderness.  Lymph: No cervical or inguinal lymphadenopathy. Skin: No rashes, bruises or suspicious lesions. Neurologic: Grossly intact, no focal deficits, moving all 4 extremities. Psychiatric: Normal mood and affect.  Laboratory Data: Lab Results  Component Value Date   WBC 8.3 08/21/2022   HGB 12.5 (L) 08/21/2022   HCT 37.8 (L) 08/21/2022   MCV 92.9 08/21/2022   PLT 186 08/21/2022    Lab Results  Component Value Date   CREATININE 1.17 08/21/2022    No results found for: "PSA"  No results found for: "TESTOSTERONE"  No results found for: "HGBA1C"  Urinalysis    Component Value Date/Time   COLORURINE YELLOW 11/29/2021 1735   APPEARANCEUR Clear 03/07/2022 1012   LABSPEC 1.010 11/29/2021 1735   PHURINE 7.0 11/29/2021 1735   GLUCOSEU Negative 03/07/2022 1012   HGBUR NEGATIVE 11/29/2021 1735   BILIRUBINUR Negative 03/07/2022 1012   KETONESUR NEGATIVE 11/29/2021 1735   PROTEINUR 1+ (A) 03/07/2022 1012   PROTEINUR 30 (A) 11/29/2021 1735   NITRITE Negative 03/07/2022 1012   NITRITE NEGATIVE 11/29/2021 1735   LEUKOCYTESUR Negative 03/07/2022 1012   LEUKOCYTESUR NEGATIVE 11/29/2021 1735    Lab Results  Component Value Date   LABMICR See below: 03/07/2022   WBCUA 0-5 03/07/2022   LABEPIT None seen 03/07/2022   MUCUS Present (A) 09/24/2021   BACTERIA None seen 03/07/2022    Pertinent Imaging: *** No results found for this or any previous visit.  No results  found for this or any previous visit.  No results found for this or any previous visit.  No results found for this or any previous visit.  No results found for this or any previous visit.  No valid procedures specified. No results found for this or any previous visit.  No results found for this or any previous visit.   Assessment & Plan:    1. Benign localized prostatic hyperplasia with lower urinary tract symptoms (LUTS) -continue finasteride - Urinalysis, Routine w reflex microscopic - BLADDER SCAN AMB NON-IMAGING  2. Nocturia -continue finasteride 5mg    3. Gross hematuria Continue finasteride 5mg  daily   No follow-ups on file.  Wilkie Aye, MD  [facility] [place]

## 2022-08-22 NOTE — Patient Instructions (Signed)

## 2022-08-23 ENCOUNTER — Ambulatory Visit (HOSPITAL_BASED_OUTPATIENT_CLINIC_OR_DEPARTMENT_OTHER)
Admission: RE | Admit: 2022-08-23 | Discharge: 2022-08-23 | Disposition: A | Discharge status: Home / Self Care | Payer: Medicare Other | Source: Ambulatory Visit | Attending: Internal Medicine | Admitting: Internal Medicine

## 2022-08-23 DIAGNOSIS — C3490 Malignant neoplasm of unspecified part of unspecified bronchus or lung: Secondary | ICD-10-CM | POA: Insufficient documentation

## 2022-08-23 MED ORDER — IOHEXOL 300 MG/ML  SOLN
100.0000 mL | Freq: Once | INTRAMUSCULAR | Status: AC | PRN
  Administered 2022-08-23: 80 mL via INTRAVENOUS

## 2022-08-26 ENCOUNTER — Inpatient Hospital Stay (HOSPITAL_BASED_OUTPATIENT_CLINIC_OR_DEPARTMENT_OTHER): Payer: Medicare Other | Admitting: Internal Medicine

## 2022-08-26 ENCOUNTER — Other Ambulatory Visit: Payer: Self-pay

## 2022-08-26 VITALS — BP 136/83 | HR 81 | Temp 97.7°F | Resp 16 | Wt 156.1 lb

## 2022-08-26 DIAGNOSIS — C7951 Secondary malignant neoplasm of bone: Secondary | ICD-10-CM | POA: Diagnosis not present

## 2022-08-26 DIAGNOSIS — C3432 Malignant neoplasm of lower lobe, left bronchus or lung: Secondary | ICD-10-CM | POA: Diagnosis not present

## 2022-08-26 DIAGNOSIS — C3490 Malignant neoplasm of unspecified part of unspecified bronchus or lung: Secondary | ICD-10-CM | POA: Diagnosis not present

## 2022-08-26 NOTE — Progress Notes (Signed)
[facility] [phone]   [phone]  OFFICE PROGRESS NOTE  [name], PA-C [address] [place] [address]  DIAGNOSIS: Stage IV (T3, N1, M1b) non-small cell lung cancer favoring squamous cell carcinoma presented with superior segment left lower lobe lung mass with invasion of the chest wall and destruction of the posterior left sixth and seventh ribs as well as left hilar adenopathy and suspicious pleural metastasis diagnosed in March 2023  PRIOR THERAPY:  1) Palliative radiotherapy to the left lower lobe lung mass with chest wall invasion under the care of Dr. Roselind Messier. 2) Palliative systemic chemotherapy with carboplatin for AUC of 5, paclitaxel 175 Mg/M2 and Libtayo (Cempilimab) 350 Mg IV every 3 weeks with Neulasta support.  First dose was given on July 18, 2021.  Status post 4 cycles. 3) Maintenance treatment with Libtayo (Cempilimab) 350 Mg IV every 3 weeks, first dose October 10, 2021 status post 3 cycles.  Discontinued secondary to intolerance with immunotherapy mediated colitis and persistent diarrhea.  CURRENT THERAPY: Observation.   INTERVAL HISTORY: Edward Beck 77 y.o. male returns to the clinic today for follow-up visit accompanied by his wife.  The patient is feeling fine today with no concerning complaints except for mild fatigue and occasional pain on the back.  He has no chest pain, shortness of breath, cough or hemoptysis.  He has no nausea, vomiting, diarrhea or constipation.  He has no headache or visual changes.  He has no recent weight loss or night sweats.  He is here today for evaluation with repeat CT scan of the chest for restaging of his disease. MEDICAL HISTORY: Past Medical History:  Diagnosis Date   Condyloma acuminatum of scrotum    COVID 2021   December 2022 - mild   History of radiation therapy    Left Lung- 07/23/21-08/02/21- Dr. Antony Blackbird   Lung cancer [facility])    Pneumonia    Scrotal lesion      ALLERGIES:  is allergic to tamiflu [oseltamivir phosphate].  MEDICATIONS:  Current Outpatient Medications  Medication Sig Dispense Refill   omeprazole (PRILOSEC) 20 MG capsule Take 1 capsule (20 mg total) by mouth daily. 30 capsule 2   Cyanocobalamin (B-12) 1000 MCG TABS Take 1 tablet by mouth daily.     diphenoxylate-atropine (LOMOTIL) 2.5-0.025 MG tablet TAKE 1 TABLET BY MOUTH FOUR TIMES DAILY AS NEEDED FOR DIARRHEA OR LOOSE STOOLS 30 tablet 1   finasteride (PROSCAR) 5 MG tablet Take 1 tablet (5 mg total) by mouth daily. 90 tablet 3   gabapentin (NEURONTIN) 100 MG capsule Take 1 capsule (100 mg total) by mouth 3 (three) times daily. (Patient taking differently: Take 100 mg by mouth daily. Once a day per patient) 90 capsule 2   lidocaine-prilocaine (EMLA) cream Apply to the Port-A-Cath site 30 minutes before chemotherapy. 30 g 0   mirtazapine (REMERON) 15 MG tablet Take 1 tablet (15 mg total) by mouth at bedtime. 30 tablet 2   potassium chloride SA (KLOR-CON M) 20 MEQ tablet Take 1 tablet (20 mEq total) by mouth 2 (two) times daily. 14 tablet 0   predniSONE (DELTASONE) 10 MG tablet 6 tablet p.o. daily for 7 days followed by 5 tablet p.o. daily for 7 days followed by 4 tablet p.o. daily for 7 days followed by 2 tablets p.o. daily for 7 days followed by 1 tablet p.o. daily for 7 days. 150 tablet 0   prochlorperazine (COMPAZINE) 10 MG tablet Take  1 tablet (10 mg total) by mouth every 6 (six) hours as needed for nausea or vomiting. 30 tablet 0   triamcinolone cream (KENALOG) 0.1 % Apply 1 application  topically 2 (two) times daily as needed (dry skin).     umeclidinium-vilanterol (ANORO ELLIPTA) 62.5-25 MCG/ACT AEPB Inhale 1 puff into the lungs daily. 7 each 0   No current facility-administered medications for this visit.    SURGICAL HISTORY:  Past Surgical History:  Procedure Laterality Date   BRONCHIAL BIOPSY  07/02/2021   Procedure: BRONCHIAL BIOPSIES;  Surgeon: Josephine Igo, DO;   [facility];  Service: Pulmonary;;   BRONCHIAL BRUSHINGS  07/02/2021   Procedure: BRONCHIAL BRUSHINGS;  Surgeon: Josephine Igo, DO;  [facility];  Service: Pulmonary;;   BRONCHIAL NEEDLE ASPIRATION BIOPSY  07/02/2021   Procedure: BRONCHIAL NEEDLE ASPIRATION BIOPSIES;  Surgeon: Josephine Igo, DO;  [facility]: [facility];  Service: Pulmonary;;   HERNIA REPAIR  2009   left inguinal   IR IMAGING GUIDED PORT INSERTION  07/16/2021   SCROTAL EXPLORATION  11/21/2011   Procedure: SCROTUM EXPLORATION;  Surgeon: Lindaann Slough, MD;  Location: [facility];  Service: Urology;  Laterality: N/A;  EXCISION, BIOPSY SCROTAL CONDYLOMA    VIDEO BRONCHOSCOPY WITH ENDOBRONCHIAL ULTRASOUND Bilateral 07/02/2021   Procedure: VIDEO BRONCHOSCOPY WITH ENDOBRONCHIAL ULTRASOUND;  Surgeon: Josephine Igo, DO;  Location: [facility];  Service: Pulmonary;  Laterality: Bilateral;    REVIEW OF SYSTEMS:  Constitutional: positive for fatigue Eyes: negative Ears, nose, mouth, throat, and face: negative Respiratory: negative Cardiovascular: negative Gastrointestinal: negative Genitourinary:negative Integument/breast: negative Hematologic/lymphatic: negative Musculoskeletal:positive for back pain Neurological: negative Behavioral/Psych: negative Endocrine: negative Allergic/Immunologic: negative   PHYSICAL EXAMINATION: General appearance: alert, cooperative, fatigued, and no distress Head: Normocephalic, without obvious abnormality, atraumatic Neck: no adenopathy, no JVD, supple, symmetrical, trachea midline, and thyroid not enlarged, symmetric, no tenderness/mass/nodules Lymph nodes: Cervical, supraclavicular, and axillary nodes normal. Resp: clear to auscultation bilaterally Back: symmetric, no curvature. ROM normal. No CVA tenderness. Cardio: regular rate and rhythm, S1, S2 normal, no murmur, click, rub or gallop GI: soft, non-tender; bowel sounds normal; no masses,  no  organomegaly Extremities: extremities normal, atraumatic, no cyanosis or edema Neurologic: Alert and oriented X 3, normal strength and tone. Normal symmetric reflexes. Normal coordination and gait  ECOG PERFORMANCE STATUS: 1 - Symptomatic but completely ambulatory  Blood pressure 136/83, pulse 81, temperature 97.7 F (36.5 C), temperature source Temporal, resp. rate 16, weight 156 lb 1.6 oz (70.8 kg), SpO2 97 %.  LABORATORY DATA: Lab Results  Component Value Date   WBC 8.3 08/21/2022   HGB 12.5 (L) 08/21/2022   HCT 37.8 (L) 08/21/2022   MCV 92.9 08/21/2022   PLT 186 08/21/2022      Chemistry      Component Value Date/Time   NA 142 08/21/2022 1042   K 3.0 (L) 08/21/2022 1042   CL 109 08/21/2022 1042   CO2 26 08/21/2022 1042   BUN 11 08/21/2022 1042   CREATININE 1.17 08/21/2022 1042      Component Value Date/Time   CALCIUM 9.2 08/21/2022 1042   ALKPHOS 68 08/21/2022 1042   AST 16 08/21/2022 1042   ALT 5 08/21/2022 1042   BILITOT 0.4 08/21/2022 1042       RADIOGRAPHIC STUDIES: CT CHEST ABDOMEN PELVIS W CONTRAST  Result Date: 08/24/2022 CLINICAL DATA:  Non-small cell lung cancer restaging, chemotherapy and radiation complete * Tracking Code: BO * EXAM: CT CHEST, ABDOMEN, AND PELVIS WITH CONTRAST TECHNIQUE: Multidetector  CT imaging of the chest, abdomen and pelvis was performed following the standard protocol during bolus administration of intravenous contrast. RADIATION DOSE REDUCTION: This exam was performed according to the departmental dose-optimization program which includes automated exposure control, adjustment of the mA and/or kV according to patient size and/or use of iterative reconstruction technique. CONTRAST:  80mL OMNIPAQUE IOHEXOL 300 MG/ML SOLN additional oral enteric contrast COMPARISON:  05/22/2022 FINDINGS: CT CHEST FINDINGS Cardiovascular: Right chest port catheter. Aortic atherosclerosis. Normal heart size. Scattered left coronary artery calcifications. No  pericardial effusion. Mediastinum/Nodes: No enlarged mediastinal, hilar, or axillary lymph nodes. Thyroid gland, trachea, and esophagus demonstrate no significant findings. Lungs/Pleura: Moderate centrilobular and paraseptal emphysema and diffuse bilateral bronchial wall thickening. Unchanged post treatment appearance of the left chest, with dense perihilar and suprahilar fibrosis and volume loss, as well as scarring or atelectasis of the dependent left lower lobe (series 4, image 53). Unchanged small pulmonary nodules of the right lung, largest in the anterior right middle lobe measuring 0.7 cm (series 4, image 113) Musculoskeletal: No chest wall abnormality. No acute osseous findings. CT ABDOMEN PELVIS FINDINGS Hepatobiliary: No solid liver abnormality is seen. No gallstones, gallbladder wall thickening, or biliary dilatation. Pancreas: Unremarkable. No pancreatic ductal dilatation or surrounding inflammatory changes. Spleen: Normal in size without significant abnormality. Adrenals/Urinary Tract: New right adrenal nodule measuring 1.8 x 1.6 cm, previously 0.3 x 0.8 cm (series 2, image 61). Small bilateral nonobstructive renal calculi. No ureteral calculi or hydronephrosis. Simple, benign renal cortical cysts, for which no further follow-up or characterization is required. Bladder is unremarkable. Stomach/Bowel: Stomach is within normal limits. Appendix appears normal. No evidence of bowel wall thickening, distention, or inflammatory changes. Descending and sigmoid diverticulosis. Vascular/Lymphatic: Aortic atherosclerosis. No enlarged abdominal or pelvic lymph nodes. Reproductive: Prostatomegaly. Other: No abdominal wall hernia or abnormality. No ascites. Musculoskeletal: No acute osseous findings. IMPRESSION: 1. New right adrenal nodule, highly suspicious for metastasis. Consider PET-CT for metabolic characterization. 2. Unchanged post treatment/post radiation appearance of the left chest, with dense perihilar  and suprahilar fibrosis and volume loss, as well as scarring or atelectasis of the dependent left lower lobe. 3. Unchanged small nonspecific pulmonary nodules of the right lung. Continued attention on follow-up. 4. Emphysema and diffuse bilateral bronchial wall thickening. 5. Coronary artery disease. 6. Nonobstructive bilateral nephrolithiasis. 7. Prostatomegaly. These results will be called to the ordering clinician or representative by the Radiologist Assistant, and communication documented in the PACS or [employer]. Aortic Atherosclerosis (ICD10-I70.0) and Emphysema (ICD10-J43.9). Electronically Signed   By: Jearld Lesch M.D.   On: 08/24/2022 08:05    ASSESSMENT AND PLAN: This is a very pleasant 77 years old white male with Stage IV (T3, N1, M1b) non-small cell lung cancer favoring squamous cell carcinoma presented with superior segment left lower lobe lung mass with invasion of the chest wall and destruction of the posterior left sixth and seventh ribs as well as left hilar adenopathy and suspicious pleural metastasis diagnosed in March 2023 He is currently undergoing palliative radiotherapy to the left lower lobe lung mass with chest wall invasion under the care of Dr. Roselind Messier. He is also undergoing systemic chemotherapy with carboplatin for AUC of 5, paclitaxel 175 Mg/M2 and Libtayo (Cempilimab) 350 Mg IV every 3 weeks.  He started the first dose of his treatment on July 18, 2021.  Status post 4 cycles.   The patient started maintenance treatment with single agent Libtayo (Cempilimab) 350 Mg IV every 3 weeks on October 10, 2021 status post 3  cycles.  His treatment was discontinued secondary to immunotherapy mediated colitis and significant diarrhea which was treated with a tapered dose of prednisone and improved. The patient is currently on observation and he is feeling fine with no concerning complaints. He had repeat CT scan of the chest, abdomen and pelvis performed recently.  I personally and  independently reviewed the scan and discussed results with the patient and his wife and showed them the images. His scan showed no concerning finding for disease progression in the chest but there was enlarging right adrenal gland lesion suspicious for metastatic disease. I recommended for the patient to have a PET scan for further evaluation of this lesion and to rule out any disease metastasis.  If the PET scan showed hypermetabolic activity in this lesion, I may consider referring the patient to radiation oncology for SBRT to the right adrenal gland lesion if no other evidence of metastatic disease. The patient and his wife are in agreement with the current plan. He was advised to call immediately if he has any other concerning symptoms in the interval. The patient voices understanding of current disease status and treatment options and is in agreement with the current care plan.  All questions were answered. The patient knows to call the clinic with any problems, questions or concerns. We can certainly see the patient much sooner if necessary.  The total time spent in the appointment was 30 minutes.  Disclaimer: This note was dictated with voice recognition software. Similar sounding words can inadvertently be transcribed and may not be corrected upon review.

## 2022-08-29 ENCOUNTER — Encounter: Payer: Self-pay | Admitting: Medical Oncology

## 2022-08-29 ENCOUNTER — Encounter (HOSPITAL_COMMUNITY)
Admission: RE | Admit: 2022-08-29 | Discharge: 2022-08-29 | Disposition: A | Discharge status: Home / Self Care | Payer: Medicare Other | Source: Ambulatory Visit | Attending: Internal Medicine | Admitting: Internal Medicine

## 2022-08-29 ENCOUNTER — Telehealth: Payer: Self-pay | Admitting: Medical Oncology

## 2022-08-29 DIAGNOSIS — C3490 Malignant neoplasm of unspecified part of unspecified bronchus or lung: Secondary | ICD-10-CM | POA: Diagnosis not present

## 2022-08-29 DIAGNOSIS — R918 Other nonspecific abnormal finding of lung field: Secondary | ICD-10-CM | POA: Diagnosis not present

## 2022-08-29 DIAGNOSIS — C3432 Malignant neoplasm of lower lobe, left bronchus or lung: Secondary | ICD-10-CM | POA: Diagnosis not present

## 2022-08-29 DIAGNOSIS — C7971 Secondary malignant neoplasm of right adrenal gland: Secondary | ICD-10-CM | POA: Diagnosis not present

## 2022-08-29 LAB — GLUCOSE, CAPILLARY: Glucose-Capillary: 97 mg/dL (ref 70–99)

## 2022-08-29 MED ORDER — FLUDEOXYGLUCOSE F - 18 (FDG) INJECTION
7.5000 | Freq: Once | INTRAVENOUS | Status: AC
  Administered 2022-08-29: 7.7 via INTRAVENOUS

## 2022-08-29 NOTE — Telephone Encounter (Signed)
I Lvm for pt and sent Mychart message that I cancelled his lab appt 05/28 and to call back to confirm.  Keep appt June 11 th unless otherwise notified to cancel or change it.

## 2022-09-02 ENCOUNTER — Inpatient Hospital Stay: Payer: Medicare Other

## 2022-09-13 NOTE — Progress Notes (Unsigned)
[facility] OFFICE PROGRESS NOTE  [name], PA-C [address] [place] [address]  DIAGNOSIS: Stage IV (T3, N1, M1b) non-small cell lung cancer favoring squamous cell carcinoma presented with superior segment left lower lobe lung mass with invasion of the chest wall and destruction of the posterior left sixth and seventh ribs as well as left hilar adenopathy and suspicious pleural metastasis diagnosed in March 2023. He was found to have an adrenal lesion in June 2024.   PRIOR THERAPY:  1) Palliative radiotherapy to the left lower lobe lung mass with chest wall invasion under the care of Dr. Roselind Messier. 2) Palliative systemic chemotherapy with carboplatin for AUC of 5, paclitaxel 175 Mg/M2 and Libtayo (Cempilimab) 350 Mg IV every 3 weeks with Neulasta support.  First dose was given on July 18, 2021.  Status post 4 cycles. 3) Maintenance treatment with Libtayo (Cempilimab) 350 Mg IV every 3 weeks, first dose October 10, 2021 status post 3 cycles. Discontinued secondary to intolerance with immunotherapy mediated colitis and persistent diarrhea.   CURRENT THERAPY: re-referral to radiation oncology   INTERVAL HISTORY: Edward Beck 77 y.o. male returns to the clinic today for a follow up visit accompanied by his wife.  In summary, the patient has a history of stage IV lung cancer.  He had poor tolerance to chemotherapy and immunotherapy in the past and he had immunotherapy mediated colitis and diarrhea.  Therefore, the patient is currently on observation.  The patient was last seen by Dr. Arbutus Ped on 5/21. The patient had a restaging CT scan at that time that showed a new adrenal lesion. Therefore, Dr. Arbutus Ped recommended a PET scan to restage his disease.   Since last being seen, he denies any major changes in his health. He denies fevers, chills, night sweats, or weight loss. Denies chest pain, shortness of breath, cough or hemoptysis. Denies nausea, vomiting, or constipation.   He reports he has a soft bowel movement once a day in the morning.  He sometimes has a left chest wall discomfort and abdominal discomfort that resolves with Tylenol.  Denies headaches or vision changes.  He has a Port-A-Cath which has not been flushed in 4 to 5 months per patient report.  He is here to review his scan and discuss his next steps.     MEDICAL HISTORY: Past Medical History:  Diagnosis Date   Condyloma acuminatum of scrotum    COVID 2021   December 2022 - mild   History of radiation therapy    Left Lung- 07/23/21-08/02/21- Dr. Antony Blackbird   Lung cancer [facility])    Pneumonia    Scrotal lesion     ALLERGIES:  is allergic to tamiflu [oseltamivir phosphate].  MEDICATIONS:  Current Outpatient Medications  Medication Sig Dispense Refill   omeprazole (PRILOSEC) 20 MG capsule Take 1 capsule (20 mg total) by mouth daily. 30 capsule 2   Cyanocobalamin (B-12) 1000 MCG TABS Take 1 tablet by mouth daily.     diphenoxylate-atropine (LOMOTIL) 2.5-0.025 MG tablet TAKE 1 TABLET BY MOUTH FOUR TIMES DAILY AS NEEDED FOR DIARRHEA OR LOOSE STOOLS 30 tablet 1   finasteride (PROSCAR) 5 MG tablet Take 1 tablet (5 mg total) by mouth daily. 90 tablet 3   gabapentin (NEURONTIN) 100 MG capsule Take 1 capsule (100 mg total) by mouth 3 (three) times daily. (Patient taking differently: Take 100 mg by mouth daily. Once a day per patient) 90 capsule 2   lidocaine-prilocaine (EMLA) cream Apply to the  Port-A-Cath site 30 minutes before chemotherapy. 30 g 0   mirtazapine (REMERON) 15 MG tablet Take 1 tablet (15 mg total) by mouth at bedtime. 30 tablet 2   potassium chloride SA (KLOR-CON M) 20 MEQ tablet Take 1 tablet (20 mEq total) by mouth 2 (two) times daily. 14 tablet 0   predniSONE (DELTASONE) 10 MG tablet 6 tablet p.o. daily for 7 days followed by 5 tablet p.o. daily for 7 days followed by 4 tablet p.o. daily for 7 days followed by 2 tablets p.o. daily for 7 days followed by 1 tablet p.o. daily for 7 days.  150 tablet 0   prochlorperazine (COMPAZINE) 10 MG tablet Take 1 tablet (10 mg total) by mouth every 6 (six) hours as needed for nausea or vomiting. 30 tablet 0   triamcinolone cream (KENALOG) 0.1 % Apply 1 application  topically 2 (two) times daily as needed (dry skin).     umeclidinium-vilanterol (ANORO ELLIPTA) 62.5-25 MCG/ACT AEPB Inhale 1 puff into the lungs daily. 7 each 0   No current facility-administered medications for this visit.    SURGICAL HISTORY:  Past Surgical History:  Procedure Laterality Date   BRONCHIAL BIOPSY  07/02/2021   Procedure: BRONCHIAL BIOPSIES;  Surgeon: Josephine Igo, DO;  Location: [facility];  Service: Pulmonary;;   BRONCHIAL BRUSHINGS  07/02/2021   Procedure: BRONCHIAL BRUSHINGS;  Surgeon: Josephine Igo, DO;  Location: [facility];  Service: Pulmonary;;   BRONCHIAL NEEDLE ASPIRATION BIOPSY  07/02/2021   Procedure: BRONCHIAL NEEDLE ASPIRATION BIOPSIES;  Surgeon: Josephine Igo, DO;  Location: [facility];  Service: Pulmonary;;   HERNIA REPAIR  2009   left inguinal   IR IMAGING GUIDED PORT INSERTION  07/16/2021   SCROTAL EXPLORATION  11/21/2011   Procedure: SCROTUM EXPLORATION;  Surgeon: Lindaann Slough, MD;  Location: [facility];  Service: Urology;  Laterality: N/A;  EXCISION, BIOPSY SCROTAL CONDYLOMA    VIDEO BRONCHOSCOPY WITH ENDOBRONCHIAL ULTRASOUND Bilateral 07/02/2021   Procedure: VIDEO BRONCHOSCOPY WITH ENDOBRONCHIAL ULTRASOUND;  Surgeon: Josephine Igo, DO;  Location: [facility];  Service: Pulmonary;  Laterality: Bilateral;    REVIEW OF SYSTEMS:   Review of Systems  Constitutional: Negative for appetite change, chills, fatigue, fever and unexpected weight change.  HENT: Negative for mouth sores, nosebleeds, sore throat and trouble swallowing.   Eyes: Negative for eye problems and icterus.  Respiratory: Negative for cough, hemoptysis, shortness of breath and wheezing.   Cardiovascular: Positive for occasional chest wall discomfort.   Negative for leg swelling.  Gastrointestinal: Stated for soft/loose bowel movement in the mornings. Negative for abdominal pain, constipation, nausea and vomiting.  Genitourinary: Negative for bladder incontinence, difficulty urinating, dysuria, frequency and hematuria.   Musculoskeletal: Negative for back pain, gait problem, neck pain and neck stiffness.  Skin: Negative for itching and rash.  Neurological: Negative for dizziness, extremity weakness, gait problem, headaches, light-headedness and seizures.  Hematological: Negative for adenopathy. Does not bruise/bleed easily.  Psychiatric/Behavioral: Negative for confusion, depression and sleep disturbance. The patient is not nervous/anxious.     PHYSICAL EXAMINATION:  Blood pressure (!) 143/79, pulse 67, temperature 97.7 F (36.5 C), weight 153 lb 3.2 oz (69.5 kg).  ECOG PERFORMANCE STATUS: 1  Physical Exam  Constitutional: Oriented to person, place, and time and thin appearing male, and in no distress. HENT:  Head: Normocephalic and atraumatic.  Mouth/Throat: Oropharynx is clear and moist. No oropharyngeal exudate.  Eyes: Conjunctivae are normal. Right eye exhibits no discharge. Left eye exhibits no discharge. No scleral icterus.  Neck: Normal range of motion. Neck supple.  Cardiovascular: Normal rate, regular rhythm, normal heart sounds and intact distal pulses.   Pulmonary/Chest: Effort normal and breath sounds normal. No respiratory distress. No wheezes. No rales.  Abdominal: Soft. Bowel sounds are normal. Exhibits no distension and no mass. There is no tenderness.  Musculoskeletal: Normal range of motion. Exhibits no edema.  Lymphadenopathy:    No cervical adenopathy.  Neurological: Alert and oriented to person, place, and time. Exhibits normal muscle tone. Gait normal. Coordination normal.  Skin: Skin is warm and dry. No rash noted. Not diaphoretic. No erythema. No pallor.  Psychiatric: Mood, memory and judgment normal.  Vitals  reviewed.  LABORATORY DATA: Lab Results  Component Value Date   WBC 8.3 08/21/2022   HGB 12.5 (L) 08/21/2022   HCT 37.8 (L) 08/21/2022   MCV 92.9 08/21/2022   PLT 186 08/21/2022      Chemistry      Component Value Date/Time   NA 142 08/21/2022 1042   K 3.0 (L) 08/21/2022 1042   CL 109 08/21/2022 1042   CO2 26 08/21/2022 1042   BUN 11 08/21/2022 1042   CREATININE 1.17 08/21/2022 1042      Component Value Date/Time   CALCIUM 9.2 08/21/2022 1042   ALKPHOS 68 08/21/2022 1042   AST 16 08/21/2022 1042   ALT 5 08/21/2022 1042   BILITOT 0.4 08/21/2022 1042       RADIOGRAPHIC STUDIES:  NM PET Image Restage (PS) Skull Base to Thigh (F-18 FDG)  Result Date: 09/05/2022 CLINICAL DATA:  Subsequent treatment strategy for non-small cell left lung cancer status post chemotherapy and radiation therapy, with new right adrenal nodule on restaging CT. EXAM: NUCLEAR MEDICINE PET SKULL BASE TO THIGH TECHNIQUE: 7.8 mCi F-18 FDG was injected intravenously. Full-ring PET imaging was performed from the skull base to thigh after the radiotracer. CT data was obtained and used for attenuation correction and anatomic localization. Fasting blood glucose: 97 mg/dl COMPARISON:  16/01/9603 CT chest, abdomen and pelvis. FINDINGS: Mediastinal blood pool activity: SUV max 1.9 Liver activity: SUV max NA NECK: No hypermetabolic lymph nodes in the neck. Incidental CT findings: Right internal jugular Port-A-Cath terminates in the lower third of the SVC. CHEST: Hypermetabolic indistinct 0.7 cm soft tissue lesion in the ventral left chest wall/pleural space with small erosion of the adjacent anterior left fourth rib with max SUV 14.1 (series 4/image 90), new from prior PET-CT. Low level non focal hypermetabolism associated with the extensive left parahilar radiation fibrosis compatible with inflammatory uptake. No additional hypermetabolic pulmonary findings. A few scattered solid right pulmonary nodules measuring up to 0.6  cm in the right middle lobe (series 7/image 50), below PET resolution, unchanged from recent CT, without significant FDG uptake. No enlarged or hypermetabolic axillary, mediastinal or hilar lymph nodes. Incidental CT findings: Moderate centrilobular emphysema. Coronary atherosclerosis. Atherosclerotic thoracic aorta with dilated 4.2 cm ascending thoracic aorta. ABDOMEN/PELVIS: Intensely hypermetabolic 2.1 cm right adrenal nodule with density 34 HU with max SUV 40.4 (series 4/image 124), new from 06/06/2021 PET-CT. No abnormal hypermetabolic activity within the liver, pancreas, left adrenal gland, or spleen. No hypermetabolic lymph nodes in the abdomen or pelvis. Incidental CT findings: Atherosclerotic abdominal aorta with 3.0 cm infrarenal abdominal aortic aneurysm. Nonobstructing bilateral renal stones, largest 5 mm in the lower right kidney. Small right renal cysts, largest 2.1 cm in the posterior upper right kidney, for which no follow-up imaging is recommended. Marked sigmoid diverticulosis. Mild prostatomegaly. SKELETON: No additional focal hypermetabolic activity  to suggest skeletal metastasis. Incidental CT findings: None. IMPRESSION: 1. Intensely hypermetabolic 2.1 cm right adrenal metastasis, new from 06/06/2021 PET-CT. 2. Hypermetabolic indistinct 0.7 cm soft tissue lesion in the ventral left chest wall/pleural space with small erosion of the adjacent anterior left fourth rib, new from prior PET-CT, compatible with small ventral left chest wall metastasis. 3. No additional sites of hypermetabolic metastatic disease. 4. A few scattered solid right pulmonary nodules measuring up to 0.6 cm in the right middle lobe, below PET resolution, unchanged from recent CT, without significant FDG uptake. Continued chest CT surveillance advised. 5. Infrarenal 3.0 cm abdominal aortic aneurysm. Recommend follow-up ultrasound every 3 years. 6. Aortic Atherosclerosis (ICD10-I70.0) and Emphysema (ICD10-J43.9). Electronically  Signed   By: Delbert Phenix M.D.   On: 09/05/2022 14:32   CT CHEST ABDOMEN PELVIS W CONTRAST  Result Date: 08/24/2022 CLINICAL DATA:  Non-small cell lung cancer restaging, chemotherapy and radiation complete * Tracking Code: BO * EXAM: CT CHEST, ABDOMEN, AND PELVIS WITH CONTRAST TECHNIQUE: Multidetector CT imaging of the chest, abdomen and pelvis was performed following the standard protocol during bolus administration of intravenous contrast. RADIATION DOSE REDUCTION: This exam was performed according to the departmental dose-optimization program which includes automated exposure control, adjustment of the mA and/or kV according to patient size and/or use of iterative reconstruction technique. CONTRAST:  80mL OMNIPAQUE IOHEXOL 300 MG/ML SOLN additional oral enteric contrast COMPARISON:  05/22/2022 FINDINGS: CT CHEST FINDINGS Cardiovascular: Right chest port catheter. Aortic atherosclerosis. Normal heart size. Scattered left coronary artery calcifications. No pericardial effusion. Mediastinum/Nodes: No enlarged mediastinal, hilar, or axillary lymph nodes. Thyroid gland, trachea, and esophagus demonstrate no significant findings. Lungs/Pleura: Moderate centrilobular and paraseptal emphysema and diffuse bilateral bronchial wall thickening. Unchanged post treatment appearance of the left chest, with dense perihilar and suprahilar fibrosis and volume loss, as well as scarring or atelectasis of the dependent left lower lobe (series 4, image 53). Unchanged small pulmonary nodules of the right lung, largest in the anterior right middle lobe measuring 0.7 cm (series 4, image 113) Musculoskeletal: No chest wall abnormality. No acute osseous findings. CT ABDOMEN PELVIS FINDINGS Hepatobiliary: No solid liver abnormality is seen. No gallstones, gallbladder wall thickening, or biliary dilatation. Pancreas: Unremarkable. No pancreatic ductal dilatation or surrounding inflammatory changes. Spleen: Normal in size without  significant abnormality. Adrenals/Urinary Tract: New right adrenal nodule measuring 1.8 x 1.6 cm, previously 0.3 x 0.8 cm (series 2, image 61). Small bilateral nonobstructive renal calculi. No ureteral calculi or hydronephrosis. Simple, benign renal cortical cysts, for which no further follow-up or characterization is required. Bladder is unremarkable. Stomach/Bowel: Stomach is within normal limits. Appendix appears normal. No evidence of bowel wall thickening, distention, or inflammatory changes. Descending and sigmoid diverticulosis. Vascular/Lymphatic: Aortic atherosclerosis. No enlarged abdominal or pelvic lymph nodes. Reproductive: Prostatomegaly. Other: No abdominal wall hernia or abnormality. No ascites. Musculoskeletal: No acute osseous findings. IMPRESSION: 1. New right adrenal nodule, highly suspicious for metastasis. Consider PET-CT for metabolic characterization. 2. Unchanged post treatment/post radiation appearance of the left chest, with dense perihilar and suprahilar fibrosis and volume loss, as well as scarring or atelectasis of the dependent left lower lobe. 3. Unchanged small nonspecific pulmonary nodules of the right lung. Continued attention on follow-up. 4. Emphysema and diffuse bilateral bronchial wall thickening. 5. Coronary artery disease. 6. Nonobstructive bilateral nephrolithiasis. 7. Prostatomegaly. These results will be called to the ordering clinician or representative by the Radiologist Assistant, and communication documented in the PACS or [employer]. Aortic Atherosclerosis (ICD10-I70.0) and Emphysema (ICD10-J43.9).  Electronically Signed   By: Jearld Lesch M.D.   On: 08/24/2022 08:05     ASSESSMENT/PLAN:  This is a very pleasant 77 year old Caucasian male with stage IV (T3, N1, M1 B) non-small cell lung cancer, favoring squamous cell carcinoma.  The patient presented with a superior segment left upper lobe lung mass with invasion of the chest and destruction of the posterior  left sixth and seventh ribs as well as left hilar adenopathy.  The patient has suspicious pleural metastases.  The patient was diagnosed in March 2023.   The patient underwent palliative radiation to left lower lobe lung mass and chest wall lesion under the care of Dr. Roselind Messier.   He then underwent systemic chemotherapy with carboplatin for an AUC 5, paclitaxel 175 mg per metered squared, Libtayo 350 mg IV every 3 weeks.  He completed 4 cycles this was started on 07/18/2021.  He then started maintenance treatment with single agent Libtayo 350 mg IV every 3 weeks starting 10/10/2021.  Cycle 3 was delayed due to significant fatigue and weakness. This was discontinued due to suspicious colitis and diarrhea.   He has been on observation.   His restaging CT scan from May 2024 showed enlarging right adrenal gland lesion suspicious for metastatic disease.   Dr. Arbutus Ped recommended a PET scan.   The patient was seen with Dr. Arbutus Ped. Dr. Arbutus Ped personally and independently reviewed the CT scan and discussed the results with the patient. The scan showed hypermetabolic lesion in the right adrenal gland which is new.  There is also hypermetabolism in the indistinct 0.7 cm soft tissue lesion in the left chest wall/pleural space.   Dr. Arbutus Ped recommends referral to Dr. Roselind Messier for SBRT to the adrenal lesion and see if he is a candidate for additional radiation to the chest wall hypermetabolic lesion.  I have placed the referral.  The patient has poor tolerance to chemotherapy and immunotherapy Dr. Arbutus Ped would like to delay starting systemic treatment as long as possible.  Dr. Arbutus Ped did let the patient know that at some point in time, he does expect that the patient will need to restart systemic treatment if he has additional sites of disease progression in the future.  We will see him back for a follow up visit in 4 months for a restaging CT scan of the CAP.   The patient has a Port-A-Cath which has not been  flushed in several months.  We will arrange for him to have this flushed today and arrange for a another flush appointment in 6 to 8 weeks.  The patient was advised to call immediately if she has any concerning symptoms in the interval. The patient voices understanding of current disease status and treatment options and is in agreement with the current care plan. All questions were answered. The patient knows to call the clinic with any problems, questions or concerns. We can certainly see the patient much sooner if necessary    Orders Placed This Encounter  Procedures   CT CHEST ABDOMEN PELVIS W CONTRAST    Standing Status:   Future    Standing Expiration Date:   09/16/2023    Order Specific Question:   If indicated for the ordered procedure, I authorize the administration of contrast media per Radiology protocol    Answer:   Yes    Order Specific Question:   Does the patient have a contrast media/X-ray dye allergy?    Answer:   No    Order Specific Question:  Preferred imaging location?    Answer:   [facility]    Order Specific Question:   If indicated for the ordered procedure, I authorize the administration of oral contrast media per Radiology protocol    Answer:   Yes   Ambulatory referral to Radiation Oncology    Referral Priority:   Routine    Referral Type:   Consultation    Referral Reason:   Specialty Services Required    Requested Specialty:   Radiation Oncology    Number of Visits Requested:   1     Trinidee Schrag L Dicy Smigel, PA-C 09/16/22  ADDENDUM: Hematology/Oncology Attending: I had a face-to-face encounter with the patient today.  I reviewed his record, lab, scan and recommended his care plan.  This is a very pleasant 77 years old white male with a stage IV non-small cell lung cancer diagnosed in March 2023 and was found to have right adrenal gland metastasis in June 2024.  The patient is status post palliative radiotherapy to left lower lobe lung mass  with chest wall invasion followed by systemic chemotherapy with carboplatin, paclitaxel and Libtayo (Cempilimab) status post 4 cycles followed by maintenance Libtayo (Cempilimab) for 3 more cycles before it was discontinued secondary to immunotherapy mediated colitis and persistent diarrhea.  The patient has been in observation since that time. He was found on recent imaging studies to have concerning metastatic lesion to the right adrenal gland. I ordered a PET scan which was performed recently and that showed intensely hypermetabolic 2.1 cm right adrenal metastasis that is new from the previous PET scan in March 2023.  There was also hypermetabolic and distinct 0.7 cm soft tissue lesion in the ventral chest wall/pleura adjacent to the anterior left fourth rib that is also new from the previous PET scan and suspicious for small ventral left chest wall metastasis.  There was no other areas of metastatic disease. I had a lengthy discussion with the patient and his wife today about his current condition and treatment options. With the oligometastatic disease, I recommended for the patient to see Dr. Roselind Messier for consideration of palliative radiotherapy to the 2 active site of disease and then will continue to monitor him closely. If he develop any further disease progression, we may consider him for systemic chemotherapy or discussion of palliative care. The patient and his wife are in agreement with the current plan. We will see him back for follow-up visit in 4 months for evaluation with repeat CT scan of the chest, abdomen and pelvis for restaging of his disease. The patient was advised to call immediately if he has any other concerning symptoms in the interval. The total time spent in the appointment was 30 minutes. Disclaimer: This note was dictated with voice recognition software. Similar sounding words can inadvertently be transcribed and may be missed upon review. Lajuana Matte, MD

## 2022-09-16 ENCOUNTER — Inpatient Hospital Stay: Payer: Medicare Other

## 2022-09-16 ENCOUNTER — Other Ambulatory Visit: Payer: Self-pay

## 2022-09-16 ENCOUNTER — Inpatient Hospital Stay: Payer: Medicare Other | Attending: Internal Medicine | Admitting: Physician Assistant

## 2022-09-16 VITALS — BP 143/79 | HR 67 | Temp 97.7°F | Wt 153.2 lb

## 2022-09-16 DIAGNOSIS — C7989 Secondary malignant neoplasm of other specified sites: Secondary | ICD-10-CM | POA: Diagnosis not present

## 2022-09-16 DIAGNOSIS — C7951 Secondary malignant neoplasm of bone: Secondary | ICD-10-CM | POA: Insufficient documentation

## 2022-09-16 DIAGNOSIS — C3492 Malignant neoplasm of unspecified part of left bronchus or lung: Secondary | ICD-10-CM | POA: Diagnosis not present

## 2022-09-16 DIAGNOSIS — C3490 Malignant neoplasm of unspecified part of unspecified bronchus or lung: Secondary | ICD-10-CM | POA: Diagnosis not present

## 2022-09-16 DIAGNOSIS — C7971 Secondary malignant neoplasm of right adrenal gland: Secondary | ICD-10-CM | POA: Insufficient documentation

## 2022-09-16 DIAGNOSIS — Z95828 Presence of other vascular implants and grafts: Secondary | ICD-10-CM

## 2022-09-16 DIAGNOSIS — C7970 Secondary malignant neoplasm of unspecified adrenal gland: Secondary | ICD-10-CM | POA: Diagnosis not present

## 2022-09-16 DIAGNOSIS — C3432 Malignant neoplasm of lower lobe, left bronchus or lung: Secondary | ICD-10-CM | POA: Diagnosis not present

## 2022-09-16 MED ORDER — SODIUM CHLORIDE 0.9% FLUSH: 10.0000 mL | INTRAVENOUS | Status: AC | PRN

## 2022-09-16 MED ORDER — HEPARIN SOD (PORK) LOCK FLUSH 100 UNIT/ML IV SOLN: 500.0000 [IU] | Freq: Once | INTRAVENOUS | Status: AC

## 2022-09-16 NOTE — Patient Instructions (Signed)

## 2022-09-16 NOTE — Progress Notes (Signed)
Histology and Location of Primary Cancer: Non-small cell lung cancer metastatic to adrenal gland   07-02-21 FINAL MICROSCOPIC DIAGNOSIS: A. LUNG, LLL, NEEDLE ASP  BX FORCEPS: - Malignant cells present - Non-small cell carcinoma, favor squamous carcinoma (see comment) B. LUNG, LLL, BRUSHING: - Malignant cells present - Non-small cell carcinoma, favor squamous carcinoma (see comment)  COMMENT:  Both the needle aspirate and brushing are sparsely cellular and show loosely cohesive groups of large, atypical cells with variably enlarged round to oval nuclei with coarse chromatin clumping and inconspicuous nucleoli.  The cellblock of the needle aspirate and biopsy show alveolar laded lung with desmoplastic and fibrotic stroma very focally infiltrated by loosely cohesive sheets and nests of atypical cells with variably large round to oval nuclei with inconspicuous nucleoli. Two immunohistochemical stains are attempted with adequate control on the limited tumor present to elucidate the history genesis of these neoplastic cells.  The cells are focally and weakly positive for squamous marker p40.  They are negative for the pulmonary adeno marker TTF-1.  As there is very limited tumor, additional confirmatory stains are not performed.   Location(s) of Symptomatic tumor(s): 08-29-22 IMPRESSION: 1. Intensely hypermetabolic 2.1 cm right adrenal metastasis, new from 06/06/2021 PET-CT. 2. Hypermetabolic indistinct 0.7 cm soft tissue lesion in the ventral left chest wall/pleural space with small erosion of the adjacent anterior left fourth rib, new from prior PET-CT, compatible with small ventral left chest wall metastasis. 3. No additional sites of hypermetabolic metastatic disease. 4. A few scattered solid right pulmonary nodules measuring up to 0.6 cm in the right middle lobe, below PET resolution, unchanged from recent CT, without significant FDG uptake. Continued chest CT surveillance  advised. 5. Infrarenal 3.0 cm abdominal aortic aneurysm. Recommend follow-up ultrasound every 3 years. 6. Aortic Atherosclerosis (ICD10-I70.0) and Emphysema (ICD10-J43.9).   Past/Anticipated chemotherapy by medical oncology, if any:  Edward Beck, Edward L, PA-C  09-16-22 DIAGNOSIS: Stage IV (T3, N1, M1b) non-small cell lung cancer favoring squamous cell carcinoma presented with superior segment left lower lobe lung mass with invasion of the chest wall and destruction of the posterior left sixth and seventh ribs as well as left hilar adenopathy and suspicious pleural metastasis diagnosed in March 2023. He was found to have an adrenal lesion in June 2024.   ASSESSMENT/PLAN:  This is a very pleasant 77 year old Caucasian male with stage IV (T3, N1, M1 B) non-small cell lung cancer, favoring squamous cell carcinoma.  The patient presented with a superior segment left upper lobe lung mass with invasion of the chest and destruction of the posterior left sixth and seventh ribs as well as left hilar adenopathy.  The patient has suspicious pleural metastases.  The patient was diagnosed in March 2023.    The patient underwent palliative radiation to left lower lobe lung mass and chest wall lesion under the care of Dr. Roselind Messier.   He then underwent systemic chemotherapy with carboplatin for an AUC 5, paclitaxel 175 mg per metered squared, Libtayo 350 mg IV every 3 weeks.  He completed 4 cycles this was started on 07/18/2021.   He then started maintenance treatment with single agent Libtayo 350 mg IV every 3 weeks starting 10/10/2021.  Cycle 3 was delayed due to significant fatigue and weakness. This was discontinued due to suspicious colitis and diarrhea.    He has been on observation.    His restaging CT scan from May 2024 showed enlarging right adrenal gland lesion suspicious for metastatic disease.    Dr. Arbutus Ped recommended a  PET scan.    The patient was seen with Dr. Arbutus Ped. Dr. Arbutus Ped personally  and independently reviewed the CT scan and discussed the results with the patient. The scan showed hypermetabolic lesion in the right adrenal gland which is new.  There is also hypermetabolism in the indistinct 0.7 cm soft tissue lesion in the left chest wall/pleural space.    Dr. Arbutus Ped recommends referral to Dr. Roselind Messier for SBRT to the adrenal lesion and see if he is a candidate for additional radiation to the chest wall hypermetabolic lesion.  I have placed the referral.  The patient has poor tolerance to chemotherapy and immunotherapy Dr. Arbutus Ped would like to delay starting systemic treatment as long as possible.  Dr. Arbutus Ped did let the patient know that at some point in time, he does expect that the patient will need to restart systemic treatment if he has additional sites of disease progression in the future.   We will see him back for a follow up visit in 4 months for a restaging CT scan of the CAP.   Patient's main complaints related to symptomatic tumor(s) are: He sometimes has a left chest wall discomfort and abdominal discomfort that resolves with Tylenol.   Pain on a scale of 0-10 is: 3 dull achy pain right abdomen.    Bowel- No Bladder- Urinary frequency and urgency. Patient currently taking Proscar daily.   Ambulatory status? Walker? Wheelchair?: Ambulatory   SAFETY ISSUES: Prior radiation? Palliative radiotherapy to the left lower lobe lung mass with chest wall invasion under the care of Dr. Roselind Messier.  Pacemaker/ICD? No  Possible current pregnancy? Na, pt male Is the patient on methotrexate? no  Additional Complaints / other details:    BP 129/81 (BP Location: Right Arm, Patient Position: Sitting, Cuff Size: Normal)   Pulse 72   Temp 97.8 F (36.6 C)   Resp 20   Ht 6' 1.5" (1.867 m)   Wt 153 lb 12.8 oz (69.8 kg)   SpO2 99%   BMI 20.02 kg/m

## 2022-09-23 NOTE — Progress Notes (Signed)
Radiation Oncology         ([phone]) [phone] ________________________________  Outpatient Re-Consultation  Name: Edward Beck MRN: [id number]  Date: 09/24/2022  DOB: 1946-03-07  Beck:[id number], Edward Rima, PA-C  Si Gaul, MD   REFERRING PHYSICIAN: Si Gaul, MD  DIAGNOSIS: The primary encounter diagnosis was Malignant neoplasm metastatic to adrenal gland, unspecified laterality (HCC). 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  Evidence of a new right adrenal gland metastasis - May 2024, along with a new hypermetabolic left chest wall/pleural space lesion   Stage IV (T3, N1, M1b) non-small cell lung cancer favoring squamous cell carcinoma presented with superior segment left lower lobe lung mass with invasion of the chest wall and destruction of the posterior left sixth and seventh ribs as well as left hilar adenopathy and suspicious pleural metastasis diagnosed in March 2023   HISTORY OF PRESENT ILLNESS::Edward Beck is a 77 y.o. male who is accompanied by his wife. he is seen as a courtesy of Dr Arbutus Ped for an opinion concerning radiation therapy as part of management for his recently diagnosed adrenal metastasis from a lung cancer primary.   The patient was last seen here on 09/12/2021 for 1 month follow up of radiation to the left chest/lung. To review, the patient completed palliative chemotherapy under Dr. Arbutus Ped in April of 2023. He went on to receive maintenance treatment beginning in July with Libtayo which was discontinued s/p cycle 3 secondary to intolerance with immunotherapy mediated colitis and persistent diarrhea. He has subsequently been under observation with Dr. Arbutus Ped since that time and has remained without evidence disease recurrence until his recently.   Last month, the patient presented for a restaging CT CAP with contrast on 08/23/22 which revealed a new right adrenal nodule suspicious for  metastatic disease. CT otherwise showed stable post radiation changes in the left chest with dense perihilar and suprahilar fibrosis and volume loss, as well as scarring or atelectasis of the dependent left lower lobe. Unchanged small nonspecific pulmonary nodules in the right lung were also appreciated.   Dr. Arbutus Ped accordingly recommended a PET scan to better evaluate to the right adrenal nodule. Subsequent PET scan on 08/29/22 demonstrated the new right adrenal nodule as intensely hypermetabolic, measuring 2.1 cm. A new hypermetabolic indistinct 0.7 cm soft tissue lesion in the ventral left chest wall/pleural space was also appreciated. Mild erosion of the adjacent anterior left fourth rib by the chest wall lesion was also noted. PET otherwise showed no additional sites of hypermetabolic metastatic disease. Other findings of potential clinical significance included a few unchanged scattered solid pulmonary nodules in the right middle lobe measuring up to 6 mm (these are without significant FDG uptake), and a infrarenal 3.0 cm abdominal aortic aneurysm.   Accordingly, the patient followed up with Dr. Arbutus Ped on 09/16/22 to discuss PET/CT findings. During which time, the patient denied any concerns other than occasional left chest wall and abdominal discomfort which typically resolves with tylenol. In terms of treatment, Dr. Arbutus Ped recommends SBRT to the adrenal lesion as well as possible radiation to the hypermetabolic chest wall lesion. Given his poor tolerance to both chemotherapy and immunotherapy in the past, Dr. Arbutus Ped would like to hold off on systemic treatment for as long as possible.    PREVIOUS RADIATION THERAPY: Yes   Intent: Palliative  Radiation Treatment Dates: 07/22/2021 through 08/02/2021 Site Technique Total Dose (Gy) Dose per Fx (Gy) Completed Fx Beam Energies  Lung,  Left: Lung_L 3D 30/30 3 10/10 6X    PAST MEDICAL HISTORY:  Past Medical History:  Diagnosis Date   Condyloma  acuminatum of scrotum    COVID 2021   December 2022 - mild   History of radiation therapy    Left Lung- 07/23/21-08/02/21- Dr. Antony Blackbird   Lung cancer [facility])    Pneumonia    Scrotal lesion     PAST SURGICAL HISTORY: Past Surgical History:  Procedure Laterality Date   BRONCHIAL BIOPSY  07/02/2021   Procedure: BRONCHIAL BIOPSIES;  Surgeon: Josephine Igo, DO;  Location: [facility];  Service: Pulmonary;;   BRONCHIAL BRUSHINGS  07/02/2021   Procedure: BRONCHIAL BRUSHINGS;  Surgeon: Josephine Igo, DO;  Location: [facility];  Service: Pulmonary;;   BRONCHIAL NEEDLE ASPIRATION BIOPSY  07/02/2021   Procedure: BRONCHIAL NEEDLE ASPIRATION BIOPSIES;  Surgeon: Josephine Igo, DO;  Location: [facility];  Service: Pulmonary;;   HERNIA REPAIR  2009   left inguinal   IR IMAGING GUIDED PORT INSERTION  07/16/2021   SCROTAL EXPLORATION  11/21/2011   Procedure: SCROTUM EXPLORATION;  Surgeon: Lindaann Slough, MD;  Location: [facility];  Service: Urology;  Laterality: N/A;  EXCISION, BIOPSY SCROTAL CONDYLOMA    VIDEO BRONCHOSCOPY WITH ENDOBRONCHIAL ULTRASOUND Bilateral 07/02/2021   Procedure: VIDEO BRONCHOSCOPY WITH ENDOBRONCHIAL ULTRASOUND;  Surgeon: Josephine Igo, DO;  Location: [facility];  Service: Pulmonary;  Laterality: Bilateral;    FAMILY HISTORY: No family history on file.  SOCIAL HISTORY:  Social History   Tobacco Use   Smoking status: Every Day    Packs/day: 1.00    Years: 50.00    Additional pack years: 0.00    Total pack years: 50.00    Types: Cigarettes  Vaping Use   Vaping Use: Former  Substance Use Topics   Alcohol use: Yes    Alcohol/week: 1.0 standard drink of alcohol    Types: 1 Cans of beer per week    Comment: occasionally   Drug use: Never    ALLERGIES:  Allergies  Allergen Reactions   Tamiflu [Oseltamivir Phosphate] Rash    MEDICATIONS:  Current Outpatient Medications  Medication Sig Dispense Refill   Cyanocobalamin (B-12) 1000 MCG TABS Take 1  tablet by mouth daily.     diphenoxylate-atropine (LOMOTIL) 2.5-0.025 MG tablet TAKE 1 TABLET BY MOUTH FOUR TIMES DAILY AS NEEDED FOR DIARRHEA OR LOOSE STOOLS 30 tablet 1   finasteride (PROSCAR) 5 MG tablet Take 1 tablet (5 mg total) by mouth daily. 90 tablet 3   lidocaine-prilocaine (EMLA) cream Apply to the Port-A-Cath site 30 minutes before chemotherapy. 30 g 0   omeprazole (PRILOSEC) 20 MG capsule Take 1 capsule (20 mg total) by mouth daily. (Patient not taking: Reported on 09/24/2022) 30 capsule 2   predniSONE (DELTASONE) 10 MG tablet 6 tablet p.o. daily for 7 days followed by 5 tablet p.o. daily for 7 days followed by 4 tablet p.o. daily for 7 days followed by 2 tablets p.o. daily for 7 days followed by 1 tablet p.o. daily for 7 days. 150 tablet 0   gabapentin (NEURONTIN) 100 MG capsule Take 1 capsule (100 mg total) by mouth 3 (three) times daily. (Patient not taking: Reported on 09/24/2022) 90 capsule 2   mirtazapine (REMERON) 15 MG tablet Take 1 tablet (15 mg total) by mouth at bedtime. (Patient not taking: Reported on 09/24/2022) 30 tablet 2   potassium chloride SA (KLOR-CON M) 20 MEQ tablet Take 1 tablet (20 mEq total) by mouth 2 (two) times  daily. (Patient not taking: Reported on 09/24/2022) 14 tablet 0   prochlorperazine (COMPAZINE) 10 MG tablet Take 1 tablet (10 mg total) by mouth every 6 (six) hours as needed for nausea or vomiting. (Patient not taking: Reported on 09/24/2022) 30 tablet 0   triamcinolone cream (KENALOG) 0.1 % Apply 1 application  topically 2 (two) times daily as needed (dry skin). (Patient not taking: Reported on 09/24/2022)     umeclidinium-vilanterol (ANORO ELLIPTA) 62.5-25 MCG/ACT AEPB Inhale 1 puff into the lungs daily. (Patient not taking: Reported on 09/24/2022) 7 each 0   No current facility-administered medications for this encounter.   Facility-Administered Medications Ordered in Other Encounters  Medication Dose Route Frequency Provider Last Rate Last Admin    heparin lock flush 100 unit/mL  500 Units Intravenous Once Heilingoetter, Cassandra L, PA-C       sodium chloride flush (NS) 0.9 % injection 10 mL  10 mL Intravenous PRN Heilingoetter, Cassandra L, PA-C        REVIEW OF SYSTEMS:  A 10+ POINT REVIEW OF SYSTEMS WAS OBTAINED including neurology, dermatology, psychiatry, cardiac, respiratory, lymph, extremities, GI, GU, musculoskeletal, constitutional, reproductive, HEENT.  He denies any pain along the left lateral chest region.  He denies any significant cough or hemoptysis.  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  He denies any right flank pain.   PHYSICAL EXAM:  height is 6' 1.5" (1.867 m) and weight is 153 lb 12.8 oz (69.8 kg). His temperature is 97.8 F (36.6 C). His blood pressure is 129/81 and his pulse is 72. His respiration is 20 and oxygen saturation is 99%.   General: Alert and oriented, in no acute distress HEENT: Head is normocephalic. Extraocular movements are intact.  Neck: Neck is supple, no palpable cervical or supraclavicular lymphadenopathy. Heart: Regular in rate and rhythm with no murmurs, rubs, or gallops. Chest: Clear to auscultation bilaterally, with no rhonchi, wheezes, or rales. Abdomen: Soft, nontender, nondistended, with no rigidity or guarding. Extremities: No cyanosis or edema. Lymphatics: see Neck Exam Skin: No concerning lesions. Musculoskeletal: symmetric strength and muscle tone throughout. Neurologic: Cranial nerves II through XII are grossly intact. No obvious focalities. Speech is fluent. Coordination is intact. Psychiatric: Judgment and insight are intact. Affect is appropriate.   ECOG = 1  0 - Asymptomatic (Fully active, able to carry on all predisease activities without restriction)  1 - Symptomatic but completely ambulatory (Restricted in physically strenuous activity but ambulatory and able to carry out work of a light or sedentary nature. For example, light housework,  office work)  2 - Symptomatic, <50% in bed during the day (Ambulatory and capable of all self care but unable to carry out any work activities. Up and about more than 50% of waking hours)  3 - Symptomatic, >50% in bed, but not bedbound (Capable of only limited self-care, confined to bed or chair 50% or more of waking hours)  4 - Bedbound (Completely disabled. Cannot carry on any self-care. Totally confined to bed or chair)  5 - Death   Santiago Glad MM, Creech RH, Tormey DC, et al. [phone]). "Toxicity and response criteria of the [facility]". Am. Evlyn Clines. Oncol. 5 (6): 649-55  LABORATORY DATA:  Lab Results  Component Value Date   WBC 8.3 08/21/2022   HGB 12.5 (L) 08/21/2022   HCT 37.8 (L) 08/21/2022   MCV 92.9 08/21/2022   PLT 186 08/21/2022   NEUTROABS 6.7 08/21/2022   Lab Results  Component Value  Date   NA 142 08/21/2022   K 3.0 (L) 08/21/2022   CL 109 08/21/2022   CO2 26 08/21/2022   GLUCOSE 122 (H) 08/21/2022   BUN 11 08/21/2022   CREATININE 1.17 08/21/2022   CALCIUM 9.2 08/21/2022      RADIOGRAPHY: NM PET Image Restage (PS) Skull Base to Thigh (F-18 FDG)  Result Date: 09/05/2022 CLINICAL DATA:  Subsequent treatment strategy for non-small cell left lung cancer status post chemotherapy and radiation therapy, with new right adrenal nodule on restaging CT. EXAM: NUCLEAR MEDICINE PET SKULL BASE TO THIGH TECHNIQUE: 7.8 mCi F-18 FDG was injected intravenously. Full-ring PET imaging was performed from the skull base to thigh after the radiotracer. CT data was obtained and used for attenuation correction and anatomic localization. Fasting blood glucose: 97 mg/dl COMPARISON:  16/01/9603 CT chest, abdomen and pelvis. FINDINGS: Mediastinal blood pool activity: SUV max 1.9 Liver activity: SUV max NA NECK: No hypermetabolic lymph nodes in the neck. Incidental CT findings: Right internal jugular Port-A-Cath terminates in the lower third of the SVC. CHEST: Hypermetabolic  indistinct 0.7 cm soft tissue lesion in the ventral left chest wall/pleural space with small erosion of the adjacent anterior left fourth rib with max SUV 14.1 (series 4/image 90), new from prior PET-CT. Low level non focal hypermetabolism associated with the extensive left parahilar radiation fibrosis compatible with inflammatory uptake. No additional hypermetabolic pulmonary findings. A few scattered solid right pulmonary nodules measuring up to 0.6 cm in the right middle lobe (series 7/image 50), below PET resolution, unchanged from recent CT, without significant FDG uptake. No enlarged or hypermetabolic axillary, mediastinal or hilar lymph nodes. Incidental CT findings: Moderate centrilobular emphysema. Coronary atherosclerosis. Atherosclerotic thoracic aorta with dilated 4.2 cm ascending thoracic aorta. ABDOMEN/PELVIS: Intensely hypermetabolic 2.1 cm right adrenal nodule with density 34 HU with max SUV 40.4 (series 4/image 124), new from 06/06/2021 PET-CT. No abnormal hypermetabolic activity within the liver, pancreas, left adrenal gland, or spleen. No hypermetabolic lymph nodes in the abdomen or pelvis. Incidental CT findings: Atherosclerotic abdominal aorta with 3.0 cm infrarenal abdominal aortic aneurysm. Nonobstructing bilateral renal stones, largest 5 mm in the lower right kidney. Small right renal cysts, largest 2.1 cm in the posterior upper right kidney, for which no follow-up imaging is recommended. Marked sigmoid diverticulosis. Mild prostatomegaly. SKELETON: No additional focal hypermetabolic activity to suggest skeletal metastasis. Incidental CT findings: None. IMPRESSION: 1. Intensely hypermetabolic 2.1 cm right adrenal metastasis, new from 06/06/2021 PET-CT. 2. Hypermetabolic indistinct 0.7 cm soft tissue lesion in the ventral left chest wall/pleural space with small erosion of the adjacent anterior left fourth rib, new from prior PET-CT, compatible with small ventral left chest wall metastasis. 3.  No additional sites of hypermetabolic metastatic disease. 4. A few scattered solid right pulmonary nodules measuring up to 0.6 cm in the right middle lobe, below PET resolution, unchanged from recent CT, without significant FDG uptake. Continued chest CT surveillance advised. 5. Infrarenal 3.0 cm abdominal aortic aneurysm. Recommend follow-up ultrasound every 3 years. 6. Aortic Atherosclerosis (ICD10-I70.0) and Emphysema (ICD10-J43.9). Electronically Signed   By: Delbert Phenix M.D.   On: 09/05/2022 14:32      IMPRESSION: Evidence of a new right adrenal gland metastasis - May 2024, along with a new hypermetabolic left chest wall/pleural space lesion   Stage IV (T3, N1, M1b) non-small cell lung cancer favoring squamous cell carcinoma presented with superior segment left lower lobe lung mass with invasion of the chest wall and destruction of the posterior left sixth and  seventh ribs as well as left hilar adenopathy and suspicious pleural metastasis diagnosed in March 2023   He would be a good candidate for additional radiation therapy.  Recent PET scan shows only 2 areas of involvement that being the right adrenal gland and left lateral rib cage area.  Given that these are the only sites of disease, aggressive radiation treatment is indicated,  that being SBRT.  Today, I talked to the patient and his wife about the findings and work-up thus far.  We discussed the natural history of non-small cell lung cancer and general treatment, highlighting the role of radiotherapy in the management.  We discussed the available radiation techniques, and focused on the details of logistics and delivery.  We reviewed the anticipated acute and late sequelae associated with radiation in this setting.  The patient was encouraged to ask questions that I answered to the best of my ability.  A patient consent form was discussed and signed.  We retained a copy for our records.  The patient would like to proceed with radiation and will  be scheduled for CT simulation.  PLAN: He Will return for CT simulation next week.  Anticipate 5 SBRT treatments directed at the right adrenal gland and left rib cage region.   35 minutes of total time was spent for this patient encounter, including preparation, face-to-face counseling with the patient and coordination of care, physical exam, and documentation of the encounter.   ------------------------------------------------  Billie Lade, PhD, MD  This document serves as a record of services personally performed by Antony Blackbird, MD. It was created on his behalf by Neena Rhymes, a trained medical scribe. The creation of this record is based on the scribe's personal observations and the provider's statements to them. This document has been checked and approved by the attending provider.

## 2022-09-24 ENCOUNTER — Encounter: Payer: Self-pay | Admitting: Radiation Oncology

## 2022-09-24 ENCOUNTER — Other Ambulatory Visit: Payer: Self-pay

## 2022-09-24 ENCOUNTER — Ambulatory Visit
Admission: RE | Admit: 2022-09-24 | Discharge: 2022-09-24 | Disposition: A | Discharge status: Home / Self Care | Payer: Medicare Other | Source: Ambulatory Visit | Attending: Radiation Oncology | Admitting: Radiation Oncology

## 2022-09-24 VITALS — BP 129/81 | HR 72 | Temp 97.8°F | Resp 20 | Ht 73.5 in | Wt 153.8 lb

## 2022-09-24 DIAGNOSIS — Z7952 Long term (current) use of systemic steroids: Secondary | ICD-10-CM | POA: Insufficient documentation

## 2022-09-24 DIAGNOSIS — F17210 Nicotine dependence, cigarettes, uncomplicated: Secondary | ICD-10-CM | POA: Diagnosis not present

## 2022-09-24 DIAGNOSIS — Z923 Personal history of irradiation: Secondary | ICD-10-CM | POA: Diagnosis not present

## 2022-09-24 DIAGNOSIS — C3492 Malignant neoplasm of unspecified part of left bronchus or lung: Secondary | ICD-10-CM | POA: Insufficient documentation

## 2022-09-24 DIAGNOSIS — Z79899 Other long term (current) drug therapy: Secondary | ICD-10-CM | POA: Insufficient documentation

## 2022-09-24 DIAGNOSIS — C7970 Secondary malignant neoplasm of unspecified adrenal gland: Secondary | ICD-10-CM

## 2022-09-24 DIAGNOSIS — Z8616 Personal history of COVID-19: Secondary | ICD-10-CM | POA: Insufficient documentation

## 2022-09-24 DIAGNOSIS — I7143 Infrarenal abdominal aortic aneurysm, without rupture: Secondary | ICD-10-CM | POA: Insufficient documentation

## 2022-09-24 DIAGNOSIS — C7971 Secondary malignant neoplasm of right adrenal gland: Secondary | ICD-10-CM | POA: Diagnosis not present

## 2022-09-24 DIAGNOSIS — C3432 Malignant neoplasm of lower lobe, left bronchus or lung: Secondary | ICD-10-CM | POA: Diagnosis not present

## 2022-09-30 ENCOUNTER — Other Ambulatory Visit: Payer: Self-pay

## 2022-09-30 ENCOUNTER — Ambulatory Visit
Admission: RE | Admit: 2022-09-30 | Discharge: 2022-09-30 | Disposition: A | Discharge status: Home / Self Care | Payer: Medicare Other | Source: Ambulatory Visit | Attending: Radiation Oncology | Admitting: Radiation Oncology

## 2022-09-30 DIAGNOSIS — C7971 Secondary malignant neoplasm of right adrenal gland: Secondary | ICD-10-CM | POA: Diagnosis not present

## 2022-09-30 DIAGNOSIS — F17210 Nicotine dependence, cigarettes, uncomplicated: Secondary | ICD-10-CM | POA: Insufficient documentation

## 2022-09-30 DIAGNOSIS — C782 Secondary malignant neoplasm of pleura: Secondary | ICD-10-CM | POA: Diagnosis not present

## 2022-09-30 DIAGNOSIS — C7989 Secondary malignant neoplasm of other specified sites: Secondary | ICD-10-CM | POA: Insufficient documentation

## 2022-09-30 DIAGNOSIS — C3492 Malignant neoplasm of unspecified part of left bronchus or lung: Secondary | ICD-10-CM | POA: Insufficient documentation

## 2022-09-30 DIAGNOSIS — C3432 Malignant neoplasm of lower lobe, left bronchus or lung: Secondary | ICD-10-CM | POA: Diagnosis not present

## 2022-09-30 DIAGNOSIS — C7970 Secondary malignant neoplasm of unspecified adrenal gland: Secondary | ICD-10-CM | POA: Diagnosis not present

## 2022-10-02 DIAGNOSIS — F17210 Nicotine dependence, cigarettes, uncomplicated: Secondary | ICD-10-CM | POA: Diagnosis not present

## 2022-10-02 DIAGNOSIS — C7970 Secondary malignant neoplasm of unspecified adrenal gland: Secondary | ICD-10-CM | POA: Diagnosis not present

## 2022-10-02 DIAGNOSIS — C3432 Malignant neoplasm of lower lobe, left bronchus or lung: Secondary | ICD-10-CM | POA: Diagnosis not present

## 2022-10-02 DIAGNOSIS — C3492 Malignant neoplasm of unspecified part of left bronchus or lung: Secondary | ICD-10-CM | POA: Diagnosis not present

## 2022-10-02 DIAGNOSIS — C7971 Secondary malignant neoplasm of right adrenal gland: Secondary | ICD-10-CM | POA: Diagnosis not present

## 2022-10-02 DIAGNOSIS — C782 Secondary malignant neoplasm of pleura: Secondary | ICD-10-CM | POA: Diagnosis not present

## 2022-10-02 DIAGNOSIS — C7989 Secondary malignant neoplasm of other specified sites: Secondary | ICD-10-CM | POA: Diagnosis not present

## 2022-10-06 ENCOUNTER — Telehealth: Payer: Self-pay

## 2022-10-06 ENCOUNTER — Other Ambulatory Visit: Payer: Self-pay | Admitting: Radiation Oncology

## 2022-10-06 MED ORDER — HYDROCODONE-ACETAMINOPHEN 10-325 MG PO TABS: 1.0000 | ORAL_TABLET | Freq: Four times a day (QID) | ORAL | 0 refills | Status: AC | PRN

## 2022-10-06 NOTE — Telephone Encounter (Signed)
Patient called in to request pain medication due increased pain to right side. Patient reports Tylenol is no longer effective. Patient requesting refill on Hydrocodone-APAP 10-325mg . Requesting medication be sent to [facility].

## 2022-10-14 ENCOUNTER — Ambulatory Visit
Admission: RE | Admit: 2022-10-14 | Discharge: 2022-10-14 | Disposition: A | Discharge status: Home / Self Care | Payer: Medicare Other | Source: Ambulatory Visit | Attending: Radiation Oncology | Admitting: Radiation Oncology

## 2022-10-14 ENCOUNTER — Other Ambulatory Visit: Payer: Self-pay

## 2022-10-14 DIAGNOSIS — C3432 Malignant neoplasm of lower lobe, left bronchus or lung: Secondary | ICD-10-CM | POA: Diagnosis not present

## 2022-10-14 DIAGNOSIS — Z510 Encounter for antineoplastic radiation therapy: Secondary | ICD-10-CM | POA: Insufficient documentation

## 2022-10-14 DIAGNOSIS — C3492 Malignant neoplasm of unspecified part of left bronchus or lung: Secondary | ICD-10-CM | POA: Insufficient documentation

## 2022-10-14 LAB — RAD ONC ARIA SESSION SUMMARY
Course Elapsed Days: 0
Plan Fractions Treated to Date: 1
Plan Fractions Treated to Date: 1
Plan Prescribed Dose Per Fraction: 8 Gy
Plan Prescribed Dose Per Fraction: 8 Gy
Plan Total Fractions Prescribed: 5
Plan Total Fractions Prescribed: 5
Plan Total Prescribed Dose: 40 Gy
Plan Total Prescribed Dose: 40 Gy
Reference Point Dosage Given to Date: 8 Gy
Reference Point Dosage Given to Date: 8 Gy
Reference Point Session Dosage Given: 8 Gy
Reference Point Session Dosage Given: 8 Gy
Session Number: 1

## 2022-10-15 ENCOUNTER — Ambulatory Visit: Payer: Medicare Other | Admitting: Radiation Oncology

## 2022-10-16 ENCOUNTER — Other Ambulatory Visit: Payer: Self-pay

## 2022-10-16 ENCOUNTER — Ambulatory Visit
Admission: RE | Admit: 2022-10-16 | Discharge: 2022-10-16 | Disposition: A | Discharge status: Home / Self Care | Payer: Medicare Other | Source: Ambulatory Visit | Attending: Radiation Oncology | Admitting: Radiation Oncology

## 2022-10-16 DIAGNOSIS — C3432 Malignant neoplasm of lower lobe, left bronchus or lung: Secondary | ICD-10-CM | POA: Diagnosis not present

## 2022-10-16 DIAGNOSIS — C3492 Malignant neoplasm of unspecified part of left bronchus or lung: Secondary | ICD-10-CM

## 2022-10-16 DIAGNOSIS — Z510 Encounter for antineoplastic radiation therapy: Secondary | ICD-10-CM | POA: Diagnosis not present

## 2022-10-16 LAB — RAD ONC ARIA SESSION SUMMARY
Course Elapsed Days: 2
Plan Fractions Treated to Date: 2
Plan Fractions Treated to Date: 2
Plan Prescribed Dose Per Fraction: 8 Gy
Plan Prescribed Dose Per Fraction: 8 Gy
Plan Total Fractions Prescribed: 5
Plan Total Fractions Prescribed: 5
Plan Total Prescribed Dose: 40 Gy
Plan Total Prescribed Dose: 40 Gy
Reference Point Dosage Given to Date: 16 Gy
Reference Point Dosage Given to Date: 16 Gy
Reference Point Session Dosage Given: 8 Gy
Reference Point Session Dosage Given: 8 Gy
Session Number: 2

## 2022-10-17 ENCOUNTER — Ambulatory Visit: Payer: Medicare Other | Admitting: Radiation Oncology

## 2022-10-20 ENCOUNTER — Ambulatory Visit
Admission: RE | Admit: 2022-10-20 | Discharge: 2022-10-20 | Disposition: A | Discharge status: Home / Self Care | Payer: Medicare Other | Source: Ambulatory Visit | Attending: Radiation Oncology | Admitting: Radiation Oncology

## 2022-10-20 ENCOUNTER — Other Ambulatory Visit: Payer: Self-pay

## 2022-10-20 DIAGNOSIS — Z510 Encounter for antineoplastic radiation therapy: Secondary | ICD-10-CM | POA: Diagnosis not present

## 2022-10-20 DIAGNOSIS — C3492 Malignant neoplasm of unspecified part of left bronchus or lung: Secondary | ICD-10-CM

## 2022-10-20 DIAGNOSIS — C3432 Malignant neoplasm of lower lobe, left bronchus or lung: Secondary | ICD-10-CM | POA: Diagnosis not present

## 2022-10-20 LAB — RAD ONC ARIA SESSION SUMMARY
Course Elapsed Days: 6
Plan Fractions Treated to Date: 3
Plan Fractions Treated to Date: 3
Plan Prescribed Dose Per Fraction: 8 Gy
Plan Prescribed Dose Per Fraction: 8 Gy
Plan Total Fractions Prescribed: 5
Plan Total Fractions Prescribed: 5
Plan Total Prescribed Dose: 40 Gy
Plan Total Prescribed Dose: 40 Gy
Reference Point Dosage Given to Date: 24 Gy
Reference Point Dosage Given to Date: 24 Gy
Reference Point Session Dosage Given: 8 Gy
Reference Point Session Dosage Given: 8 Gy
Session Number: 3

## 2022-10-22 ENCOUNTER — Ambulatory Visit
Admission: RE | Admit: 2022-10-22 | Discharge: 2022-10-22 | Disposition: A | Discharge status: Home / Self Care | Payer: Medicare Other | Source: Ambulatory Visit | Attending: Radiation Oncology | Admitting: Radiation Oncology

## 2022-10-22 ENCOUNTER — Other Ambulatory Visit: Payer: Self-pay

## 2022-10-22 ENCOUNTER — Ambulatory Visit: Payer: Medicare Other

## 2022-10-22 DIAGNOSIS — C3492 Malignant neoplasm of unspecified part of left bronchus or lung: Secondary | ICD-10-CM

## 2022-10-22 DIAGNOSIS — Z510 Encounter for antineoplastic radiation therapy: Secondary | ICD-10-CM | POA: Diagnosis not present

## 2022-10-22 DIAGNOSIS — C3432 Malignant neoplasm of lower lobe, left bronchus or lung: Secondary | ICD-10-CM | POA: Diagnosis not present

## 2022-10-22 LAB — RAD ONC ARIA SESSION SUMMARY
Course Elapsed Days: 8
Plan Fractions Treated to Date: 4
Plan Fractions Treated to Date: 4
Plan Prescribed Dose Per Fraction: 8 Gy
Plan Prescribed Dose Per Fraction: 8 Gy
Plan Total Fractions Prescribed: 5
Plan Total Fractions Prescribed: 5
Plan Total Prescribed Dose: 40 Gy
Plan Total Prescribed Dose: 40 Gy
Reference Point Dosage Given to Date: 32 Gy
Reference Point Dosage Given to Date: 32 Gy
Reference Point Session Dosage Given: 8 Gy
Reference Point Session Dosage Given: 8 Gy
Session Number: 4

## 2022-10-24 ENCOUNTER — Other Ambulatory Visit: Payer: Self-pay

## 2022-10-24 ENCOUNTER — Ambulatory Visit
Admission: RE | Admit: 2022-10-24 | Discharge: 2022-10-24 | Disposition: A | Discharge status: Home / Self Care | Payer: Medicare Other | Source: Ambulatory Visit | Attending: Radiation Oncology | Admitting: Radiation Oncology

## 2022-10-24 DIAGNOSIS — F17210 Nicotine dependence, cigarettes, uncomplicated: Secondary | ICD-10-CM | POA: Diagnosis not present

## 2022-10-24 DIAGNOSIS — C3432 Malignant neoplasm of lower lobe, left bronchus or lung: Secondary | ICD-10-CM | POA: Diagnosis not present

## 2022-10-24 DIAGNOSIS — Z510 Encounter for antineoplastic radiation therapy: Secondary | ICD-10-CM | POA: Diagnosis not present

## 2022-10-24 DIAGNOSIS — C7970 Secondary malignant neoplasm of unspecified adrenal gland: Secondary | ICD-10-CM | POA: Diagnosis not present

## 2022-10-24 LAB — RAD ONC ARIA SESSION SUMMARY
Course Elapsed Days: 10
Plan Fractions Treated to Date: 5
Plan Fractions Treated to Date: 5
Plan Prescribed Dose Per Fraction: 8 Gy
Plan Prescribed Dose Per Fraction: 8 Gy
Plan Total Fractions Prescribed: 5
Plan Total Fractions Prescribed: 5
Plan Total Prescribed Dose: 40 Gy
Plan Total Prescribed Dose: 40 Gy
Reference Point Dosage Given to Date: 40 Gy
Reference Point Dosage Given to Date: 40 Gy
Reference Point Session Dosage Given: 8 Gy
Reference Point Session Dosage Given: 8 Gy
Session Number: 5

## 2022-10-27 NOTE — Radiation Completion Notes (Signed)
Patient Name: Edward Beck, Edward Beck MRN: [id number] Date of Birth: 03/13/1946 Referring Physician: Si Gaul, M.D. Date of Service: 2022-10-27 Radiation Oncologist: Arnette Schaumann, M.D. [facility]                             RADIATION ONCOLOGY END OF TREATMENT NOTE     Diagnosis: C79.70 Secondary malignant neoplasm of unspecified adrenal gland; C34.32 Malignant neoplasm of lower lobe, left bronchus or lung Staging on 2021-07-11: Stage IV squamous cell carcinoma of left lung (HCC) T=cT3, N=cN1, M=cM1b Intent: Curative     ==========DELIVERED PLANS==========  First Treatment Date: 2022-10-14 - Last Treatment Date: 2022-10-24   Plan Name: Kidney_R_SBRT Site: Adrenal Gland, Right Technique: SBRT/SRT-IMRT Mode: Photon Dose Per Fraction: 8 Gy Prescribed Dose (Delivered / Prescribed): 40 Gy / 40 Gy Prescribed Fxs (Delivered / Prescribed): 5 / 5   Plan Name: Chest_L_SBRT Site: Chest, Left Technique: SBRT/SRT-IMRT Mode: Photon Dose Per Fraction: 8 Gy Prescribed Dose (Delivered / Prescribed): 40 Gy / 40 Gy Prescribed Fxs (Delivered / Prescribed): 5 / 5     ==========ON TREATMENT VISIT DATES========== 2022-10-14, 2022-10-14, 2022-10-16, 2022-10-16, 2022-10-20, 2022-10-20, 2022-10-22, 2022-10-22, 2022-10-22, 2022-10-24, 2022-10-24     ==========UPCOMING VISITS==========       ==========APPENDIX - ON TREATMENT VISIT NOTES==========   See weekly On Treatment Notes in Epic for details.

## 2022-10-28 ENCOUNTER — Inpatient Hospital Stay: Payer: Medicare Other | Attending: Internal Medicine

## 2022-10-28 DIAGNOSIS — Z452 Encounter for adjustment and management of vascular access device: Secondary | ICD-10-CM | POA: Diagnosis not present

## 2022-10-28 DIAGNOSIS — C3432 Malignant neoplasm of lower lobe, left bronchus or lung: Secondary | ICD-10-CM | POA: Insufficient documentation

## 2022-10-28 DIAGNOSIS — C7971 Secondary malignant neoplasm of right adrenal gland: Secondary | ICD-10-CM | POA: Diagnosis not present

## 2022-10-28 DIAGNOSIS — Z95828 Presence of other vascular implants and grafts: Secondary | ICD-10-CM

## 2022-10-28 MED ORDER — SODIUM CHLORIDE 0.9% FLUSH
10.0000 mL | Freq: Once | INTRAVENOUS | Status: AC
  Administered 2022-10-28: 10 mL

## 2022-10-28 MED ORDER — HEPARIN SOD (PORK) LOCK FLUSH 100 UNIT/ML IV SOLN
500.0000 [IU] | Freq: Once | INTRAVENOUS | Status: AC
  Administered 2022-10-28: 500 [IU]

## 2022-10-31 DIAGNOSIS — H2513 Age-related nuclear cataract, bilateral: Secondary | ICD-10-CM | POA: Diagnosis not present

## 2022-11-14 DIAGNOSIS — H25812 Combined forms of age-related cataract, left eye: Secondary | ICD-10-CM | POA: Diagnosis not present

## 2022-11-14 DIAGNOSIS — Z01818 Encounter for other preprocedural examination: Secondary | ICD-10-CM | POA: Diagnosis not present

## 2022-11-14 DIAGNOSIS — H25811 Combined forms of age-related cataract, right eye: Secondary | ICD-10-CM | POA: Diagnosis not present

## 2022-11-17 ENCOUNTER — Telehealth: Payer: Self-pay | Admitting: *Deleted

## 2022-11-17 NOTE — Telephone Encounter (Signed)
RETURNED PATIENT'S PHONE CALL, SPOKE WITH PATIENT'S WIFE - CAROLYN, FU APPT. HAS BEEN SCHEDULED FOR 11-24-22 @ 4 PM

## 2022-11-20 ENCOUNTER — Encounter: Payer: Self-pay | Admitting: Physician Assistant

## 2022-11-20 ENCOUNTER — Encounter: Payer: Self-pay | Admitting: Radiation Oncology

## 2022-11-21 ENCOUNTER — Encounter: Payer: Self-pay | Admitting: Physician Assistant

## 2022-11-24 ENCOUNTER — Ambulatory Visit: Payer: Medicare Other | Admitting: Radiation Oncology

## 2022-11-26 NOTE — Progress Notes (Signed)
  Radiation Oncology         (336) [phone] ________________________________  Name: Edward Beck MRN: [id number]  Date: 11/27/2022  DOB: [birth year]  End of Treatment Note  Diagnosis: The primary encounter diagnosis was Malignant neoplasm metastatic to adrenal gland, unspecified laterality (HCC). 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   Evidence of a new right adrenal gland metastasis - May 2024, along with a new hypermetabolic left chest wall/pleural space lesion    Stage IV (T3, N1, M1b) non-small cell lung cancer favoring squamous cell carcinoma presented with superior segment left lower lobe lung mass with invasion of the chest wall and destruction of the posterior left sixth and seventh ribs as well as left hilar adenopathy and suspicious pleural metastasis diagnosed in March 2023      Indication for treatment: palliative       Radiation treatment dates: 10/14/22 through 10/24/22  Site/dose:  1) Right adrenal gland - 40 Gy delivered in 5 Fx at 8 Gy/Fx 2) Left chest - 40 Gy delivered in 5 Fx at 8 Gy/Fx  Technique/Mode: SBRT/SRT-IMRT / Photon   Beams/energy: 6X-FFF  Narrative: The patient tolerated radiation treatment relatively well. During his final weekly treatment check on 10/22/22, the patient denied any side effects from treatment.   Plan: The patient has completed radiation treatment. The patient will return to radiation oncology clinic for routine followup in one month. I advised them to call or return sooner if they have any questions or concerns related to their recovery or treatment.  -----------------------------------  Billie Lade, PhD, MD  This document serves as a record of services personally performed by Antony Blackbird, MD. It was created on his behalf by Neena Rhymes, a trained medical scribe. The creation of this record is based on the scribe's personal observations and the provider's statements  to them. This document has been checked and approved by the attending provider.

## 2022-11-26 NOTE — Progress Notes (Signed)
Radiation Oncology         (336) [phone] ________________________________  Name: Edward Beck MRN: [id number]  Date: 11/27/2022  DOB: [birth year]  Follow-Up Visit Note  CC: Fayne Mediate, MD  No diagnosis found.  Diagnosis: The primary encounter diagnosis was Malignant neoplasm metastatic to adrenal gland, unspecified laterality (HCC). 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   Evidence of a new right adrenal gland metastasis - May 2024, along with a new hypermetabolic left chest wall/pleural space lesion    Stage IV (T3, N1, M1b) non-small cell lung cancer favoring squamous cell carcinoma presented with superior segment left lower lobe lung mass with invasion of the chest wall and destruction of the posterior left sixth and seventh ribs as well as left hilar adenopathy and suspicious pleural metastasis diagnosed in March 2023      Interval Since Last Radiation: 1 month and 3 days   Indication for treatment: Curative       Radiation treatment dates: 10/14/22 through 10/24/22 Site/dose:  1) Right adrenal gland - 40 Gy delivered in 5 Fx at 8 Gy/Fx 2) Left chest - 40 Gy delivered in 5 Fx at 8 Gy/Fx Technique/Mode: SBRT/SRT-IMRT / Photon  Beams/energy: 6X-FFF  Narrative:  The patient returns today for routine follow-up.  The patient tolerated radiation treatment relatively well. During his final weekly treatment check on 10/22/22, the patient denied any side effects from treatment.           No other significant interval history since the patient completed radiation therapy or since the patient was seen in re-consultation on 09/24/22.   ***                        Allergies:  is allergic to tamiflu [oseltamivir phosphate].  Meds: Current Outpatient Medications  Medication Sig Dispense Refill   omeprazole (PRILOSEC) 20 MG capsule Take 1 capsule (20 mg total) by mouth daily. (Patient not  taking: Reported on 09/24/2022) 30 capsule 2   Cyanocobalamin (B-12) 1000 MCG TABS Take 1 tablet by mouth daily.     diphenoxylate-atropine (LOMOTIL) 2.5-0.025 MG tablet TAKE 1 TABLET BY MOUTH FOUR TIMES DAILY AS NEEDED FOR DIARRHEA OR LOOSE STOOLS 30 tablet 1   finasteride (PROSCAR) 5 MG tablet Take 1 tablet (5 mg total) by mouth daily. 90 tablet 3   gabapentin (NEURONTIN) 100 MG capsule Take 1 capsule (100 mg total) by mouth 3 (three) times daily. (Patient not taking: Reported on 09/24/2022) 90 capsule 2   lidocaine-prilocaine (EMLA) cream Apply to the Port-A-Cath site 30 minutes before chemotherapy. 30 g 0   mirtazapine (REMERON) 15 MG tablet Take 1 tablet (15 mg total) by mouth at bedtime. (Patient not taking: Reported on 09/24/2022) 30 tablet 2   potassium chloride SA (KLOR-CON M) 20 MEQ tablet Take 1 tablet (20 mEq total) by mouth 2 (two) times daily. (Patient not taking: Reported on 09/24/2022) 14 tablet 0   predniSONE (DELTASONE) 10 MG tablet 6 tablet p.o. daily for 7 days followed by 5 tablet p.o. daily for 7 days followed by 4 tablet p.o. daily for 7 days followed by 2 tablets p.o. daily for 7 days followed by 1 tablet p.o. daily for 7 days. 150 tablet 0   prochlorperazine (COMPAZINE) 10 MG tablet Take 1 tablet (10 mg total) by mouth every 6 (six) hours as needed for nausea or vomiting. (Patient not  taking: Reported on 09/24/2022) 30 tablet 0   triamcinolone cream (KENALOG) 0.1 % Apply 1 application  topically 2 (two) times daily as needed (dry skin). (Patient not taking: Reported on 09/24/2022)     umeclidinium-vilanterol (ANORO ELLIPTA) 62.5-25 MCG/ACT AEPB Inhale 1 puff into the lungs daily. (Patient not taking: Reported on 09/24/2022) 7 each 0   No current facility-administered medications for this encounter.   Facility-Administered Medications Ordered in Other Encounters  Medication Dose Route Frequency Provider Last Rate Last Admin   heparin lock flush 100 unit/mL  500 Units Intravenous  Once Heilingoetter, Cassandra L, PA-C       sodium chloride flush (NS) 0.9 % injection 10 mL  10 mL Intravenous PRN Heilingoetter, Cassandra L, PA-C        Physical Findings: The patient is in no acute distress. Patient is alert and oriented.  vitals were not taken for this visit. .  No significant changes. Lungs are clear to auscultation bilaterally. Heart has regular rate and rhythm. No palpable cervical, supraclavicular, or axillary adenopathy. Abdomen soft, non-tender, normal bowel sounds.   Lab Findings: Lab Results  Component Value Date   WBC 8.3 08/21/2022   HGB 12.5 (L) 08/21/2022   HCT 37.8 (L) 08/21/2022   MCV 92.9 08/21/2022   PLT 186 08/21/2022    Radiographic Findings: No results found.  Impression: Evidence of a new right adrenal gland metastasis - May 2024, along with a new hypermetabolic left chest wall/pleural space lesion    Stage IV (T3, N1, M1b) non-small cell lung cancer favoring squamous cell carcinoma presented with superior segment left lower lobe lung mass with invasion of the chest wall and destruction of the posterior left sixth and seventh ribs as well as left hilar adenopathy and suspicious pleural metastasis diagnosed in March 2023    The patient is recovering from the effects of radiation.  ***  Plan:  ***   *** minutes of total time was spent for this patient encounter, including preparation, face-to-face counseling with the patient and coordination of care, physical exam, and documentation of the encounter. ____________________________________  Billie Lade, PhD, MD  This document serves as a record of services personally performed by Antony Blackbird, MD. It was created on his behalf by Neena Rhymes, a trained medical scribe. The creation of this record is based on the scribe's personal observations and the provider's statements to them. This document has been checked and approved by the attending provider.

## 2022-11-27 ENCOUNTER — Encounter: Payer: Self-pay | Admitting: Radiation Oncology

## 2022-11-27 ENCOUNTER — Ambulatory Visit
Admission: RE | Admit: 2022-11-27 | Discharge: 2022-11-27 | Disposition: A | Discharge status: Home / Self Care | Payer: Medicare Other | Source: Ambulatory Visit | Attending: Radiation Oncology | Admitting: Radiation Oncology

## 2022-11-27 VITALS — BP 164/90 | HR 69 | Temp 97.1°F | Resp 18 | Ht 73.5 in | Wt 145.1 lb

## 2022-11-27 DIAGNOSIS — C7971 Secondary malignant neoplasm of right adrenal gland: Secondary | ICD-10-CM | POA: Insufficient documentation

## 2022-11-27 DIAGNOSIS — C3492 Malignant neoplasm of unspecified part of left bronchus or lung: Secondary | ICD-10-CM

## 2022-11-27 DIAGNOSIS — Z79899 Other long term (current) drug therapy: Secondary | ICD-10-CM | POA: Diagnosis not present

## 2022-11-27 DIAGNOSIS — Z7952 Long term (current) use of systemic steroids: Secondary | ICD-10-CM | POA: Insufficient documentation

## 2022-11-27 DIAGNOSIS — C3432 Malignant neoplasm of lower lobe, left bronchus or lung: Secondary | ICD-10-CM | POA: Diagnosis not present

## 2022-11-27 DIAGNOSIS — Z923 Personal history of irradiation: Secondary | ICD-10-CM | POA: Insufficient documentation

## 2022-11-27 NOTE — Progress Notes (Signed)
Edward Beck is here today for follow up post radiation to the left chest and right kidney.  Patient completed treatment on 10/24/22.   Does the patient complain of any of the following: Pain: Reports tenderness to both nipples.  Shortness of breath w/wo exertion: No Cough: No Hemoptysis: No Pain with swallowing: No Swallowing/choking concerns: No Appetite: Good Energy Level: Improving  Post radiation skin Changes: No Bladder issues: No    Additional comments if applicable:   BP (!) 164/90 (BP Location: Left Arm, Patient Position: Sitting)   Pulse 69   Temp (!) 97.1 F (36.2 C) (Temporal)   Resp 18   Ht 6' 1.5" (1.867 m)   Wt 145 lb 2 oz (65.8 kg)   SpO2 100%   BMI 18.89 kg/m

## 2022-12-01 DIAGNOSIS — H25811 Combined forms of age-related cataract, right eye: Secondary | ICD-10-CM | POA: Diagnosis not present

## 2022-12-09 ENCOUNTER — Inpatient Hospital Stay: Payer: Medicare Other | Attending: Internal Medicine

## 2022-12-09 DIAGNOSIS — Z95828 Presence of other vascular implants and grafts: Secondary | ICD-10-CM

## 2022-12-09 DIAGNOSIS — C3432 Malignant neoplasm of lower lobe, left bronchus or lung: Secondary | ICD-10-CM | POA: Insufficient documentation

## 2022-12-09 LAB — CBC WITH DIFFERENTIAL (CANCER CENTER ONLY)
Abs Immature Granulocytes: 0.07 10*3/uL (ref 0.00–0.07)
Basophils Absolute: 0.1 10*3/uL (ref 0.0–0.1)
Basophils Relative: 2 %
Eosinophils Absolute: 0.2 10*3/uL (ref 0.0–0.5)
Eosinophils Relative: 3 %
HCT: 35.6 % — ABNORMAL LOW (ref 39.0–52.0)
Hemoglobin: 11.8 g/dL — ABNORMAL LOW (ref 13.0–17.0)
Immature Granulocytes: 1 %
Lymphocytes Relative: 10 %
Lymphs Abs: 0.7 10*3/uL (ref 0.7–4.0)
MCH: 30.5 pg (ref 26.0–34.0)
MCHC: 33.1 g/dL (ref 30.0–36.0)
MCV: 92 fL (ref 80.0–100.0)
Monocytes Absolute: 0.5 10*3/uL (ref 0.1–1.0)
Monocytes Relative: 8 %
Neutro Abs: 5.2 10*3/uL (ref 1.7–7.7)
Neutrophils Relative %: 76 %
Platelet Count: 152 10*3/uL (ref 150–400)
RBC: 3.87 MIL/uL — ABNORMAL LOW (ref 4.22–5.81)
RDW: 14.5 % (ref 11.5–15.5)
WBC Count: 6.8 10*3/uL (ref 4.0–10.5)
nRBC: 0 % (ref 0.0–0.2)

## 2022-12-09 LAB — CMP (CANCER CENTER ONLY)
ALT: 8 U/L (ref 0–44)
AST: 15 U/L (ref 15–41)
Albumin: 3.9 g/dL (ref 3.5–5.0)
Alkaline Phosphatase: 76 U/L (ref 38–126)
Anion gap: 4 — ABNORMAL LOW (ref 5–15)
BUN: 15 mg/dL (ref 8–23)
CO2: 27 mmol/L (ref 22–32)
Calcium: 9.7 mg/dL (ref 8.9–10.3)
Chloride: 108 mmol/L (ref 98–111)
Creatinine: 1.23 mg/dL (ref 0.61–1.24)
GFR, Estimated: 60 mL/min — ABNORMAL LOW (ref >60)
Glucose, Bld: 90 mg/dL (ref 70–99)
Potassium: 3.6 mmol/L (ref 3.5–5.1)
Sodium: 139 mmol/L (ref 135–145)
Total Bilirubin: 0.4 mg/dL (ref 0.3–1.2)
Total Protein: 6.7 g/dL (ref 6.5–8.1)

## 2022-12-09 MED ORDER — SODIUM CHLORIDE 0.9% FLUSH
10.0000 mL | Freq: Once | INTRAVENOUS | Status: AC
  Administered 2022-12-09: 10 mL

## 2022-12-09 MED ORDER — HEPARIN SOD (PORK) LOCK FLUSH 100 UNIT/ML IV SOLN
500.0000 [IU] | Freq: Once | INTRAVENOUS | Status: AC
  Administered 2022-12-09: 500 [IU]

## 2022-12-22 DIAGNOSIS — H2511 Age-related nuclear cataract, right eye: Secondary | ICD-10-CM | POA: Diagnosis not present

## 2022-12-22 DIAGNOSIS — H25812 Combined forms of age-related cataract, left eye: Secondary | ICD-10-CM | POA: Diagnosis not present

## 2023-01-05 ENCOUNTER — Ambulatory Visit (HOSPITAL_COMMUNITY)
Admission: RE | Admit: 2023-01-05 | Discharge: 2023-01-05 | Disposition: A | Discharge status: Home / Self Care | Payer: Medicare Other | Source: Ambulatory Visit | Attending: Physician Assistant | Admitting: Physician Assistant

## 2023-01-05 DIAGNOSIS — N281 Cyst of kidney, acquired: Secondary | ICD-10-CM | POA: Diagnosis not present

## 2023-01-05 DIAGNOSIS — C7970 Secondary malignant neoplasm of unspecified adrenal gland: Secondary | ICD-10-CM | POA: Insufficient documentation

## 2023-01-05 DIAGNOSIS — C3490 Malignant neoplasm of unspecified part of unspecified bronchus or lung: Secondary | ICD-10-CM | POA: Insufficient documentation

## 2023-01-05 DIAGNOSIS — I700 Atherosclerosis of aorta: Secondary | ICD-10-CM | POA: Diagnosis not present

## 2023-01-05 MED ORDER — IOHEXOL 300 MG/ML  SOLN
100.0000 mL | Freq: Once | INTRAMUSCULAR | Status: AC | PRN
  Administered 2023-01-05: 100 mL via INTRAVENOUS

## 2023-01-08 ENCOUNTER — Inpatient Hospital Stay: Payer: Medicare Other

## 2023-01-08 ENCOUNTER — Inpatient Hospital Stay: Payer: Medicare Other | Attending: Internal Medicine | Admitting: Internal Medicine

## 2023-01-08 VITALS — BP 114/68 | HR 66 | Temp 97.6°F | Resp 16 | Ht 73.5 in | Wt 144.4 lb

## 2023-01-08 DIAGNOSIS — Z95828 Presence of other vascular implants and grafts: Secondary | ICD-10-CM

## 2023-01-08 DIAGNOSIS — C3490 Malignant neoplasm of unspecified part of unspecified bronchus or lung: Secondary | ICD-10-CM

## 2023-01-08 DIAGNOSIS — C3432 Malignant neoplasm of lower lobe, left bronchus or lung: Secondary | ICD-10-CM | POA: Insufficient documentation

## 2023-01-08 DIAGNOSIS — C7951 Secondary malignant neoplasm of bone: Secondary | ICD-10-CM | POA: Diagnosis not present

## 2023-01-08 MED ORDER — SODIUM CHLORIDE 0.9% FLUSH
10.0000 mL | Freq: Once | INTRAVENOUS | Status: AC
  Administered 2023-01-08: 10 mL

## 2023-01-08 MED ORDER — HEPARIN SOD (PORK) LOCK FLUSH 100 UNIT/ML IV SOLN
500.0000 [IU] | Freq: Once | INTRAVENOUS | Status: AC
  Administered 2023-01-08: 500 [IU]

## 2023-01-08 NOTE — Progress Notes (Signed)
[facility] Telephone:(336) [phone]   Fax:(336) [phone]  OFFICE PROGRESS NOTE  [name], PA-C [address] [place] [address]  DIAGNOSIS: Stage IV (T3, N1, M1b) non-small cell lung cancer favoring squamous cell carcinoma presented with superior segment left lower lobe lung mass with invasion of the chest wall and destruction of the posterior left sixth and seventh ribs as well as left hilar adenopathy and suspicious pleural metastasis diagnosed in March 2023  PRIOR THERAPY:  1) Palliative radiotherapy to the left lower lobe lung mass with chest wall invasion under the care of Dr. Roselind Messier. 2) Palliative systemic chemotherapy with carboplatin for AUC of 5, paclitaxel 175 Mg/M2 and Libtayo (Cempilimab) 350 Mg IV every 3 weeks with Neulasta support.  First dose was given on July 18, 2021.  Status post 4 cycles. 3) Maintenance treatment with Libtayo (Cempilimab) 350 Mg IV every 3 weeks, first dose October 10, 2021 status post 3 cycles.  Discontinued secondary to intolerance with immunotherapy mediated colitis and persistent diarrhea. 4) status post palliative radiotherapy to the progressive disease in the right adrenal gland as well as the left chest under the care of Dr. Roselind Messier completed October 24, 2022  CURRENT THERAPY: Observation.   INTERVAL HISTORY:  KYLEN SCHLIEP 77 y.o. male returns to the clinic today for follow-up visit accompanied by his wife.Discussed the use of AI scribe software for clinical note transcription with the patient, who gave verbal consent to proceed.  History of Present Illness   Delvon Chipps, a 77 year old patient with a history of stage four lung cancer, presents for a follow-up visit. The patient was initially treated with chemotherapy and immunotherapy, but the latter was discontinued due to severe diarrhea. The most recent PET and CT scans revealed an enlarged right adrenal gland and a nodule in the left lung, both of which were  treated with radiation by Dr. Roselind Messier.  The patient also reports tenderness in the nipple area, which was thought to be potentially related to his prostate medication, Proscar, which he has been taking for four years. The patient was also previously on prednisone, but this has since been discontinued. No other new complaints were reported.  This was not present in previous scans and is being closely monitored due to concerns of potential cancer. The patient remains active, working up to three days a week, and no other evidence of metastatic disease was found.       MEDICAL HISTORY: Past Medical History:  Diagnosis Date   Condyloma acuminatum of scrotum    COVID 2021   December 2022 - mild   History of radiation therapy    Left Lung- 07/23/21-08/02/21- Dr. Antony Blackbird   History of radiation therapy    Left chest, right Kidney- 10/14/22-10/24/22- Dr. Antony Blackbird   Lung cancer [facility])    Pneumonia    Scrotal lesion     ALLERGIES:  is allergic to tamiflu [oseltamivir phosphate].  MEDICATIONS:  Current Outpatient Medications  Medication Sig Dispense Refill   omeprazole (PRILOSEC) 20 MG capsule Take 1 capsule (20 mg total) by mouth daily. (Patient not taking: Reported on 09/24/2022) 30 capsule 2   Cyanocobalamin (B-12) 1000 MCG TABS Take 1 tablet by mouth daily.     diphenoxylate-atropine (LOMOTIL) 2.5-0.025 MG tablet TAKE 1 TABLET BY MOUTH FOUR TIMES DAILY AS NEEDED FOR DIARRHEA OR LOOSE STOOLS 30 tablet 1   finasteride (PROSCAR) 5 MG tablet Take 1 tablet (5 mg total) by mouth daily. 90  tablet 3   gabapentin (NEURONTIN) 100 MG capsule Take 1 capsule (100 mg total) by mouth 3 (three) times daily. (Patient not taking: Reported on 09/24/2022) 90 capsule 2   lidocaine-prilocaine (EMLA) cream Apply to the Port-A-Cath site 30 minutes before chemotherapy. (Patient not taking: Reported on 11/27/2022) 30 g 0   mirtazapine (REMERON) 15 MG tablet Take 1 tablet (15 mg total) by mouth at bedtime.  (Patient not taking: Reported on 09/24/2022) 30 tablet 2   potassium chloride SA (KLOR-CON M) 20 MEQ tablet Take 1 tablet (20 mEq total) by mouth 2 (two) times daily. (Patient not taking: Reported on 09/24/2022) 14 tablet 0   predniSONE (DELTASONE) 10 MG tablet 6 tablet p.o. daily for 7 days followed by 5 tablet p.o. daily for 7 days followed by 4 tablet p.o. daily for 7 days followed by 2 tablets p.o. daily for 7 days followed by 1 tablet p.o. daily for 7 days. (Patient not taking: Reported on 11/27/2022) 150 tablet 0   prochlorperazine (COMPAZINE) 10 MG tablet Take 1 tablet (10 mg total) by mouth every 6 (six) hours as needed for nausea or vomiting. (Patient not taking: Reported on 09/24/2022) 30 tablet 0   triamcinolone cream (KENALOG) 0.1 % Apply 1 application  topically 2 (two) times daily as needed (dry skin). (Patient not taking: Reported on 09/24/2022)     umeclidinium-vilanterol (ANORO ELLIPTA) 62.5-25 MCG/ACT AEPB Inhale 1 puff into the lungs daily. (Patient not taking: Reported on 09/24/2022) 7 each 0   No current facility-administered medications for this visit.   Facility-Administered Medications Ordered in Other Visits  Medication Dose Route Frequency Provider Last Rate Last Admin   heparin lock flush 100 unit/mL  500 Units Intravenous Once Heilingoetter, Cassandra L, PA-C       sodium chloride flush (NS) 0.9 % injection 10 mL  10 mL Intravenous PRN Heilingoetter, Cassandra L, PA-C        SURGICAL HISTORY:  Past Surgical History:  Procedure Laterality Date   BRONCHIAL BIOPSY  07/02/2021   Procedure: BRONCHIAL BIOPSIES;  Surgeon: Josephine Igo, DO;  Location: [facility] END[facility];  Service: Pulmonary;;   BRONCHIAL BRUSHINGS  07/02/2021   Procedure: BRONCHIAL BRUSHINGS;  Surgeon: Josephine Igo, DO;  Location: [facility];  Service: Pulmonary;;   BRONCHIAL NEEDLE ASPIRATION BIOPSY  07/02/2021   Procedure: BRONCHIAL NEEDLE ASPIRATION BIOPSIES;  Surgeon: Josephine Igo, DO;  Location: [facility]  ENDOSCOPY;  Service: Pulmonary;;   HERNIA REPAIR  2009   left inguinal   IR IMAGING GUIDED PORT INSERTION  07/16/2021   SCROTAL EXPLORATION  11/21/2011   Procedure: SCROTUM EXPLORATION;  Surgeon: Lindaann Slough, MD;  Location: [facility] ORS;  Service: Urology;  Laterality: N/A;  EXCISION, BIOPSY SCROTAL CONDYLOMA    VIDEO BRONCHOSCOPY WITH ENDOBRONCHIAL ULTRASOUND Bilateral 07/02/2021   Procedure: VIDEO BRONCHOSCOPY WITH ENDOBRONCHIAL ULTRASOUND;  Surgeon: Josephine Igo, DO;  Location: [facility] ENDOS[facility];  Service: Pulmonary;  Laterality: Bilateral;    REVIEW OF SYSTEMS:  Constitutional: negative Eyes: negative Ears, nose, mouth, throat, and face: negative Respiratory: positive for cough Cardiovascular: negative Gastrointestinal: negative Genitourinary:negative Integument/breast: negative Hematologic/lymphatic: negative Musculoskeletal:positive for back pain Neurological: negative Behavioral/Psych: negative Endocrine: negative Allergic/Immunologic: negative   PHYSICAL EXAMINATION: General appearance: alert, cooperative, fatigued, and no distress Head: Normocephalic, without obvious abnormality, atraumatic Neck: no adenopathy, no JVD, supple, symmetrical, trachea midline, and thyroid not enlarged, symmetric, no tenderness/mass/nodules Lymph nodes: Cervical, supraclavicular, and axillary nodes normal. Resp: clear to auscultation bilaterally Back: symmetric, no curvature. ROM normal. No CVA tenderness.  Cardio: regular rate and rhythm, S1, S2 normal, no murmur, click, rub or gallop GI: soft, non-tender; bowel sounds normal; no masses,  no organomegaly Extremities: extremities normal, atraumatic, no cyanosis or edema Neurologic: Alert and oriented X 3, normal strength and tone. Normal symmetric reflexes. Normal coordination and gait  ECOG PERFORMANCE STATUS: 1 - Symptomatic but completely ambulatory  Blood pressure 114/68, pulse 66, temperature 97.6 F (36.4 C), temperature source Oral, resp.  rate 16, height 6' 1.5" (1.867 m), weight 144 lb 6.4 oz (65.5 kg), SpO2 100%.  LABORATORY DATA: Lab Results  Component Value Date   WBC 6.8 12/09/2022   HGB 11.8 (L) 12/09/2022   HCT 35.6 (L) 12/09/2022   MCV 92.0 12/09/2022   PLT 152 12/09/2022      Chemistry      Component Value Date/Time   NA 139 12/09/2022 1244   K 3.6 12/09/2022 1244   CL 108 12/09/2022 1244   CO2 27 12/09/2022 1244   BUN 15 12/09/2022 1244   CREATININE 1.23 12/09/2022 1244      Component Value Date/Time   CALCIUM 9.7 12/09/2022 1244   ALKPHOS 76 12/09/2022 1244   AST 15 12/09/2022 1244   ALT 8 12/09/2022 1244   BILITOT 0.4 12/09/2022 1244       RADIOGRAPHIC STUDIES: CT CHEST ABDOMEN PELVIS W CONTRAST  Result Date: 01/08/2023 CLINICAL DATA:  Restaging non-small cell cancer. * Tracking Code: BO * EXAM: CT CHEST, ABDOMEN, AND PELVIS WITH CONTRAST TECHNIQUE: Multidetector CT imaging of the chest, abdomen and pelvis was performed following the standard protocol during bolus administration of intravenous contrast. RADIATION DOSE REDUCTION: This exam was performed according to the departmental dose-optimization program which includes automated exposure control, adjustment of the mA and/or kV according to patient size and/or use of iterative reconstruction technique. CONTRAST:  OMNIPAQUE IOHEXOL 300 MG/ML  SOLN COMPARISON:  Numerous prior imaging studies. The most recent examination is a PET-CT from 08/29/2022 FINDINGS: CT CHEST FINDINGS Cardiovascular: The heart is normal in size. No pericardial effusion. The aorta is normal in caliber. No dissection. Stable atherosclerotic calcifications. Stable coronary artery calcifications. Mediastinum/Nodes: There is a new 14 mm infrahilar lymph node on the right side on image 38/2. No other enlarged mediastinal hilar lymph nodes are identified. The esophagus is grossly normal. Lungs/Pleura: Stable extensive post treatment changes involving the left upper lobe with  radiation fibrosis and bronchiectasis. No new pulmonary nodules or pulmonary lesions. Stable subpleural atelectasis or scarring in left lobe. No pleural effusion. Unchanged 6 mm nodule in the right middle lobe. Musculoskeletal: The erosive lesion involving the left anterior fourth rib appears stable. No discrete associated pleural nodule. No new bone lesions are identified. Mild progressive symmetric bilateral gynecomastia. CT ABDOMEN PELVIS FINDINGS Hepatobiliary: No hepatic metastatic lesions. Pancreas: No significant findings. Spleen: Normal Adrenals/Urinary Tract: The right adrenal gland nodule is no longer identified. The left adrenal gland is normal. Stable simple renal cysts not requiring any further imaging evaluation or follow-up. Stable right renal calculi. The bladder is unremarkable. Stomach/Bowel: No significant or acute findings. Stable descending and sigmoid colon diverticulosis. Vascular/Lymphatic: Stable advanced atherosclerotic calcification involving the aorta and iliac arteries but no aneurysm or dissection. No abdominal or pelvic lymphadenopathy. No inguinal adenopathy. Reproductive: Stable mild prostate gland enlargement with median lobe hypertrophy impressing on the base of bladder. Other: No pelvic mass or adenopathy. No free pelvic fluid collections. No inguinal mass or adenopathy. No abdominal wall hernia or subcutaneous lesions. Musculoskeletal: No worrisome lytic or sclerotic bone  lesions. IMPRESSION: 1. Stable extensive post treatment changes involving the left upper lobe with radiation fibrosis and bronchiectasis. 2. New 14 mm right infrahilar lymph node. Recommend attention future studies. 3. Stable 6 mm right middle lobe pulmonary nodule. 4. Stable erosive lesion involving the left anterior fourth rib. No new bone lesions are identified. 5. The right adrenal gland nodule is no longer identified. 6. No findings for abdominal/pelvic metastatic disease. Aortic Atherosclerosis  (ICD10-I70.0).  Foot Electronically Signed   By: Rudie Meyer M.D.   On: 01/08/2023 09:59    ASSESSMENT AND PLAN: This is a very pleasant 77 years old white male with Stage IV (T3, N1, M1b) non-small cell lung cancer favoring squamous cell carcinoma presented with superior segment left lower lobe lung mass with invasion of the chest wall and destruction of the posterior left sixth and seventh ribs as well as left hilar adenopathy and suspicious pleural metastasis diagnosed in March 2023 He is currently undergoing palliative radiotherapy to the left lower lobe lung mass with chest wall invasion under the care of Dr. Roselind Messier. He is also undergoing systemic chemotherapy with carboplatin for AUC of 5, paclitaxel 175 Mg/M2 and Libtayo (Cempilimab) 350 Mg IV every 3 weeks.  He started the first dose of his treatment on July 18, 2021.  Status post 4 cycles.   The patient started maintenance treatment with single agent Libtayo (Cempilimab) 350 Mg IV every 3 weeks on October 10, 2021 status post 3 cycles.  His treatment was discontinued secondary to immunotherapy mediated colitis and significant diarrhea which was treated with a tapered dose of prednisone and improved. He is also status post palliative radiotherapy to the progressive disease in the right adrenal gland as well as the left chest under the care of Dr. Roselind Messier completed October 24, 2022.  He had repeat CT scan of the chest, abdomen and pelvis performed recently.  I personally and independently reviewed the scan images and discussed the result and showed the images to the patient and his wife. His scan showed stable disease with stable extensive posttreatment changes involving the left upper lobe with radiation fibrosis and bronchiectasis but there was new 1.4 cm right infrahilar lymph node concerning for possible disease recurrence. I discussed the scan result with the patient and his wife.    Stage IV Lung Cancer Previously treated with chemotherapy and  immunotherapy, which was discontinued due to severe diarrhea. Recent radiation therapy to right adrenal gland and left lung nodule. New soft tissue nodule in right hilar area on recent scan, concerning for possible cancer. -Repeat chest scan in two months to monitor right hilar nodule.  Benign Prostatic Hyperplasia On Proscar 5mg  daily for four years. Reports nipple tenderness, which may be a side effect of the medication. -Continue Proscar 5mg  daily.  General Health Maintenance / Followup Plans -Schedule follow-up appointment in two months, with chest scan one week prior to appointment.   He was advised to call immediately if he has any concerning symptoms in the interval. The patient voices understanding of current disease status and treatment options and is in agreement with the current care plan.  All questions were answered. The patient knows to call the clinic with any problems, questions or concerns. We can certainly see the patient much sooner if necessary.  The total time spent in the appointment was 30 minutes.  Disclaimer: This note was dictated with voice recognition software. Similar sounding words can inadvertently be transcribed and may not be corrected upon review.

## 2023-01-17 ENCOUNTER — Other Ambulatory Visit: Payer: Self-pay | Admitting: Physician Assistant

## 2023-01-17 DIAGNOSIS — R197 Diarrhea, unspecified: Secondary | ICD-10-CM

## 2023-01-19 ENCOUNTER — Encounter: Payer: Self-pay | Admitting: Medical Oncology

## 2023-03-02 ENCOUNTER — Inpatient Hospital Stay: Payer: Medicare Other | Attending: Internal Medicine

## 2023-03-02 ENCOUNTER — Ambulatory Visit (HOSPITAL_COMMUNITY)
Admission: RE | Admit: 2023-03-02 | Discharge: 2023-03-02 | Disposition: A | Discharge status: Home / Self Care | Payer: Medicare Other | Source: Ambulatory Visit | Attending: Internal Medicine | Admitting: Internal Medicine

## 2023-03-02 DIAGNOSIS — J439 Emphysema, unspecified: Secondary | ICD-10-CM | POA: Diagnosis not present

## 2023-03-02 DIAGNOSIS — C3490 Malignant neoplasm of unspecified part of unspecified bronchus or lung: Secondary | ICD-10-CM | POA: Insufficient documentation

## 2023-03-02 DIAGNOSIS — I700 Atherosclerosis of aorta: Secondary | ICD-10-CM | POA: Diagnosis not present

## 2023-03-02 DIAGNOSIS — C7951 Secondary malignant neoplasm of bone: Secondary | ICD-10-CM | POA: Insufficient documentation

## 2023-03-02 DIAGNOSIS — C3432 Malignant neoplasm of lower lobe, left bronchus or lung: Secondary | ICD-10-CM | POA: Insufficient documentation

## 2023-03-02 LAB — CBC WITH DIFFERENTIAL (CANCER CENTER ONLY)
Abs Immature Granulocytes: 0.05 10*3/uL (ref 0.00–0.07)
Basophils Absolute: 0.1 10*3/uL (ref 0.0–0.1)
Basophils Relative: 1 %
Eosinophils Absolute: 0.5 10*3/uL (ref 0.0–0.5)
Eosinophils Relative: 6 %
HCT: 38.8 % — ABNORMAL LOW (ref 39.0–52.0)
Hemoglobin: 12.8 g/dL — ABNORMAL LOW (ref 13.0–17.0)
Immature Granulocytes: 1 %
Lymphocytes Relative: 8 %
Lymphs Abs: 0.6 10*3/uL — ABNORMAL LOW (ref 0.7–4.0)
MCH: 30.7 pg (ref 26.0–34.0)
MCHC: 33 g/dL (ref 30.0–36.0)
MCV: 93 fL (ref 80.0–100.0)
Monocytes Absolute: 0.6 10*3/uL (ref 0.1–1.0)
Monocytes Relative: 7 %
Neutro Abs: 6.6 10*3/uL (ref 1.7–7.7)
Neutrophils Relative %: 77 %
Platelet Count: 210 10*3/uL (ref 150–400)
RBC: 4.17 MIL/uL — ABNORMAL LOW (ref 4.22–5.81)
RDW: 13 % (ref 11.5–15.5)
WBC Count: 8.4 10*3/uL (ref 4.0–10.5)
nRBC: 0 % (ref 0.0–0.2)

## 2023-03-02 LAB — CMP (CANCER CENTER ONLY)
ALT: 5 U/L (ref 0–44)
AST: 14 U/L — ABNORMAL LOW (ref 15–41)
Albumin: 4 g/dL (ref 3.5–5.0)
Alkaline Phosphatase: 84 U/L (ref 38–126)
Anion gap: 6 (ref 5–15)
BUN: 18 mg/dL (ref 8–23)
CO2: 29 mmol/L (ref 22–32)
Calcium: 10.2 mg/dL (ref 8.9–10.3)
Chloride: 102 mmol/L (ref 98–111)
Creatinine: 1.38 mg/dL — ABNORMAL HIGH (ref 0.61–1.24)
GFR, Estimated: 52 mL/min — ABNORMAL LOW (ref >60)
Glucose, Bld: 107 mg/dL — ABNORMAL HIGH (ref 70–99)
Potassium: 4.5 mmol/L (ref 3.5–5.1)
Sodium: 137 mmol/L (ref 135–145)
Total Bilirubin: 0.4 mg/dL (ref <1.2)
Total Protein: 7.6 g/dL (ref 6.5–8.1)

## 2023-03-02 MED ORDER — IOHEXOL 300 MG/ML  SOLN
75.0000 mL | Freq: Once | INTRAMUSCULAR | Status: AC | PRN
  Administered 2023-03-02: 75 mL via INTRAVENOUS

## 2023-03-13 NOTE — Progress Notes (Unsigned)
[facility] OFFICE PROGRESS NOTE  [name], PA-C [address] [place] [address]  DIAGNOSIS: Stage IV (T3, N1, M1b) non-small cell lung cancer favoring squamous cell carcinoma presented with superior segment left lower lobe lung mass with invasion of the chest wall and destruction of the posterior left sixth and seventh ribs as well as left hilar adenopathy and suspicious pleural metastasis diagnosed in March 2023   PRIOR THERAPY: 1) Palliative radiotherapy to the left lower lobe lung mass with chest wall invasion under the care of Dr. Roselind Messier. 2) Palliative systemic chemotherapy with carboplatin for AUC of 5, paclitaxel 175 Mg/M2 and Libtayo (Cempilimab) 350 Mg IV every 3 weeks with Neulasta support.  First dose was given on July 18, 2021.  Status post 4 cycles. 3) Maintenance treatment with Libtayo (Cempilimab) 350 Mg IV every 3 weeks, first dose October 10, 2021 status post 3 cycles.  Discontinued secondary to intolerance with immunotherapy mediated colitis and persistent diarrhea. 4) status post palliative radiotherapy to the progressive disease in the right adrenal gland as well as the left chest under the care of Dr. Roselind Messier completed October 24, 2022  CURRENT THERAPY: Observation   INTERVAL HISTORY: Edward Beck 77 y.o. male returns to the clinic today for follow-up visit.  The patient was last seen in the clinic by Dr. Arbutus Ped on 01/08/2023.  At that point in time, he had a restaging CT scan that showed possible 1.4 cm right infrahilar lymph node concerning for possible disease recurrence.  Dr. Arbutus Ped recommended short interval follow-up CT scan in 2 months.   ince last being seen, he denies any major changes in his health. He denies fevers, chills, night sweats, or weight loss. Denies chest pain, shortness of breath, cough or hemoptysis. Denies nausea, vomiting, or constipation.  He reports he has a soft bowel movement once a day in the morning.  He sometimes has  a left chest wall discomfort and abdominal discomfort that resolves with Tylenol.  Denies headaches or vision changes.  He has a Port-A-Cath which has ***flush?  Patient still he had a restaging CT scan.  He is here today for evaluation to review his scan results and discuss the next steps.     MEDICAL HISTORY: Past Medical History:  Diagnosis Date   Condyloma acuminatum of scrotum    COVID 2021   December 2022 - mild   History of radiation therapy    Left Lung- 07/23/21-08/02/21- Dr. Antony Blackbird   History of radiation therapy    Left chest, right Kidney- 10/14/22-10/24/22- Dr. Antony Blackbird   Lung cancer [facility])    Pneumonia    Scrotal lesion     ALLERGIES:  is allergic to tamiflu [oseltamivir phosphate].  MEDICATIONS:  Current Outpatient Medications  Medication Sig Dispense Refill   omeprazole (PRILOSEC) 20 MG capsule Take 1 capsule (20 mg total) by mouth daily. (Patient not taking: Reported on 09/24/2022) 30 capsule 2   Cyanocobalamin (B-12) 1000 MCG TABS Take 1 tablet by mouth daily.     diphenoxylate-atropine (LOMOTIL) 2.5-0.025 MG tablet TAKE 1 TABLET BY MOUTH FOUR TIMES DAILY AS NEEDED FOR DIARRHEA OR LOOSE STOOLS 30 tablet 1   finasteride (PROSCAR) 5 MG tablet Take 1 tablet (5 mg total) by mouth daily. 90 tablet 3   gabapentin (NEURONTIN) 100 MG capsule Take 1 capsule (100 mg total) by mouth 3 (three) times daily. (Patient not taking: Reported on 09/24/2022) 90 capsule 2   lidocaine-prilocaine (EMLA) cream Apply to the  Port-A-Cath site 30 minutes before chemotherapy. (Patient not taking: Reported on 11/27/2022) 30 g 0   mirtazapine (REMERON) 15 MG tablet Take 1 tablet (15 mg total) by mouth at bedtime. (Patient not taking: Reported on 09/24/2022) 30 tablet 2   potassium chloride SA (KLOR-CON M) 20 MEQ tablet Take 1 tablet (20 mEq total) by mouth 2 (two) times daily. (Patient not taking: Reported on 09/24/2022) 14 tablet 0   predniSONE (DELTASONE) 10 MG tablet 6 tablet p.o. daily  for 7 days followed by 5 tablet p.o. daily for 7 days followed by 4 tablet p.o. daily for 7 days followed by 2 tablets p.o. daily for 7 days followed by 1 tablet p.o. daily for 7 days. (Patient not taking: Reported on 11/27/2022) 150 tablet 0   prochlorperazine (COMPAZINE) 10 MG tablet Take 1 tablet (10 mg total) by mouth every 6 (six) hours as needed for nausea or vomiting. (Patient not taking: Reported on 09/24/2022) 30 tablet 0   triamcinolone cream (KENALOG) 0.1 % Apply 1 application  topically 2 (two) times daily as needed (dry skin). (Patient not taking: Reported on 09/24/2022)     umeclidinium-vilanterol (ANORO ELLIPTA) 62.5-25 MCG/ACT AEPB Inhale 1 puff into the lungs daily. (Patient not taking: Reported on 09/24/2022) 7 each 0   No current facility-administered medications for this visit.   Facility-Administered Medications Ordered in Other Visits  Medication Dose Route Frequency Provider Last Rate Last Admin   heparin lock flush 100 unit/mL  500 Units Intravenous Once Jovana Rembold L, PA-C       sodium chloride flush (NS) 0.9 % injection 10 mL  10 mL Intravenous PRN Maziyah Vessel L, PA-C        SURGICAL HISTORY:  Past Surgical History:  Procedure Laterality Date   BRONCHIAL BIOPSY  07/02/2021   Procedure: BRONCHIAL BIOPSIES;  Surgeon: Josephine Igo, DO;  Location: [facility];  Service: Pulmonary;;   BRONCHIAL BRUSHINGS  07/02/2021   Procedure: BRONCHIAL BRUSHINGS;  Surgeon: Josephine Igo, DO;  Location: [facility];  Service: Pulmonary;;   BRONCHIAL NEEDLE ASPIRATION BIOPSY  07/02/2021   Procedure: BRONCHIAL NEEDLE ASPIRATION BIOPSIES;  Surgeon: Josephine Igo, DO;  Location: [facility];  Service: Pulmonary;;   HERNIA REPAIR  2009   left inguinal   IR IMAGING GUIDED PORT INSERTION  07/16/2021   SCROTAL EXPLORATION  11/21/2011   Procedure: SCROTUM EXPLORATION;  Surgeon: Lindaann Slough, MD;  Location: [facility];  Service: Urology;  Laterality: N/A;  EXCISION,  BIOPSY SCROTAL CONDYLOMA    VIDEO BRONCHOSCOPY WITH ENDOBRONCHIAL ULTRASOUND Bilateral 07/02/2021   Procedure: VIDEO BRONCHOSCOPY WITH ENDOBRONCHIAL ULTRASOUND;  Surgeon: Josephine Igo, DO;  Location: [facility];  Service: Pulmonary;  Laterality: Bilateral;    REVIEW OF SYSTEMS:   Review of Systems  Constitutional: Negative for appetite change, chills, fatigue, fever and unexpected weight change.  HENT:   Negative for mouth sores, nosebleeds, sore throat and trouble swallowing.   Eyes: Negative for eye problems and icterus.  Respiratory: Negative for cough, hemoptysis, shortness of breath and wheezing.   Cardiovascular: Negative for chest pain and leg swelling.  Gastrointestinal: Negative for abdominal pain, constipation, diarrhea, nausea and vomiting.  Genitourinary: Negative for bladder incontinence, difficulty urinating, dysuria, frequency and hematuria.   Musculoskeletal: Negative for back pain, gait problem, neck pain and neck stiffness.  Skin: Negative for itching and rash.  Neurological: Negative for dizziness, extremity weakness, gait problem, headaches, light-headedness and seizures.  Hematological: Negative for adenopathy. Does not bruise/bleed easily.  Psychiatric/Behavioral: Negative  for confusion, depression and sleep disturbance. The patient is not nervous/anxious.     PHYSICAL EXAMINATION:  There were no vitals taken for this visit.  ECOG PERFORMANCE STATUS: {CHL ONC ECOG [id number]  Physical Exam  Constitutional: Oriented to person, place, and time and well-developed, well-nourished, and in no distress. No distress.  HENT:  Head: Normocephalic and atraumatic.  Mouth/Throat: Oropharynx is clear and moist. No oropharyngeal exudate.  Eyes: Conjunctivae are normal. Right eye exhibits no discharge. Left eye exhibits no discharge. No scleral icterus.  Neck: Normal range of motion. Neck supple.  Cardiovascular: Normal rate, regular rhythm, normal heart sounds and  intact distal pulses.   Pulmonary/Chest: Effort normal and breath sounds normal. No respiratory distress. No wheezes. No rales.  Abdominal: Soft. Bowel sounds are normal. Exhibits no distension and no mass. There is no tenderness.  Musculoskeletal: Normal range of motion. Exhibits no edema.  Lymphadenopathy:    No cervical adenopathy.  Neurological: Alert and oriented to person, place, and time. Exhibits normal muscle tone. Gait normal. Coordination normal.  Skin: Skin is warm and dry. No rash noted. Not diaphoretic. No erythema. No pallor.  Psychiatric: Mood, memory and judgment normal.  Vitals reviewed.  LABORATORY DATA: Lab Results  Component Value Date   WBC 8.4 03/02/2023   HGB 12.8 (L) 03/02/2023   HCT 38.8 (L) 03/02/2023   MCV 93.0 03/02/2023   PLT 210 03/02/2023      Chemistry      Component Value Date/Time   NA 137 03/02/2023 0918   K 4.5 03/02/2023 0918   CL 102 03/02/2023 0918   CO2 29 03/02/2023 0918   BUN 18 03/02/2023 0918   CREATININE 1.38 (H) 03/02/2023 0918      Component Value Date/Time   CALCIUM 10.2 03/02/2023 0918   ALKPHOS 84 03/02/2023 0918   AST 14 (L) 03/02/2023 0918   ALT 5 03/02/2023 0918   BILITOT 0.4 03/02/2023 0918       RADIOGRAPHIC STUDIES:  No results found.   ASSESSMENT/PLAN:  This is a very pleasant 77 year old Caucasian male with stage IV (T3, N1, M1 B) non-small cell lung cancer, favoring squamous cell carcinoma.  The patient presented with a superior segment left upper lobe lung mass with invasion of the chest and destruction of the posterior left sixth and seventh ribs as well as left hilar adenopathy.  The patient has suspicious pleural metastases.  The patient was diagnosed in March 2023.    The patient underwent palliative radiation to left lower lobe lung mass and chest wall lesion under the care of Dr. Roselind Messier.  He then underwent systemic chemotherapy with carboplatin for an AUC 5, paclitaxel 175 mg per metered squared,  Libtayo 350 mg IV every 3 weeks.  He completed 4 cycles this was started on 07/18/2021.   He then started maintenance treatment with single agent Libtayo 350 mg IV every 3 weeks starting 10/10/2021.  Cycle 3 was delayed due to significant fatigue and weakness. This was discontinued due to suspicious colitis and diarrhea.   He is also status post palliative radiotherapy to the progressive disease in the right adrenal gland as well as the left chest under the care of Dr. Roselind Messier completed October 24, 2022.   At his last appointment his scan showed new 1.4 cm right infrahilar lymph node concerning for possible disease recurrence.  Dr. Arbutus Ped recommended short interval follow-up.  The patient was seen with Dr. Arbutus Ped today.  Dr. Arbutus Ped personally and independently reviewed the scan and discussed  results with the patient today.  The scan showed ***.  Dr. Arbutus Ped recommends ***  I will arrange for ***  We will see her for follow up visit in *** with ***.   The patient was advised to call immediately if she has any concerning symptoms in the interval. The patient voices understanding of current disease status and treatment options and is in agreement with the current care plan. All questions were answered. The patient knows to call the clinic with any problems, questions or concerns. We can certainly see the patient much sooner if necessary       No orders of the defined types were placed in this encounter.    I spent {CHL ONC TIME VISIT - [id number]} counseling the patient face to face. The total time spent in the appointment was {CHL ONC TIME VISIT - OZHYQ:[id number]}.  Jamicah Anstead L Lakoda Raske, PA-C 03/13/23

## 2023-03-16 ENCOUNTER — Inpatient Hospital Stay: Payer: Medicare Other | Attending: Internal Medicine | Admitting: Physician Assistant

## 2023-03-16 ENCOUNTER — Encounter: Payer: Self-pay | Admitting: Internal Medicine

## 2023-03-16 VITALS — BP 120/70 | HR 85 | Temp 97.0°F | Resp 16 | Wt 139.8 lb

## 2023-03-16 DIAGNOSIS — Z7189 Other specified counseling: Secondary | ICD-10-CM | POA: Insufficient documentation

## 2023-03-16 DIAGNOSIS — C7951 Secondary malignant neoplasm of bone: Secondary | ICD-10-CM | POA: Insufficient documentation

## 2023-03-16 DIAGNOSIS — C3432 Malignant neoplasm of lower lobe, left bronchus or lung: Secondary | ICD-10-CM | POA: Insufficient documentation

## 2023-03-16 DIAGNOSIS — Z5111 Encounter for antineoplastic chemotherapy: Secondary | ICD-10-CM | POA: Diagnosis not present

## 2023-03-16 DIAGNOSIS — C3492 Malignant neoplasm of unspecified part of left bronchus or lung: Secondary | ICD-10-CM

## 2023-03-16 MED ORDER — PROCHLORPERAZINE MALEATE 10 MG PO TABS: 10.0000 mg | ORAL_TABLET | Freq: Four times a day (QID) | ORAL | 2 refills | Status: DC | PRN

## 2023-03-16 NOTE — Progress Notes (Signed)
DISCONTINUE OFF PATHWAY REGIMEN - Non-Small Cell Lung   [id number]:Cemiplimab 350 mg IV D1 + Carboplatin AUC=6 IV D1 + Paclitaxel 200 mg/m2 IV D1 q21 Days x 4 Cycles:   A cycle is every 21 days:     Paclitaxel      Carboplatin      Cemiplimab-rwlc   **Always confirm dose/schedule in your pharmacy ordering system**  PRIOR TREATMENT: Off Pathway: Cemiplimab 350 mg IV D1 + Carboplatin AUC=6 IV D1 + Paclitaxel 200 mg/m2 IV D1 q21 Days x 4 Cycles  START ON PATHWAY REGIMEN - Non-Small Cell Lung     A cycle is every 21 days:     Paclitaxel      Carboplatin   **Always confirm dose/schedule in your pharmacy ordering system**  Patient Characteristics: Stage IV Metastatic, Squamous, Molecular Analysis Not Elected, PS = 0, 1, Initial Chemotherapy/Immunotherapy, Not a Candidate for Immunotherapy Therapeutic Status: Stage IV Metastatic Histology: Squamous Cell ECOG Performance Status: 1 Chemotherapy/Immunotherapy Line of Therapy: Initial Chemotherapy/Immunotherapy Immunotherapy Candidate Status: Not a Candidate for Immunotherapy Intent of Therapy: Non-Curative / Palliative Intent, Discussed with Patient

## 2023-03-17 ENCOUNTER — Other Ambulatory Visit: Payer: Self-pay

## 2023-03-26 DIAGNOSIS — F17210 Nicotine dependence, cigarettes, uncomplicated: Secondary | ICD-10-CM | POA: Diagnosis not present

## 2023-03-26 DIAGNOSIS — J019 Acute sinusitis, unspecified: Secondary | ICD-10-CM | POA: Diagnosis not present

## 2023-03-26 DIAGNOSIS — Z6826 Body mass index (BMI) 26.0-26.9, adult: Secondary | ICD-10-CM | POA: Diagnosis not present

## 2023-03-26 DIAGNOSIS — R030 Elevated blood-pressure reading, without diagnosis of hypertension: Secondary | ICD-10-CM | POA: Diagnosis not present

## 2023-03-27 NOTE — Progress Notes (Signed)

## 2023-04-03 MED FILL — Fosaprepitant Dimeglumine For IV Infusion 150 MG (Base Eq): INTRAVENOUS | Qty: 5 | Status: AC

## 2023-04-06 ENCOUNTER — Inpatient Hospital Stay: Payer: Medicare Other

## 2023-04-06 ENCOUNTER — Inpatient Hospital Stay (HOSPITAL_BASED_OUTPATIENT_CLINIC_OR_DEPARTMENT_OTHER): Payer: Medicare Other | Admitting: Internal Medicine

## 2023-04-06 VITALS — BP 116/64 | HR 82 | Temp 97.9°F | Resp 14 | Ht 73.5 in | Wt 138.0 lb

## 2023-04-06 VITALS — BP 120/68 | HR 78 | Resp 16

## 2023-04-06 DIAGNOSIS — C3492 Malignant neoplasm of unspecified part of left bronchus or lung: Secondary | ICD-10-CM

## 2023-04-06 DIAGNOSIS — C3432 Malignant neoplasm of lower lobe, left bronchus or lung: Secondary | ICD-10-CM | POA: Diagnosis not present

## 2023-04-06 DIAGNOSIS — C7951 Secondary malignant neoplasm of bone: Secondary | ICD-10-CM | POA: Diagnosis not present

## 2023-04-06 DIAGNOSIS — Z5111 Encounter for antineoplastic chemotherapy: Secondary | ICD-10-CM | POA: Diagnosis not present

## 2023-04-06 LAB — CMP (CANCER CENTER ONLY)
ALT: 5 U/L (ref 0–44)
AST: 11 U/L — ABNORMAL LOW (ref 15–41)
Albumin: 3.9 g/dL (ref 3.5–5.0)
Alkaline Phosphatase: 73 U/L (ref 38–126)
Anion gap: 7 (ref 5–15)
BUN: 17 mg/dL (ref 8–23)
CO2: 25 mmol/L (ref 22–32)
Calcium: 10.3 mg/dL (ref 8.9–10.3)
Chloride: 105 mmol/L (ref 98–111)
Creatinine: 1.27 mg/dL — ABNORMAL HIGH (ref 0.61–1.24)
GFR, Estimated: 57 mL/min — ABNORMAL LOW (ref >60)
Glucose, Bld: 118 mg/dL — ABNORMAL HIGH (ref 70–99)
Potassium: 3.9 mmol/L (ref 3.5–5.1)
Sodium: 137 mmol/L (ref 135–145)
Total Bilirubin: 0.4 mg/dL (ref <1.2)
Total Protein: 7.4 g/dL (ref 6.5–8.1)

## 2023-04-06 LAB — CBC WITH DIFFERENTIAL (CANCER CENTER ONLY)
Abs Immature Granulocytes: 0.08 10*3/uL — ABNORMAL HIGH (ref 0.00–0.07)
Basophils Absolute: 0.1 10*3/uL (ref 0.0–0.1)
Basophils Relative: 2 %
Eosinophils Absolute: 0.4 10*3/uL (ref 0.0–0.5)
Eosinophils Relative: 5 %
HCT: 35.6 % — ABNORMAL LOW (ref 39.0–52.0)
Hemoglobin: 11.5 g/dL — ABNORMAL LOW (ref 13.0–17.0)
Immature Granulocytes: 1 %
Lymphocytes Relative: 7 %
Lymphs Abs: 0.6 10*3/uL — ABNORMAL LOW (ref 0.7–4.0)
MCH: 29 pg (ref 26.0–34.0)
MCHC: 32.3 g/dL (ref 30.0–36.0)
MCV: 89.9 fL (ref 80.0–100.0)
Monocytes Absolute: 0.7 10*3/uL (ref 0.1–1.0)
Monocytes Relative: 8 %
Neutro Abs: 6.7 10*3/uL (ref 1.7–7.7)
Neutrophils Relative %: 77 %
Platelet Count: 233 10*3/uL (ref 150–400)
RBC: 3.96 MIL/uL — ABNORMAL LOW (ref 4.22–5.81)
RDW: 13.1 % (ref 11.5–15.5)
WBC Count: 8.6 10*3/uL (ref 4.0–10.5)
nRBC: 0 % (ref 0.0–0.2)

## 2023-04-06 MED ORDER — SODIUM CHLORIDE 0.9 % IV SOLN
319.5000 mg | Freq: Once | INTRAVENOUS | Status: AC
  Administered 2023-04-06: 320 mg via INTRAVENOUS
  Filled 2023-04-06: qty 32

## 2023-04-06 MED ORDER — SODIUM CHLORIDE 0.9 % IV SOLN
175.0000 mg/m2 | Freq: Once | INTRAVENOUS | Status: AC
  Administered 2023-04-06: 318 mg via INTRAVENOUS
  Filled 2023-04-06: qty 53

## 2023-04-06 MED ORDER — DIPHENHYDRAMINE HCL 50 MG/ML IJ SOLN
50.0000 mg | Freq: Once | INTRAMUSCULAR | Status: AC
  Administered 2023-04-06: 50 mg via INTRAVENOUS
  Filled 2023-04-06: qty 1

## 2023-04-06 MED ORDER — SODIUM CHLORIDE 0.9 % IV SOLN: INTRAVENOUS | Status: DC

## 2023-04-06 MED ORDER — PALONOSETRON HCL INJECTION 0.25 MG/5ML
0.2500 mg | Freq: Once | INTRAVENOUS | Status: AC
  Administered 2023-04-06: 0.25 mg via INTRAVENOUS
  Filled 2023-04-06: qty 5

## 2023-04-06 MED ORDER — FAMOTIDINE IN NACL 20-0.9 MG/50ML-% IV SOLN
20.0000 mg | Freq: Once | INTRAVENOUS | Status: AC
  Administered 2023-04-06: 20 mg via INTRAVENOUS
  Filled 2023-04-06: qty 50

## 2023-04-06 MED ORDER — SODIUM CHLORIDE 0.9% FLUSH
10.0000 mL | INTRAVENOUS | Status: DC | PRN
Start: 2023-04-06 — End: 2023-04-06
  Administered 2023-04-06: 10 mL

## 2023-04-06 MED ORDER — SODIUM CHLORIDE 0.9 % IV SOLN
150.0000 mg | Freq: Once | INTRAVENOUS | Status: AC
  Administered 2023-04-06: 150 mg via INTRAVENOUS
  Filled 2023-04-06: qty 150

## 2023-04-06 MED ORDER — HEPARIN SOD (PORK) LOCK FLUSH 100 UNIT/ML IV SOLN
500.0000 [IU] | Freq: Once | INTRAVENOUS | Status: AC | PRN
  Administered 2023-04-06: 500 [IU]

## 2023-04-06 MED ORDER — DEXAMETHASONE SODIUM PHOSPHATE 10 MG/ML IJ SOLN
10.0000 mg | Freq: Once | INTRAMUSCULAR | Status: AC
  Administered 2023-04-06: 10 mg via INTRAVENOUS
  Filled 2023-04-06: qty 1

## 2023-04-06 NOTE — Progress Notes (Signed)
[facility] [phone]   [phone]  OFFICE PROGRESS NOTE  [name], PA-C [address] [place] [address]  DIAGNOSIS: Stage IV (T3, N1, M1b) non-small cell lung cancer favoring squamous cell carcinoma presented with superior segment left lower lobe lung mass with invasion of the chest wall and destruction of the posterior left sixth and seventh ribs as well as left hilar adenopathy and suspicious pleural metastasis diagnosed in March 2023  PRIOR THERAPY:  1) Palliative radiotherapy to the left lower lobe lung mass with chest wall invasion under the care of Dr. Roselind Messier. 2) Palliative systemic chemotherapy with carboplatin for AUC of 5, paclitaxel 175 Mg/M2 and Libtayo (Cempilimab) 350 Mg IV every 3 weeks with Neulasta support.  First dose was given on July 18, 2021.  Status post 4 cycles. 3) Maintenance treatment with Libtayo (Cempilimab) 350 Mg IV every 3 weeks, first dose October 10, 2021 status post 3 cycles.  Discontinued secondary to intolerance with immunotherapy mediated colitis and persistent diarrhea. 4) status post palliative radiotherapy to the progressive disease in the right adrenal gland as well as the left chest under the care of Dr. Roselind Messier completed October 24, 2022  CURRENT THERAPY: Systemic chemotherapy with carboplatin for AUC of 5 and paclitaxel 175 Mg/M2 every 3 weeks with Neulasta support first dose April 06, 2023.   INTERVAL HISTORY:  Edward Beck 77 y.o. male returns to the clinic today for follow-up visit accompanied by his wife.Discussed the use of AI scribe software for clinical note transcription with the patient, who gave verbal consent to proceed.  History of Present Illness   Edward Beck, a 77 year old patient with a history of non-small cell lung cancer, underwent a cycle of chemotherapy with carboplatin, paclitaxel, and Libtayo in March 2023. Since then, he has been experiencing a persistent cough, which  he manages by blowing his nose to clear mucus. He reports that he has been able to cough up phlegm, but not consistently. He noted a single episode of hemoptysis about a week ago, but no recurrence since then.  In the past week, his cough has improved. He denies any associated symptoms such as nausea, vomiting, diarrhea, or recent weight loss. However, he does report experiencing night sweats, which he attributes to his cancer treatment.  He also mentions that his coughing episodes are more frequent when he lies down. Despite these symptoms, he reports feeling generally well and has been able to travel to [place] for the holidays. He is scheduled for another round of chemotherapy in three weeks.       MEDICAL HISTORY: Past Medical History:  Diagnosis Date   Condyloma acuminatum of scrotum    COVID 2021   December 2022 - mild   History of radiation therapy    Left Lung- 07/23/21-08/02/21- Dr. Antony Blackbird   History of radiation therapy    Left chest, right Kidney- 10/14/22-10/24/22- Dr. Antony Blackbird   Lung cancer [facility])    Pneumonia    Scrotal lesion     ALLERGIES:  is allergic to tamiflu [oseltamivir phosphate].  MEDICATIONS:  Current Outpatient Medications  Medication Sig Dispense Refill   Cyanocobalamin (B-12) 1000 MCG TABS Take 1 tablet by mouth daily.     diphenoxylate-atropine (LOMOTIL) 2.5-0.025 MG tablet TAKE 1 TABLET BY MOUTH FOUR TIMES DAILY AS NEEDED FOR DIARRHEA OR LOOSE STOOLS 30 tablet 1   finasteride (PROSCAR) 5 MG tablet Take 1 tablet (5 mg total) by mouth daily.  90 tablet 3   gabapentin (NEURONTIN) 100 MG capsule Take 1 capsule (100 mg total) by mouth 3 (three) times daily. 90 capsule 2   lidocaine-prilocaine (EMLA) cream Apply to the Port-A-Cath site 30 minutes before chemotherapy. 30 g 0   mirtazapine (REMERON) 15 MG tablet Take 1 tablet (15 mg total) by mouth at bedtime. 30 tablet 2   omeprazole (PRILOSEC) 20 MG capsule Take 1 capsule (20 mg total) by mouth  daily. 30 capsule 2   potassium chloride SA (KLOR-CON M) 20 MEQ tablet Take 1 tablet (20 mEq total) by mouth 2 (two) times daily. 14 tablet 0   predniSONE (DELTASONE) 10 MG tablet 6 tablet p.o. daily for 7 days followed by 5 tablet p.o. daily for 7 days followed by 4 tablet p.o. daily for 7 days followed by 2 tablets p.o. daily for 7 days followed by 1 tablet p.o. daily for 7 days. 150 tablet 0   prochlorperazine (COMPAZINE) 10 MG tablet Take 1 tablet (10 mg total) by mouth every 6 (six) hours as needed for nausea or vomiting. 30 tablet 0   prochlorperazine (COMPAZINE) 10 MG tablet Take 1 tablet (10 mg total) by mouth every 6 (six) hours as needed. 30 tablet 2   triamcinolone cream (KENALOG) 0.1 % Apply 1 application  topically 2 (two) times daily as needed (dry skin).     umeclidinium-vilanterol (ANORO ELLIPTA) 62.5-25 MCG/ACT AEPB Inhale 1 puff into the lungs daily. 7 each 0   No current facility-administered medications for this visit.   Facility-Administered Medications Ordered in Other Visits  Medication Dose Route Frequency Provider Last Rate Last Admin   heparin lock flush 100 unit/mL  500 Units Intravenous Once Heilingoetter, Cassandra L, PA-C       sodium chloride flush (NS) 0.9 % injection 10 mL  10 mL Intravenous PRN Heilingoetter, Cassandra L, PA-C        SURGICAL HISTORY:  Past Surgical History:  Procedure Laterality Date   BRONCHIAL BIOPSY  07/02/2021   Procedure: BRONCHIAL BIOPSIES;  Surgeon: Josephine Igo, DO;  Location: [facility];  Service: Pulmonary;;   BRONCHIAL BRUSHINGS  07/02/2021   Procedure: BRONCHIAL BRUSHINGS;  Surgeon: Josephine Igo, DO;  Location: [facility];  Service: Pulmonary;;   BRONCHIAL NEEDLE ASPIRATION BIOPSY  07/02/2021   Procedure: BRONCHIAL NEEDLE ASPIRATION BIOPSIES;  Surgeon: Josephine Igo, DO;  Location: [facility];  Service: Pulmonary;;   HERNIA REPAIR  2009   left inguinal   IR IMAGING GUIDED PORT INSERTION  07/16/2021   SCROTAL  EXPLORATION  11/21/2011   Procedure: SCROTUM EXPLORATION;  Surgeon: Lindaann Slough, MD;  Location: [facility];  Service: Urology;  Laterality: N/A;  EXCISION, BIOPSY SCROTAL CONDYLOMA    VIDEO BRONCHOSCOPY WITH ENDOBRONCHIAL ULTRASOUND Bilateral 07/02/2021   Procedure: VIDEO BRONCHOSCOPY WITH ENDOBRONCHIAL ULTRASOUND;  Surgeon: Josephine Igo, DO;  Location: [facility];  Service: Pulmonary;  Laterality: Bilateral;    REVIEW OF SYSTEMS:  A comprehensive review of systems was negative except for: Constitutional: positive for fatigue Respiratory: positive for cough   PHYSICAL EXAMINATION: General appearance: alert, cooperative, fatigued, and no distress Head: Normocephalic, without obvious abnormality, atraumatic Neck: no adenopathy, no JVD, supple, symmetrical, trachea midline, and thyroid not enlarged, symmetric, no tenderness/mass/nodules Lymph nodes: Cervical, supraclavicular, and axillary nodes normal. Resp: clear to auscultation bilaterally Back: symmetric, no curvature. ROM normal. No CVA tenderness. Cardio: regular rate and rhythm, S1, S2 normal, no murmur, click, rub or gallop GI: soft, non-tender; bowel sounds normal; no masses,  no organomegaly Extremities: extremities normal, atraumatic, no cyanosis or edema  ECOG PERFORMANCE STATUS: 1 - Symptomatic but completely ambulatory  Blood pressure 116/64, pulse 82, temperature 97.9 F (36.6 C), resp. rate 14, height 6' 1.5" (1.867 m), weight 138 lb (62.6 kg), SpO2 100%.  LABORATORY DATA: Lab Results  Component Value Date   WBC 8.6 04/06/2023   HGB 11.5 (L) 04/06/2023   HCT 35.6 (L) 04/06/2023   MCV 89.9 04/06/2023   PLT 233 04/06/2023      Chemistry      Component Value Date/Time   NA 137 04/06/2023 0843   K 3.9 04/06/2023 0843   CL 105 04/06/2023 0843   CO2 25 04/06/2023 0843   BUN 17 04/06/2023 0843   CREATININE 1.27 (H) 04/06/2023 0843      Component Value Date/Time   CALCIUM 10.3 04/06/2023 0843   ALKPHOS 73  04/06/2023 0843   AST 11 (L) 04/06/2023 0843   ALT 5 04/06/2023 0843   BILITOT 0.4 04/06/2023 0843       RADIOGRAPHIC STUDIES: No results found.  ASSESSMENT AND PLAN: This is a very pleasant 77 years old white male with Stage IV (T3, N1, M1b) non-small cell lung cancer favoring squamous cell carcinoma presented with superior segment left lower lobe lung mass with invasion of the chest wall and destruction of the posterior left sixth and seventh ribs as well as left hilar adenopathy and suspicious pleural metastasis diagnosed in March 2023 He is currently undergoing palliative radiotherapy to the left lower lobe lung mass with chest wall invasion under the care of Dr. Roselind Messier. He is also undergoing systemic chemotherapy with carboplatin for AUC of 5, paclitaxel 175 Mg/M2 and Libtayo (Cempilimab) 350 Mg IV every 3 weeks.  He started the first dose of his treatment on July 18, 2021.  Status post 4 cycles.   The patient started maintenance treatment with single agent Libtayo (Cempilimab) 350 Mg IV every 3 weeks on October 10, 2021 status post 3 cycles.  His treatment was discontinued secondary to immunotherapy mediated colitis and significant diarrhea which was treated with a tapered dose of prednisone and improved. He is also status post palliative radiotherapy to the progressive disease in the right adrenal gland as well as the left chest under the care of Dr. Roselind Messier completed October 24, 2022.  He had repeat CT scan of the chest, abdomen and pelvis performed recently.  I personally and independently reviewed the scan images and discussed the result and showed the images to the patient and his wife. His scan showed stable disease with stable extensive posttreatment changes involving the left upper lobe with radiation fibrosis and bronchiectasis but there was new 1.4 cm right infrahilar lymph node concerning for possible disease recurrence. The patient is currently on systemic chemotherapy with carboplatin  for AUC of 5 and paclitaxel 175 Mg/M2 with Neulasta support every 3 weeks.  First dose April 06, 2023.    Non-Small Cell Lung Cancer (NSCLC) Stage IV Stage IV NSCLC diagnosed in March 2023. Currently on carboplatin and paclitaxel. Reports feeling well with no significant complaints except for a cough with occasional mucus. No recent weight loss, nausea, vomiting, or diarrhea. Blood work is satisfactory for treatment continuation. Discussed the importance of ongoing chemotherapy to manage disease and improve quality of life. Explained potential side effects including nausea, fatigue, and infection risk. Emphasized regular monitoring through weekly lab tests. - Proceed with chemotherapy - Schedule weekly lab tests - Administer G-CSF injection on Thursday - Follow-up in  three weeks for the second round of chemotherapy  Cough Intermittent cough with mucus production, occasionally severe. No recent hemoptysis, fever, or night sweats. Discussed hydration and humidifier use to alleviate symptoms. - Monitor symptoms and report any changes - Encourage hydration and humidifier use  General Health Maintenance Emphasized the importance of documenting all appointments and treatments. - Ensure all appointments and treatments are documented - Verify chemotherapy schedule and injection administration  Follow-up - Follow-up in three weeks for the second round of chemotherapy - Ensure weekly lab tests are scheduled and completed.   The patient was advised to call immediately if he has any other concerning symptoms in the interval. The patient voices understanding of current disease status and treatment options and is in agreement with the current care plan.  All questions were answered. The patient knows to call the clinic with any problems, questions or concerns. We can certainly see the patient much sooner if necessary.  The total time spent in the appointment was 20 minutes.  Disclaimer: This note  was dictated with voice recognition software. Similar sounding words can inadvertently be transcribed and may not be corrected upon review.

## 2023-04-06 NOTE — Patient Instructions (Signed)
 [facility] - A DEPT OF [facility]  Discharge Instructions: Thank you for choosing [facility] to provide your oncology and hematology care.   If you have a lab appointment with the Cancer Center, please go directly to the Cancer Center and check in at the registration area.   Wear comfortable clothing and clothing appropriate for easy access to any Portacath or PICC line.   We strive to give you quality time with your provider. You may need to reschedule your appointment if you arrive late (15 or more minutes).  Arriving late affects you and other patients whose appointments are after yours.  Also, if you miss three or more appointments without notifying the office, you may be dismissed from the clinic at the provider's discretion.      For prescription refill requests, have your pharmacy contact our office and allow 72 hours for refills to be completed.    Today you received the following chemotherapy and/or immunotherapy agents Taxol / Carboplatin      To help prevent nausea and vomiting after your treatment, we encourage you to take your nausea medication as directed.  BELOW ARE SYMPTOMS THAT SHOULD BE REPORTED IMMEDIATELY: *FEVER GREATER THAN 100.4 F (38 C) OR HIGHER *CHILLS OR SWEATING *NAUSEA AND VOMITING THAT IS NOT CONTROLLED WITH YOUR NAUSEA MEDICATION *UNUSUAL SHORTNESS OF BREATH *UNUSUAL BRUISING OR BLEEDING *URINARY PROBLEMS (pain or burning when urinating, or frequent urination) *BOWEL PROBLEMS (unusual diarrhea, constipation, pain near the anus) TENDERNESS IN MOUTH AND THROAT WITH OR WITHOUT PRESENCE OF ULCERS (sore throat, sores in mouth, or a toothache) UNUSUAL RASH, SWELLING OR PAIN  UNUSUAL VAGINAL DISCHARGE OR ITCHING   Items with * indicate a potential emergency and should be followed up as soon as possible or go to the Emergency Department if any problems should occur.  Please show the CHEMOTHERAPY ALERT CARD or  IMMUNOTHERAPY ALERT CARD at check-in to the Emergency Department and triage nurse.  Should you have questions after your visit or need to cancel or reschedule your appointment, please contact [facility] - A DEPT OF [facility]  Dept: [phone]  and follow the prompts.  Office hours are 8:00 a.m. to 4:30 p.m. Monday - Friday. Please note that voicemails left after 4:00 p.m. may not be returned until the following business day.  We are closed weekends and major holidays. You have access to a nurse at all times for urgent questions. Please call the main number to the clinic Dept: [phone] and follow the prompts.   For any non-urgent questions, you may also contact your provider using MyChart. We now offer e-Visits for anyone 33 and older to request care online for non-urgent symptoms. For details visit [email or web address].   Also download the MyChart app! Go to the app store, search "MyChart", open the app, select [place], and log in with your MyChart username and password.

## 2023-04-09 ENCOUNTER — Inpatient Hospital Stay: Payer: Medicare Other | Attending: Internal Medicine

## 2023-04-09 VITALS — BP 105/91 | HR 91 | Temp 98.0°F | Resp 16

## 2023-04-09 DIAGNOSIS — C7989 Secondary malignant neoplasm of other specified sites: Secondary | ICD-10-CM | POA: Diagnosis not present

## 2023-04-09 DIAGNOSIS — Z5111 Encounter for antineoplastic chemotherapy: Secondary | ICD-10-CM | POA: Insufficient documentation

## 2023-04-09 DIAGNOSIS — C7951 Secondary malignant neoplasm of bone: Secondary | ICD-10-CM | POA: Diagnosis not present

## 2023-04-09 DIAGNOSIS — C3432 Malignant neoplasm of lower lobe, left bronchus or lung: Secondary | ICD-10-CM | POA: Diagnosis not present

## 2023-04-09 DIAGNOSIS — Z5189 Encounter for other specified aftercare: Secondary | ICD-10-CM | POA: Insufficient documentation

## 2023-04-09 DIAGNOSIS — C3492 Malignant neoplasm of unspecified part of left bronchus or lung: Secondary | ICD-10-CM

## 2023-04-09 MED ORDER — PEGFILGRASTIM-FPGK 6 MG/0.6ML ~~LOC~~ SOSY
6.0000 mg | PREFILLED_SYRINGE | Freq: Once | SUBCUTANEOUS | Status: AC
Start: 2023-04-09 — End: 2023-04-09
  Administered 2023-04-09: 6 mg via SUBCUTANEOUS
  Filled 2023-04-09: qty 0.6

## 2023-04-10 ENCOUNTER — Other Ambulatory Visit: Payer: Self-pay

## 2023-04-10 DIAGNOSIS — C3492 Malignant neoplasm of unspecified part of left bronchus or lung: Secondary | ICD-10-CM

## 2023-04-13 ENCOUNTER — Inpatient Hospital Stay: Payer: Medicare Other

## 2023-04-13 VITALS — BP 112/68 | HR 85 | Temp 97.7°F | Resp 18

## 2023-04-13 DIAGNOSIS — Z5111 Encounter for antineoplastic chemotherapy: Secondary | ICD-10-CM | POA: Diagnosis not present

## 2023-04-13 DIAGNOSIS — C7951 Secondary malignant neoplasm of bone: Secondary | ICD-10-CM | POA: Diagnosis not present

## 2023-04-13 DIAGNOSIS — C7989 Secondary malignant neoplasm of other specified sites: Secondary | ICD-10-CM | POA: Diagnosis not present

## 2023-04-13 DIAGNOSIS — C3432 Malignant neoplasm of lower lobe, left bronchus or lung: Secondary | ICD-10-CM | POA: Diagnosis not present

## 2023-04-13 DIAGNOSIS — C3492 Malignant neoplasm of unspecified part of left bronchus or lung: Secondary | ICD-10-CM

## 2023-04-13 DIAGNOSIS — Z5189 Encounter for other specified aftercare: Secondary | ICD-10-CM | POA: Diagnosis not present

## 2023-04-13 DIAGNOSIS — Z95828 Presence of other vascular implants and grafts: Secondary | ICD-10-CM

## 2023-04-13 LAB — CBC WITH DIFFERENTIAL (CANCER CENTER ONLY)
Abs Immature Granulocytes: 0.72 10*3/uL — ABNORMAL HIGH (ref 0.00–0.07)
Basophils Absolute: 0.1 10*3/uL (ref 0.0–0.1)
Basophils Relative: 1 %
Eosinophils Absolute: 0.4 10*3/uL (ref 0.0–0.5)
Eosinophils Relative: 4 %
HCT: 32.2 % — ABNORMAL LOW (ref 39.0–52.0)
Hemoglobin: 10.4 g/dL — ABNORMAL LOW (ref 13.0–17.0)
Immature Granulocytes: 8 %
Lymphocytes Relative: 13 %
Lymphs Abs: 1.1 10*3/uL (ref 0.7–4.0)
MCH: 28.7 pg (ref 26.0–34.0)
MCHC: 32.3 g/dL (ref 30.0–36.0)
MCV: 88.7 fL (ref 80.0–100.0)
Monocytes Absolute: 1 10*3/uL (ref 0.1–1.0)
Monocytes Relative: 11 %
Neutro Abs: 5.5 10*3/uL (ref 1.7–7.7)
Neutrophils Relative %: 63 %
Platelet Count: 109 10*3/uL — ABNORMAL LOW (ref 150–400)
RBC: 3.63 MIL/uL — ABNORMAL LOW (ref 4.22–5.81)
RDW: 13.4 % (ref 11.5–15.5)
WBC Count: 8.8 10*3/uL (ref 4.0–10.5)
nRBC: 0 % (ref 0.0–0.2)

## 2023-04-13 LAB — CMP (CANCER CENTER ONLY)
ALT: 10 U/L (ref 0–44)
AST: 11 U/L — ABNORMAL LOW (ref 15–41)
Albumin: 3.5 g/dL (ref 3.5–5.0)
Alkaline Phosphatase: 95 U/L (ref 38–126)
Anion gap: 6 (ref 5–15)
BUN: 23 mg/dL (ref 8–23)
CO2: 25 mmol/L (ref 22–32)
Calcium: 9.4 mg/dL (ref 8.9–10.3)
Chloride: 105 mmol/L (ref 98–111)
Creatinine: 1.06 mg/dL (ref 0.61–1.24)
GFR, Estimated: >60 mL/min (ref >60)
Glucose, Bld: 104 mg/dL — ABNORMAL HIGH (ref 70–99)
Potassium: 3.5 mmol/L (ref 3.5–5.1)
Sodium: 136 mmol/L (ref 135–145)
Total Bilirubin: 0.3 mg/dL (ref 0.0–1.2)
Total Protein: 6.4 g/dL — ABNORMAL LOW (ref 6.5–8.1)

## 2023-04-13 MED ORDER — HEPARIN SOD (PORK) LOCK FLUSH 100 UNIT/ML IV SOLN
500.0000 [IU] | Freq: Once | INTRAVENOUS | Status: AC
  Administered 2023-04-13: 500 [IU]

## 2023-04-13 MED ORDER — SODIUM CHLORIDE 0.9% FLUSH
10.0000 mL | Freq: Once | INTRAVENOUS | Status: AC
  Administered 2023-04-13: 10 mL

## 2023-04-14 ENCOUNTER — Encounter: Payer: Self-pay | Admitting: Physician Assistant

## 2023-04-14 ENCOUNTER — Encounter: Payer: Self-pay | Admitting: Internal Medicine

## 2023-04-20 ENCOUNTER — Inpatient Hospital Stay: Payer: Medicare Other

## 2023-04-20 ENCOUNTER — Encounter: Payer: Self-pay | Admitting: Internal Medicine

## 2023-04-20 ENCOUNTER — Encounter: Payer: Self-pay | Admitting: Physician Assistant

## 2023-04-20 VITALS — BP 106/60 | HR 87 | Temp 97.7°F | Resp 18

## 2023-04-20 DIAGNOSIS — Z95828 Presence of other vascular implants and grafts: Secondary | ICD-10-CM

## 2023-04-20 DIAGNOSIS — Z5111 Encounter for antineoplastic chemotherapy: Secondary | ICD-10-CM | POA: Diagnosis not present

## 2023-04-20 DIAGNOSIS — C7951 Secondary malignant neoplasm of bone: Secondary | ICD-10-CM | POA: Diagnosis not present

## 2023-04-20 DIAGNOSIS — Z5189 Encounter for other specified aftercare: Secondary | ICD-10-CM | POA: Diagnosis not present

## 2023-04-20 DIAGNOSIS — C3432 Malignant neoplasm of lower lobe, left bronchus or lung: Secondary | ICD-10-CM | POA: Diagnosis not present

## 2023-04-20 DIAGNOSIS — C7989 Secondary malignant neoplasm of other specified sites: Secondary | ICD-10-CM | POA: Diagnosis not present

## 2023-04-20 LAB — CBC WITH DIFFERENTIAL (CANCER CENTER ONLY)
Abs Immature Granulocytes: 0.82 10*3/uL — ABNORMAL HIGH (ref 0.00–0.07)
Basophils Absolute: 0.2 10*3/uL — ABNORMAL HIGH (ref 0.0–0.1)
Basophils Relative: 1 %
Eosinophils Absolute: 0.2 10*3/uL (ref 0.0–0.5)
Eosinophils Relative: 1 %
HCT: 32.7 % — ABNORMAL LOW (ref 39.0–52.0)
Hemoglobin: 10.5 g/dL — ABNORMAL LOW (ref 13.0–17.0)
Immature Granulocytes: 4 %
Lymphocytes Relative: 5 %
Lymphs Abs: 1 10*3/uL (ref 0.7–4.0)
MCH: 29.1 pg (ref 26.0–34.0)
MCHC: 32.1 g/dL (ref 30.0–36.0)
MCV: 90.6 fL (ref 80.0–100.0)
Monocytes Absolute: 0.8 10*3/uL (ref 0.1–1.0)
Monocytes Relative: 4 %
Neutro Abs: 16.4 10*3/uL — ABNORMAL HIGH (ref 1.7–7.7)
Neutrophils Relative %: 85 %
Platelet Count: 156 10*3/uL (ref 150–400)
RBC: 3.61 MIL/uL — ABNORMAL LOW (ref 4.22–5.81)
RDW: 14.4 % (ref 11.5–15.5)
WBC Count: 19.4 10*3/uL — ABNORMAL HIGH (ref 4.0–10.5)
nRBC: 0 % (ref 0.0–0.2)

## 2023-04-20 LAB — CMP (CANCER CENTER ONLY)
ALT: 8 U/L (ref 0–44)
AST: 14 U/L — ABNORMAL LOW (ref 15–41)
Albumin: 3.5 g/dL (ref 3.5–5.0)
Alkaline Phosphatase: 107 U/L (ref 38–126)
Anion gap: 5 (ref 5–15)
BUN: 15 mg/dL (ref 8–23)
CO2: 27 mmol/L (ref 22–32)
Calcium: 9.3 mg/dL (ref 8.9–10.3)
Chloride: 105 mmol/L (ref 98–111)
Creatinine: 1.1 mg/dL (ref 0.61–1.24)
GFR, Estimated: >60 mL/min (ref >60)
Glucose, Bld: 132 mg/dL — ABNORMAL HIGH (ref 70–99)
Potassium: 3.9 mmol/L (ref 3.5–5.1)
Sodium: 137 mmol/L (ref 135–145)
Total Bilirubin: 0.4 mg/dL (ref 0.0–1.2)
Total Protein: 6.7 g/dL (ref 6.5–8.1)

## 2023-04-20 MED ORDER — SODIUM CHLORIDE 0.9% FLUSH
10.0000 mL | Freq: Once | INTRAVENOUS | Status: AC
  Administered 2023-04-20: 10 mL

## 2023-04-20 MED ORDER — HEPARIN SOD (PORK) LOCK FLUSH 100 UNIT/ML IV SOLN
500.0000 [IU] | Freq: Once | INTRAVENOUS | Status: AC
  Administered 2023-04-20: 500 [IU]

## 2023-04-22 NOTE — Progress Notes (Signed)
[facility] OFFICE PROGRESS NOTE  [name], PA-C [address] [place] [address]  DIAGNOSIS: Stage IV (T3, N1, M1b) non-small cell lung cancer favoring squamous cell carcinoma presented with superior segment left lower lobe lung mass with invasion of the chest wall and destruction of the posterior left sixth and seventh ribs as well as left hilar adenopathy and suspicious pleural metastasis diagnosed in March 2023   PRIOR THERAPY: 1) Palliative radiotherapy to the left lower lobe lung mass with chest wall invasion under the care of Dr. Roselind Messier. 2) Palliative systemic chemotherapy with carboplatin for AUC of 5, paclitaxel 175 Mg/M2 and Libtayo (Cempilimab) 350 Mg IV every 3 weeks with Neulasta support.  First dose was given on July 18, 2021.  Status post 4 cycles. 3) Maintenance treatment with Libtayo (Cempilimab) 350 Mg IV every 3 weeks, first dose October 10, 2021 status post 3 cycles.  Discontinued secondary to intolerance with immunotherapy mediated colitis and persistent diarrhea. 4) status post palliative radiotherapy to the progressive disease in the right adrenal gland as well as the left chest under the care of Dr. Roselind Messier completed October 24, 2022  CURRENT THERAPY: Systemic chemotherapy with carboplatin for AUC of 5 and paclitaxel 175 Mg/M2 every 3 weeks with Neulasta support first dose April 06, 2023.   INTERVAL HISTORY: Edward Beck 78 y.o. male returns to the clinic today for a follow-up visit.  The patient was last seen by Dr. Arbutus Ped on 04/07/2023.  The patient is currently receiving treatment with chemotherapy and immunotherapy.  He underwent cycle #1 of treatment on 04/06/2023 and tolerated it well overall without any major adverse side effects.  The patient was reporting a cough at his last appointment and nasal congestion per chart review. He was treated with an antibiotic by his PCP without any appreciable improvement. Overall, he does think his  symptoms are improving somewhat. He is taking mucinex and Claritin for the congestion. He has saline spray. He may cough secondary to post-nasal drainage. The cough is worse after getting up but mostly subsides once he starts moving around. He has been using robitussin.   The patient did not eat anything yet today. His BP is a little soft today. He is not on any antihypertension medication. He takes his BP at home. His BP was 110 systolic yesterday.   He denies any fever, chills, night sweats, or unexplained weight loss. He reports his appetite is "pretty good". He denies any shortness of breath unless with certain activities such as going up and down stairs. He denies any hemoptysis. At his last appointment, he had reported hemoptysis but in retrospect, he thinks the the blood was secondary to nasal bleeding/post-nasal drainage. Denies any nausea, vomiting, or constipation.  Denies any headache or visual changes. He denies rashes or skin changes. He is here today for evaluation repeat blood work before undergoing cycle #2.  MEDICAL HISTORY: Past Medical History:  Diagnosis Date   Condyloma acuminatum of scrotum    COVID 2021   December 2022 - mild   History of radiation therapy    Left Lung- 07/23/21-08/02/21- Dr. Antony Blackbird   History of radiation therapy    Left chest, right Kidney- 10/14/22-10/24/22- Dr. Antony Blackbird   Lung cancer [facility])    Pneumonia    Scrotal lesion     ALLERGIES:  is allergic to tamiflu [oseltamivir phosphate].  MEDICATIONS:  Current Outpatient Medications  Medication Sig Dispense Refill   benzonatate (TESSALON) 100 MG capsule Take  1 capsule (100 mg total) by mouth 3 (three) times daily as needed. 30 capsule 2   Cyanocobalamin (B-12) 1000 MCG TABS Take 1 tablet by mouth daily.     diphenoxylate-atropine (LOMOTIL) 2.5-0.025 MG tablet TAKE 1 TABLET BY MOUTH FOUR TIMES DAILY AS NEEDED FOR DIARRHEA OR LOOSE STOOLS 30 tablet 1   finasteride (PROSCAR) 5 MG tablet Take 1  tablet (5 mg total) by mouth daily. 90 tablet 3   gabapentin (NEURONTIN) 100 MG capsule Take 1 capsule (100 mg total) by mouth 3 (three) times daily. 90 capsule 2   lidocaine-prilocaine (EMLA) cream Apply to the Port-A-Cath site 30 minutes before chemotherapy. 30 g 0   mirtazapine (REMERON) 15 MG tablet Take 1 tablet (15 mg total) by mouth at bedtime. 30 tablet 2   omeprazole (PRILOSEC) 20 MG capsule Take 1 capsule (20 mg total) by mouth daily. 30 capsule 2   potassium chloride SA (KLOR-CON M) 20 MEQ tablet Take 1 tablet (20 mEq total) by mouth 2 (two) times daily. 14 tablet 0   predniSONE (DELTASONE) 10 MG tablet 6 tablet p.o. daily for 7 days followed by 5 tablet p.o. daily for 7 days followed by 4 tablet p.o. daily for 7 days followed by 2 tablets p.o. daily for 7 days followed by 1 tablet p.o. daily for 7 days. 150 tablet 0   prochlorperazine (COMPAZINE) 10 MG tablet Take 1 tablet (10 mg total) by mouth every 6 (six) hours as needed for nausea or vomiting. 30 tablet 0   prochlorperazine (COMPAZINE) 10 MG tablet Take 1 tablet (10 mg total) by mouth every 6 (six) hours as needed. 30 tablet 2   triamcinolone cream (KENALOG) 0.1 % Apply 1 application  topically 2 (two) times daily as needed (dry skin).     umeclidinium-vilanterol (ANORO ELLIPTA) 62.5-25 MCG/ACT AEPB Inhale 1 puff into the lungs daily. 7 each 0   No current facility-administered medications for this visit.   Facility-Administered Medications Ordered in Other Visits  Medication Dose Route Frequency Provider Last Rate Last Admin   heparin lock flush 100 unit/mL  500 Units Intravenous Once Asuncion Tapscott L, PA-C       sodium chloride flush (NS) 0.9 % injection 10 mL  10 mL Intravenous PRN Octave Montrose L, PA-C        SURGICAL HISTORY:  Past Surgical History:  Procedure Laterality Date   BRONCHIAL BIOPSY  07/02/2021   Procedure: BRONCHIAL BIOPSIES;  Surgeon: Josephine Igo, DO;  Location: [facility];   Service: Pulmonary;;   BRONCHIAL BRUSHINGS  07/02/2021   Procedure: BRONCHIAL BRUSHINGS;  Surgeon: Josephine Igo, DO;  Location: [facility];  Service: Pulmonary;;   BRONCHIAL NEEDLE ASPIRATION BIOPSY  07/02/2021   Procedure: BRONCHIAL NEEDLE ASPIRATION BIOPSIES;  Surgeon: Josephine Igo, DO;  Location: [facility];  Service: Pulmonary;;   HERNIA REPAIR  2009   left inguinal   IR IMAGING GUIDED PORT INSERTION  07/16/2021   SCROTAL EXPLORATION  11/21/2011   Procedure: SCROTUM EXPLORATION;  Surgeon: Lindaann Slough, MD;  Location: [facility];  Service: Urology;  Laterality: N/A;  EXCISION, BIOPSY SCROTAL CONDYLOMA    VIDEO BRONCHOSCOPY WITH ENDOBRONCHIAL ULTRASOUND Bilateral 07/02/2021   Procedure: VIDEO BRONCHOSCOPY WITH ENDOBRONCHIAL ULTRASOUND;  Surgeon: Josephine Igo, DO;  Location: [facility];  Service: Pulmonary;  Laterality: Bilateral;    REVIEW OF SYSTEMS:   Review of Systems  Constitutional: Negative for appetite change, chills, fatigue, fever and unexpected weight change.  HENT: Positive for nasal congestion. Negative  for mouth sores, nosebleeds, sore throat and trouble swallowing.   Eyes: Negative for eye problems and icterus.  Respiratory: Positive for intermittent cough. Intermittent shortness of breath with exertion depending on the activity. Negative for hemoptysis and wheezing.   Cardiovascular: Negative for chest pain and leg swelling.  Gastrointestinal: Negative for abdominal pain, constipation, diarrhea, nausea and vomiting.  Genitourinary: Negative for bladder incontinence, difficulty urinating, dysuria, frequency and hematuria.   Musculoskeletal: Negative for back pain, gait problem, neck pain and neck stiffness.  Skin: Negative for itching and rash.  Neurological: Negative for dizziness, extremity weakness, gait problem, headaches, light-headedness and seizures.  Hematological: Negative for adenopathy. Does not bruise/bleed easily.  Psychiatric/Behavioral: Negative  for confusion, depression and sleep disturbance. The patient is not nervous/anxious.     PHYSICAL EXAMINATION:  Blood pressure (!) 97/59, pulse 86, temperature (!) 97.3 F (36.3 C), temperature source Temporal, resp. rate 16, weight 135 lb 8 oz (61.5 kg), SpO2 97%.  ECOG PERFORMANCE STATUS: 1  Physical Exam  Constitutional: Oriented to person, place, and time and thin appearing male and in no distress.   HENT:  Head: Normocephalic and atraumatic.  Mouth/Throat: Oropharynx is clear and moist. No oropharyngeal exudate.  Eyes: Conjunctivae are normal. Right eye exhibits no discharge. Left eye exhibits no discharge. No scleral icterus.  Neck: Normal range of motion. Neck supple.  Cardiovascular: Normal rate, regular rhythm, normal heart sounds and intact distal pulses.   Pulmonary/Chest: Effort normal and breath sounds normal. No respiratory distress. No wheezes. No rales.  Abdominal: Soft. Bowel sounds are normal. Exhibits no distension and no mass. There is no tenderness.  Musculoskeletal: Normal range of motion. Exhibits no edema.  Lymphadenopathy:    No cervical adenopathy.  Neurological: Alert and oriented to person, place, and time. Exhibits normal muscle tone. Gait normal. Coordination normal.  Skin: Skin is warm and dry. No rash noted. Not diaphoretic. No erythema. No pallor.  Psychiatric: Mood, memory and judgment normal.  Vitals reviewed.  LABORATORY DATA: Lab Results  Component Value Date   WBC 9.2 04/27/2023   HGB 10.7 (L) 04/27/2023   HCT 33.4 (L) 04/27/2023   MCV 89.1 04/27/2023   PLT 147 (L) 04/27/2023      Chemistry      Component Value Date/Time   NA 134 (L) 04/27/2023 0857   K 4.6 04/27/2023 0857   CL 102 04/27/2023 0857   CO2 26 04/27/2023 0857   BUN 19 04/27/2023 0857   CREATININE 1.24 04/27/2023 0857      Component Value Date/Time   CALCIUM 9.9 04/27/2023 0857   ALKPHOS 98 04/27/2023 0857   AST 14 (L) 04/27/2023 0857   ALT 7 04/27/2023 0857    BILITOT 0.6 04/27/2023 0857       RADIOGRAPHIC STUDIES:  No results found.   ASSESSMENT/PLAN:  This is a very pleasant 78 year old Caucasian male with stage IV (T3, N1, M1 B) non-small cell lung cancer, favoring squamous cell carcinoma.  The patient presented with a superior segment left upper lobe lung mass with invasion of the chest and destruction of the posterior left sixth and seventh ribs as well as left hilar adenopathy.  The patient has suspicious pleural metastases.  The patient was diagnosed in March 2023.    The patient underwent palliative radiation to left lower lobe lung mass and chest wall lesion under the care of Dr. Roselind Messier.   He then underwent systemic chemotherapy with carboplatin for an AUC 5, paclitaxel 175 mg per metered squared, Libtayo 350  mg IV every 3 weeks.  He completed 4 cycles this was started on 07/18/2021.    He then started maintenance treatment with single agent Libtayo 350 mg IV every 3 weeks starting 10/10/2021.  Cycle 3 was delayed due to significant fatigue and weakness. This was discontinued due to suspicious colitis and diarrhea.    He is also status post palliative radiotherapy to the progressive disease in the right adrenal gland as well as the left chest under the care of Dr. Roselind Messier completed October 24, 2022.     The scan showed progression with progressive RML mass and LLL/posterior perihilar lesion suggesting additional site of recurrence in December.       Dr. Arbutus Ped recommends assuming his systemic treatment.  Since the patient had significant intolerance to immunotherapy in the past with colitis, Dr. Arbutus Ped is going to recommend resuming chemotherapy with carboplatin for an AUC of 5 and paclitaxel 175 mg/m for 4-6 cycles.  If he has good tolerance we may go up to 6 cycles.   He is status post 1 cycle and tolerated it well  Labs were reviewed.  Recommend that he proceed cycle #2 today scheduled.  We will see him back for follow-up visit in 3  weeks for evaluation repeat blood work before undergoing cycle #3.  He will continue using mucinex for his congestion. Overall, he believes this is slowly getting better. We reviewed signs and symptoms that warrant repeat evaluation such as fevers, shortness of breath, worsening cough, etc.   We will give him food and drink while in the infusion room today and check his BP. We will encourage him to hydrate well at home and monitor his BP at home. If his BP continues to be a little low, then we can consider additional IVF.   The patient was advised to call immediately if he has any concerning symptoms in the interval. The patient voices understanding of current disease status and treatment options and is in agreement with the current care plan. All questions were answered. The patient knows to call the clinic with any problems, questions or concerns. We can certainly see the patient much sooner if necessary     Orders Placed This Encounter  Procedures   CBC with Differential (Cancer Center Only)    Standing Status:   Standing    Number of Occurrences:   10    Expiration Date:   04/26/2024   CMP (Cancer Center only)    Standing Status:   Standing    Number of Occurrences:   10    Expiration Date:   04/26/2024    The total time spent in the appointment was 20-29 minutes  Creighton Longley L Janmarie Smoot, PA-C 04/27/23

## 2023-04-27 ENCOUNTER — Encounter: Payer: Self-pay | Admitting: Internal Medicine

## 2023-04-27 ENCOUNTER — Other Ambulatory Visit: Payer: Self-pay | Admitting: Physician Assistant

## 2023-04-27 ENCOUNTER — Encounter: Payer: Self-pay | Admitting: Physician Assistant

## 2023-04-27 ENCOUNTER — Inpatient Hospital Stay (HOSPITAL_BASED_OUTPATIENT_CLINIC_OR_DEPARTMENT_OTHER): Payer: Medicare Other | Admitting: Physician Assistant

## 2023-04-27 ENCOUNTER — Inpatient Hospital Stay: Payer: Medicare Other

## 2023-04-27 VITALS — BP 97/59 | HR 86 | Temp 97.3°F | Resp 16 | Wt 135.5 lb

## 2023-04-27 VITALS — BP 96/61 | HR 81

## 2023-04-27 DIAGNOSIS — Z5189 Encounter for other specified aftercare: Secondary | ICD-10-CM | POA: Diagnosis not present

## 2023-04-27 DIAGNOSIS — Z95828 Presence of other vascular implants and grafts: Secondary | ICD-10-CM

## 2023-04-27 DIAGNOSIS — C3492 Malignant neoplasm of unspecified part of left bronchus or lung: Secondary | ICD-10-CM

## 2023-04-27 DIAGNOSIS — R059 Cough, unspecified: Secondary | ICD-10-CM | POA: Diagnosis not present

## 2023-04-27 DIAGNOSIS — C3432 Malignant neoplasm of lower lobe, left bronchus or lung: Secondary | ICD-10-CM | POA: Diagnosis not present

## 2023-04-27 DIAGNOSIS — C7989 Secondary malignant neoplasm of other specified sites: Secondary | ICD-10-CM | POA: Diagnosis not present

## 2023-04-27 DIAGNOSIS — C7951 Secondary malignant neoplasm of bone: Secondary | ICD-10-CM | POA: Diagnosis not present

## 2023-04-27 DIAGNOSIS — Z5112 Encounter for antineoplastic immunotherapy: Secondary | ICD-10-CM

## 2023-04-27 DIAGNOSIS — Z5111 Encounter for antineoplastic chemotherapy: Secondary | ICD-10-CM

## 2023-04-27 DIAGNOSIS — I959 Hypotension, unspecified: Secondary | ICD-10-CM

## 2023-04-27 LAB — CMP (CANCER CENTER ONLY)
ALT: 7 U/L (ref 0–44)
AST: 14 U/L — ABNORMAL LOW (ref 15–41)
Albumin: 3.8 g/dL (ref 3.5–5.0)
Alkaline Phosphatase: 98 U/L (ref 38–126)
Anion gap: 6 (ref 5–15)
BUN: 19 mg/dL (ref 8–23)
CO2: 26 mmol/L (ref 22–32)
Calcium: 9.9 mg/dL (ref 8.9–10.3)
Chloride: 102 mmol/L (ref 98–111)
Creatinine: 1.24 mg/dL (ref 0.61–1.24)
GFR, Estimated: 59 mL/min — ABNORMAL LOW (ref >60)
Glucose, Bld: 97 mg/dL (ref 70–99)
Potassium: 4.6 mmol/L (ref 3.5–5.1)
Sodium: 134 mmol/L — ABNORMAL LOW (ref 135–145)
Total Bilirubin: 0.6 mg/dL (ref 0.0–1.2)
Total Protein: 7.7 g/dL (ref 6.5–8.1)

## 2023-04-27 LAB — CBC WITH DIFFERENTIAL (CANCER CENTER ONLY)
Abs Immature Granulocytes: 0.14 10*3/uL — ABNORMAL HIGH (ref 0.00–0.07)
Basophils Absolute: 0.1 10*3/uL (ref 0.0–0.1)
Basophils Relative: 1 %
Eosinophils Absolute: 0.2 10*3/uL (ref 0.0–0.5)
Eosinophils Relative: 2 %
HCT: 33.4 % — ABNORMAL LOW (ref 39.0–52.0)
Hemoglobin: 10.7 g/dL — ABNORMAL LOW (ref 13.0–17.0)
Immature Granulocytes: 2 %
Lymphocytes Relative: 6 %
Lymphs Abs: 0.5 10*3/uL — ABNORMAL LOW (ref 0.7–4.0)
MCH: 28.5 pg (ref 26.0–34.0)
MCHC: 32 g/dL (ref 30.0–36.0)
MCV: 89.1 fL (ref 80.0–100.0)
Monocytes Absolute: 0.6 10*3/uL (ref 0.1–1.0)
Monocytes Relative: 7 %
Neutro Abs: 7.6 10*3/uL (ref 1.7–7.7)
Neutrophils Relative %: 82 %
Platelet Count: 147 10*3/uL — ABNORMAL LOW (ref 150–400)
RBC: 3.75 MIL/uL — ABNORMAL LOW (ref 4.22–5.81)
RDW: 15.3 % (ref 11.5–15.5)
WBC Count: 9.2 10*3/uL (ref 4.0–10.5)
nRBC: 0 % (ref 0.0–0.2)

## 2023-04-27 MED ORDER — PALONOSETRON HCL INJECTION 0.25 MG/5ML
0.2500 mg | Freq: Once | INTRAVENOUS | Status: AC
  Administered 2023-04-27: 0.25 mg via INTRAVENOUS
  Filled 2023-04-27: qty 5

## 2023-04-27 MED ORDER — SODIUM CHLORIDE 0.9 % IV SOLN
175.0000 mg/m2 | Freq: Once | INTRAVENOUS | Status: AC
  Administered 2023-04-27: 318 mg via INTRAVENOUS
  Filled 2023-04-27: qty 53

## 2023-04-27 MED ORDER — SODIUM CHLORIDE 0.9% FLUSH
10.0000 mL | INTRAVENOUS | Status: DC | PRN
  Administered 2023-04-27: 10 mL

## 2023-04-27 MED ORDER — CARBOPLATIN CHEMO INJECTION 450 MG/45ML
319.5000 mg | Freq: Once | INTRAVENOUS | Status: AC
  Administered 2023-04-27: 320 mg via INTRAVENOUS
  Filled 2023-04-27: qty 32

## 2023-04-27 MED ORDER — HEPARIN SOD (PORK) LOCK FLUSH 100 UNIT/ML IV SOLN
500.0000 [IU] | Freq: Once | INTRAVENOUS | Status: AC | PRN
  Administered 2023-04-27: 500 [IU]

## 2023-04-27 MED ORDER — SODIUM CHLORIDE 0.9 % IV SOLN: Freq: Once | INTRAVENOUS | Status: AC

## 2023-04-27 MED ORDER — SODIUM CHLORIDE 0.9% FLUSH
10.0000 mL | Freq: Once | INTRAVENOUS | Status: AC
  Administered 2023-04-27: 10 mL

## 2023-04-27 MED ORDER — BENZONATATE 100 MG PO CAPS: 100.0000 mg | ORAL_CAPSULE | Freq: Three times a day (TID) | ORAL | 2 refills | Status: DC | PRN

## 2023-04-27 MED ORDER — DIPHENHYDRAMINE HCL 50 MG/ML IJ SOLN
50.0000 mg | Freq: Once | INTRAMUSCULAR | Status: AC
  Administered 2023-04-27: 50 mg via INTRAVENOUS
  Filled 2023-04-27: qty 1

## 2023-04-27 MED ORDER — SODIUM CHLORIDE 0.9 % IV SOLN
150.0000 mg | Freq: Once | INTRAVENOUS | Status: AC
  Administered 2023-04-27: 150 mg via INTRAVENOUS
  Filled 2023-04-27: qty 150

## 2023-04-27 MED ORDER — FAMOTIDINE IN NACL 20-0.9 MG/50ML-% IV SOLN
20.0000 mg | Freq: Once | INTRAVENOUS | Status: AC
  Administered 2023-04-27: 20 mg via INTRAVENOUS
  Filled 2023-04-27: qty 50

## 2023-04-27 MED ORDER — DEXAMETHASONE SODIUM PHOSPHATE 10 MG/ML IJ SOLN
10.0000 mg | Freq: Once | INTRAMUSCULAR | Status: AC
  Administered 2023-04-27: 10 mg via INTRAVENOUS
  Filled 2023-04-27: qty 1

## 2023-04-27 MED ORDER — SODIUM CHLORIDE 0.9 % IV SOLN: INTRAVENOUS | Status: DC

## 2023-04-27 NOTE — Patient Instructions (Signed)
 [facility] - A DEPT OF [facility]  Discharge Instructions: Thank you for choosing [facility] to provide your oncology and hematology care.   If you have a lab appointment with the Cancer Center, please go directly to the Cancer Center and check in at the registration area.   Wear comfortable clothing and clothing appropriate for easy access to any Portacath or PICC line.   We strive to give you quality time with your provider. You may need to reschedule your appointment if you arrive late (15 or more minutes).  Arriving late affects you and other patients whose appointments are after yours.  Also, if you miss three or more appointments without notifying the office, you may be dismissed from the clinic at the provider's discretion.      For prescription refill requests, have your pharmacy contact our office and allow 72 hours for refills to be completed.    Today you received the following chemotherapy and/or immunotherapy agents Paclitaxel, Carboplatin   To help prevent nausea and vomiting after your treatment, we encourage you to take your nausea medication as directed.  BELOW ARE SYMPTOMS THAT SHOULD BE REPORTED IMMEDIATELY: *FEVER GREATER THAN 100.4 F (38 C) OR HIGHER *CHILLS OR SWEATING *NAUSEA AND VOMITING THAT IS NOT CONTROLLED WITH YOUR NAUSEA MEDICATION *UNUSUAL SHORTNESS OF BREATH *UNUSUAL BRUISING OR BLEEDING *URINARY PROBLEMS (pain or burning when urinating, or frequent urination) *BOWEL PROBLEMS (unusual diarrhea, constipation, pain near the anus) TENDERNESS IN MOUTH AND THROAT WITH OR WITHOUT PRESENCE OF ULCERS (sore throat, sores in mouth, or a toothache) UNUSUAL RASH, SWELLING OR PAIN  UNUSUAL VAGINAL DISCHARGE OR ITCHING   Items with * indicate a potential emergency and should be followed up as soon as possible or go to the Emergency Department if any problems should occur.  Please show the CHEMOTHERAPY ALERT CARD or  IMMUNOTHERAPY ALERT CARD at check-in to the Emergency Department and triage nurse.  Should you have questions after your visit or need to cancel or reschedule your appointment, please contact [facility] - A DEPT OF [facility]  Dept: [phone]  and follow the prompts.  Office hours are 8:00 a.m. to 4:30 p.m. Monday - Friday. Please note that voicemails left after 4:00 p.m. may not be returned until the following business day.  We are closed weekends and major holidays. You have access to a nurse at all times for urgent questions. Please call the main number to the clinic Dept: [phone] and follow the prompts.   For any non-urgent questions, you may also contact your provider using MyChart. We now offer e-Visits for anyone 1 and older to request care online for non-urgent symptoms. For details visit [email or web address].   Also download the MyChart app! Go to the app store, search "MyChart", open the app, select [facility], and log in with your MyChart username and password.

## 2023-04-29 ENCOUNTER — Inpatient Hospital Stay: Payer: Medicare Other

## 2023-04-29 VITALS — BP 90/57 | HR 100 | Temp 97.4°F

## 2023-04-29 DIAGNOSIS — C7951 Secondary malignant neoplasm of bone: Secondary | ICD-10-CM | POA: Diagnosis not present

## 2023-04-29 DIAGNOSIS — C7989 Secondary malignant neoplasm of other specified sites: Secondary | ICD-10-CM | POA: Diagnosis not present

## 2023-04-29 DIAGNOSIS — Z5111 Encounter for antineoplastic chemotherapy: Secondary | ICD-10-CM | POA: Diagnosis not present

## 2023-04-29 DIAGNOSIS — C3432 Malignant neoplasm of lower lobe, left bronchus or lung: Secondary | ICD-10-CM | POA: Diagnosis not present

## 2023-04-29 DIAGNOSIS — C3492 Malignant neoplasm of unspecified part of left bronchus or lung: Secondary | ICD-10-CM

## 2023-04-29 DIAGNOSIS — Z5189 Encounter for other specified aftercare: Secondary | ICD-10-CM | POA: Diagnosis not present

## 2023-04-29 MED ORDER — PEGFILGRASTIM-CBQV 6 MG/0.6ML ~~LOC~~ SOSY
6.0000 mg | PREFILLED_SYRINGE | Freq: Once | SUBCUTANEOUS | Status: AC
  Administered 2023-04-29: 6 mg via SUBCUTANEOUS
  Filled 2023-04-29: qty 0.6

## 2023-05-01 ENCOUNTER — Encounter: Payer: Self-pay | Admitting: Physician Assistant

## 2023-05-01 ENCOUNTER — Encounter: Payer: Self-pay | Admitting: Internal Medicine

## 2023-05-04 ENCOUNTER — Inpatient Hospital Stay: Payer: Medicare Other

## 2023-05-04 DIAGNOSIS — C3432 Malignant neoplasm of lower lobe, left bronchus or lung: Secondary | ICD-10-CM | POA: Diagnosis not present

## 2023-05-04 DIAGNOSIS — C7951 Secondary malignant neoplasm of bone: Secondary | ICD-10-CM | POA: Diagnosis not present

## 2023-05-04 DIAGNOSIS — C3492 Malignant neoplasm of unspecified part of left bronchus or lung: Secondary | ICD-10-CM

## 2023-05-04 DIAGNOSIS — Z5111 Encounter for antineoplastic chemotherapy: Secondary | ICD-10-CM | POA: Diagnosis not present

## 2023-05-04 DIAGNOSIS — C7989 Secondary malignant neoplasm of other specified sites: Secondary | ICD-10-CM | POA: Diagnosis not present

## 2023-05-04 DIAGNOSIS — Z5189 Encounter for other specified aftercare: Secondary | ICD-10-CM | POA: Diagnosis not present

## 2023-05-04 DIAGNOSIS — Z95828 Presence of other vascular implants and grafts: Secondary | ICD-10-CM

## 2023-05-04 LAB — CBC WITH DIFFERENTIAL (CANCER CENTER ONLY)
Abs Immature Granulocytes: 0.06 10*3/uL (ref 0.00–0.07)
Basophils Absolute: 0.1 10*3/uL (ref 0.0–0.1)
Basophils Relative: 3 %
Eosinophils Absolute: 0.1 10*3/uL (ref 0.0–0.5)
Eosinophils Relative: 5 %
HCT: 31.5 % — ABNORMAL LOW (ref 39.0–52.0)
Hemoglobin: 10.2 g/dL — ABNORMAL LOW (ref 13.0–17.0)
Immature Granulocytes: 3 %
Lymphocytes Relative: 22 %
Lymphs Abs: 0.5 10*3/uL — ABNORMAL LOW (ref 0.7–4.0)
MCH: 28.5 pg (ref 26.0–34.0)
MCHC: 32.4 g/dL (ref 30.0–36.0)
MCV: 88 fL (ref 80.0–100.0)
Monocytes Absolute: 0.5 10*3/uL (ref 0.1–1.0)
Monocytes Relative: 24 %
Neutro Abs: 1 10*3/uL — ABNORMAL LOW (ref 1.7–7.7)
Neutrophils Relative %: 43 %
Platelet Count: 54 10*3/uL — ABNORMAL LOW (ref 150–400)
RBC: 3.58 MIL/uL — ABNORMAL LOW (ref 4.22–5.81)
RDW: 15.7 % — ABNORMAL HIGH (ref 11.5–15.5)
Smear Review: NORMAL
WBC Count: 2.2 10*3/uL — ABNORMAL LOW (ref 4.0–10.5)
nRBC: 0 % (ref 0.0–0.2)

## 2023-05-04 LAB — CMP (CANCER CENTER ONLY)
ALT: 10 U/L (ref 0–44)
AST: 13 U/L — ABNORMAL LOW (ref 15–41)
Albumin: 3.6 g/dL (ref 3.5–5.0)
Alkaline Phosphatase: 92 U/L (ref 38–126)
Anion gap: 5 (ref 5–15)
BUN: 32 mg/dL — ABNORMAL HIGH (ref 8–23)
CO2: 24 mmol/L (ref 22–32)
Calcium: 9.8 mg/dL (ref 8.9–10.3)
Chloride: 106 mmol/L (ref 98–111)
Creatinine: 1.18 mg/dL (ref 0.61–1.24)
GFR, Estimated: >60 mL/min (ref >60)
Glucose, Bld: 96 mg/dL (ref 70–99)
Potassium: 4.4 mmol/L (ref 3.5–5.1)
Sodium: 135 mmol/L (ref 135–145)
Total Bilirubin: 0.5 mg/dL (ref 0.0–1.2)
Total Protein: 7.1 g/dL (ref 6.5–8.1)

## 2023-05-04 MED ORDER — HEPARIN SOD (PORK) LOCK FLUSH 100 UNIT/ML IV SOLN
500.0000 [IU] | Freq: Once | INTRAVENOUS | Status: AC
  Administered 2023-05-04: 500 [IU]

## 2023-05-04 MED ORDER — SODIUM CHLORIDE 0.9% FLUSH
10.0000 mL | Freq: Once | INTRAVENOUS | Status: AC
  Administered 2023-05-04: 10 mL

## 2023-05-05 ENCOUNTER — Encounter: Payer: Self-pay | Admitting: Internal Medicine

## 2023-05-05 ENCOUNTER — Telehealth: Payer: Self-pay

## 2023-05-05 NOTE — Telephone Encounter (Signed)
Spoke with patients wife and reviewed bleeding and neutropenic precautions with her.  Wife stated that he is really tired, having some diarrhea (Imodium at home, if continues over the next few days to give our office a call), and not eating much because food doesn't taste good.  Informed wife to try protein shakes for an easy protein, encourage patient to stay active and rest when he can. Informed wife to give [place] a call if signs of infection appear.

## 2023-05-11 ENCOUNTER — Inpatient Hospital Stay: Payer: Medicare Other | Attending: Internal Medicine

## 2023-05-11 DIAGNOSIS — Z5189 Encounter for other specified aftercare: Secondary | ICD-10-CM | POA: Diagnosis not present

## 2023-05-11 DIAGNOSIS — Z5111 Encounter for antineoplastic chemotherapy: Secondary | ICD-10-CM | POA: Diagnosis not present

## 2023-05-11 DIAGNOSIS — C7951 Secondary malignant neoplasm of bone: Secondary | ICD-10-CM | POA: Insufficient documentation

## 2023-05-11 DIAGNOSIS — C3432 Malignant neoplasm of lower lobe, left bronchus or lung: Secondary | ICD-10-CM | POA: Diagnosis not present

## 2023-05-11 DIAGNOSIS — Z95828 Presence of other vascular implants and grafts: Secondary | ICD-10-CM

## 2023-05-11 DIAGNOSIS — C3492 Malignant neoplasm of unspecified part of left bronchus or lung: Secondary | ICD-10-CM

## 2023-05-11 LAB — CBC WITH DIFFERENTIAL (CANCER CENTER ONLY)
Abs Immature Granulocytes: 0.79 10*3/uL — ABNORMAL HIGH (ref 0.00–0.07)
Basophils Absolute: 0.1 10*3/uL (ref 0.0–0.1)
Basophils Relative: 1 %
Eosinophils Absolute: 0 10*3/uL (ref 0.0–0.5)
Eosinophils Relative: 0 %
HCT: 30.2 % — ABNORMAL LOW (ref 39.0–52.0)
Hemoglobin: 9.8 g/dL — ABNORMAL LOW (ref 13.0–17.0)
Immature Granulocytes: 5 %
Lymphocytes Relative: 5 %
Lymphs Abs: 0.8 10*3/uL (ref 0.7–4.0)
MCH: 29.8 pg (ref 26.0–34.0)
MCHC: 32.5 g/dL (ref 30.0–36.0)
MCV: 91.8 fL (ref 80.0–100.0)
Monocytes Absolute: 0.8 10*3/uL (ref 0.1–1.0)
Monocytes Relative: 5 %
Neutro Abs: 13.7 10*3/uL — ABNORMAL HIGH (ref 1.7–7.7)
Neutrophils Relative %: 84 %
Platelet Count: 116 10*3/uL — ABNORMAL LOW (ref 150–400)
RBC: 3.29 MIL/uL — ABNORMAL LOW (ref 4.22–5.81)
RDW: 17 % — ABNORMAL HIGH (ref 11.5–15.5)
WBC Count: 16.2 10*3/uL — ABNORMAL HIGH (ref 4.0–10.5)
nRBC: 0 % (ref 0.0–0.2)

## 2023-05-11 LAB — CMP (CANCER CENTER ONLY)
ALT: 8 U/L (ref 0–44)
AST: 15 U/L (ref 15–41)
Albumin: 3.3 g/dL — ABNORMAL LOW (ref 3.5–5.0)
Alkaline Phosphatase: 114 U/L (ref 38–126)
Anion gap: 6 (ref 5–15)
BUN: 18 mg/dL (ref 8–23)
CO2: 25 mmol/L (ref 22–32)
Calcium: 9 mg/dL (ref 8.9–10.3)
Chloride: 106 mmol/L (ref 98–111)
Creatinine: 1.14 mg/dL (ref 0.61–1.24)
GFR, Estimated: >60 mL/min (ref >60)
Glucose, Bld: 134 mg/dL — ABNORMAL HIGH (ref 70–99)
Potassium: 4 mmol/L (ref 3.5–5.1)
Sodium: 137 mmol/L (ref 135–145)
Total Bilirubin: 0.3 mg/dL (ref 0.0–1.2)
Total Protein: 6.7 g/dL (ref 6.5–8.1)

## 2023-05-11 MED ORDER — SODIUM CHLORIDE 0.9% FLUSH
10.0000 mL | Freq: Once | INTRAVENOUS | Status: AC
  Administered 2023-05-11: 10 mL

## 2023-05-11 MED ORDER — HEPARIN SOD (PORK) LOCK FLUSH 100 UNIT/ML IV SOLN
500.0000 [IU] | Freq: Once | INTRAVENOUS | Status: AC
  Administered 2023-05-11: 500 [IU]

## 2023-05-15 MED FILL — Fosaprepitant Dimeglumine For IV Infusion 150 MG (Base Eq): INTRAVENOUS | Qty: 5 | Status: AC

## 2023-05-18 ENCOUNTER — Inpatient Hospital Stay: Payer: Medicare Other

## 2023-05-18 ENCOUNTER — Inpatient Hospital Stay (HOSPITAL_BASED_OUTPATIENT_CLINIC_OR_DEPARTMENT_OTHER): Payer: Medicare Other | Admitting: Internal Medicine

## 2023-05-18 VITALS — BP 105/69 | HR 86 | Temp 98.0°F | Resp 17 | Wt 130.2 lb

## 2023-05-18 DIAGNOSIS — C3490 Malignant neoplasm of unspecified part of unspecified bronchus or lung: Secondary | ICD-10-CM

## 2023-05-18 DIAGNOSIS — Z5189 Encounter for other specified aftercare: Secondary | ICD-10-CM | POA: Diagnosis not present

## 2023-05-18 DIAGNOSIS — Z5111 Encounter for antineoplastic chemotherapy: Secondary | ICD-10-CM | POA: Diagnosis not present

## 2023-05-18 DIAGNOSIS — C3432 Malignant neoplasm of lower lobe, left bronchus or lung: Secondary | ICD-10-CM | POA: Diagnosis not present

## 2023-05-18 DIAGNOSIS — C3492 Malignant neoplasm of unspecified part of left bronchus or lung: Secondary | ICD-10-CM

## 2023-05-18 DIAGNOSIS — Z95828 Presence of other vascular implants and grafts: Secondary | ICD-10-CM

## 2023-05-18 DIAGNOSIS — C7951 Secondary malignant neoplasm of bone: Secondary | ICD-10-CM | POA: Diagnosis not present

## 2023-05-18 LAB — CBC WITH DIFFERENTIAL (CANCER CENTER ONLY)
Abs Immature Granulocytes: 0.19 10*3/uL — ABNORMAL HIGH (ref 0.00–0.07)
Basophils Absolute: 0.1 10*3/uL (ref 0.0–0.1)
Basophils Relative: 1 %
Eosinophils Absolute: 0 10*3/uL (ref 0.0–0.5)
Eosinophils Relative: 0 %
HCT: 30.4 % — ABNORMAL LOW (ref 39.0–52.0)
Hemoglobin: 9.6 g/dL — ABNORMAL LOW (ref 13.0–17.0)
Immature Granulocytes: 2 %
Lymphocytes Relative: 6 %
Lymphs Abs: 0.5 10*3/uL — ABNORMAL LOW (ref 0.7–4.0)
MCH: 29.5 pg (ref 26.0–34.0)
MCHC: 31.6 g/dL (ref 30.0–36.0)
MCV: 93.5 fL (ref 80.0–100.0)
Monocytes Absolute: 0.8 10*3/uL (ref 0.1–1.0)
Monocytes Relative: 9 %
Neutro Abs: 6.9 10*3/uL (ref 1.7–7.7)
Neutrophils Relative %: 82 %
Platelet Count: 190 10*3/uL (ref 150–400)
RBC: 3.25 MIL/uL — ABNORMAL LOW (ref 4.22–5.81)
RDW: 17.9 % — ABNORMAL HIGH (ref 11.5–15.5)
WBC Count: 8.5 10*3/uL (ref 4.0–10.5)
nRBC: 0 % (ref 0.0–0.2)

## 2023-05-18 LAB — CMP (CANCER CENTER ONLY)
ALT: 9 U/L (ref 0–44)
AST: 15 U/L (ref 15–41)
Albumin: 3.5 g/dL (ref 3.5–5.0)
Alkaline Phosphatase: 102 U/L (ref 38–126)
Anion gap: 5 (ref 5–15)
BUN: 21 mg/dL (ref 8–23)
CO2: 26 mmol/L (ref 22–32)
Calcium: 9.4 mg/dL (ref 8.9–10.3)
Chloride: 102 mmol/L (ref 98–111)
Creatinine: 1.19 mg/dL (ref 0.61–1.24)
GFR, Estimated: >60 mL/min (ref >60)
Glucose, Bld: 95 mg/dL (ref 70–99)
Potassium: 4.5 mmol/L (ref 3.5–5.1)
Sodium: 133 mmol/L — ABNORMAL LOW (ref 135–145)
Total Bilirubin: 0.5 mg/dL (ref 0.0–1.2)
Total Protein: 7.3 g/dL (ref 6.5–8.1)

## 2023-05-18 MED ORDER — PALONOSETRON HCL INJECTION 0.25 MG/5ML
0.2500 mg | Freq: Once | INTRAVENOUS | Status: AC
  Administered 2023-05-18: 0.25 mg via INTRAVENOUS
  Filled 2023-05-18: qty 5

## 2023-05-18 MED ORDER — DEXAMETHASONE SODIUM PHOSPHATE 10 MG/ML IJ SOLN
10.0000 mg | Freq: Once | INTRAMUSCULAR | Status: AC
  Administered 2023-05-18: 10 mg via INTRAVENOUS
  Filled 2023-05-18: qty 1

## 2023-05-18 MED ORDER — DIPHENHYDRAMINE HCL 50 MG/ML IJ SOLN
50.0000 mg | Freq: Once | INTRAMUSCULAR | Status: AC
  Administered 2023-05-18: 50 mg via INTRAVENOUS
  Filled 2023-05-18: qty 1

## 2023-05-18 MED ORDER — SODIUM CHLORIDE 0.9 % IV SOLN
150.0000 mg | Freq: Once | INTRAVENOUS | Status: AC
  Administered 2023-05-18: 150 mg via INTRAVENOUS
  Filled 2023-05-18: qty 150

## 2023-05-18 MED ORDER — FAMOTIDINE IN NACL 20-0.9 MG/50ML-% IV SOLN
20.0000 mg | Freq: Once | INTRAVENOUS | Status: AC
  Administered 2023-05-18: 20 mg via INTRAVENOUS
  Filled 2023-05-18: qty 50

## 2023-05-18 MED ORDER — SODIUM CHLORIDE 0.9 % IV SOLN
319.5000 mg | Freq: Once | INTRAVENOUS | Status: AC
  Administered 2023-05-18: 320 mg via INTRAVENOUS
  Filled 2023-05-18: qty 32

## 2023-05-18 MED ORDER — SODIUM CHLORIDE 0.9% FLUSH
10.0000 mL | Freq: Once | INTRAVENOUS | Status: AC
  Administered 2023-05-18: 10 mL

## 2023-05-18 MED ORDER — SODIUM CHLORIDE 0.9 % IV SOLN: INTRAVENOUS | Status: DC

## 2023-05-18 MED ORDER — SODIUM CHLORIDE 0.9 % IV SOLN
175.0000 mg/m2 | Freq: Once | INTRAVENOUS | Status: AC
  Administered 2023-05-18: 318 mg via INTRAVENOUS
  Filled 2023-05-18: qty 53

## 2023-05-18 NOTE — Progress Notes (Signed)
 [facility] [phone]   [phone]  OFFICE PROGRESS NOTE  Edward Riggs, PA-C [address] [place] [address]  DIAGNOSIS: Stage IV (T3, N1, M1b) non-small cell lung cancer favoring squamous cell carcinoma presented with superior segment left lower lobe lung mass with invasion of the chest wall and destruction of the posterior left sixth and seventh ribs as well as left hilar adenopathy and suspicious pleural metastasis diagnosed in March 2023  PRIOR THERAPY:  1) Palliative radiotherapy to the left lower lobe lung mass with chest wall invasion under the care of Edward Beck. 2) Palliative systemic chemotherapy with carboplatin  for AUC of 5, paclitaxel  175 Mg/M2 and Libtayo  (Cempilimab) 350 Mg IV every 3 weeks with Neulasta  support.  First dose was given on July 18, 2021.  Status post 4 cycles. 3) Maintenance treatment with Libtayo  (Cempilimab) 350 Mg IV every 3 weeks, first dose October 10, 2021 status post 3 cycles.  Discontinued secondary to intolerance with immunotherapy mediated colitis and persistent diarrhea. 4) status post palliative radiotherapy to the progressive disease in the right adrenal gland as well as the left chest under the care of Edward Beck completed October 24, 2022  CURRENT THERAPY: Systemic chemotherapy with carboplatin  for AUC of 5 and paclitaxel  175 Mg/M2 every 3 weeks with Neulasta  support first dose April 06, 2023.  Status post 2 cycles.  INTERVAL HISTORY:  Edward Beck 78 y.o. male returns to the clinic today for follow-up visit accompanied by his wife.Discussed the use of AI scribe software for clinical note transcription with the patient, who gave verbal consent to proceed.  History of Present Illness   Edward Beck is a 78 year old male with stage four non-small cell lung cancer squamous cell carcinoma who presents for chemotherapy treatment. He is accompanied by his wife.  He was diagnosed with stage four  non-small cell lung cancer squamous cell carcinoma in March 2023. Initial treatment included chemotherapy and immunotherapy with carboplatin , paclitaxel , and Libtayo  for four cycles, followed by maintenance Libtayo  for three additional cycles. Maintenance therapy was discontinued due to significant diarrhea attributed to the immunotherapy.  In December 2024, cancer progression was noted, leading to the resumption of chemotherapy with carboplatin  and paclitaxel . He has completed two cycles of this regimen and is here for the third cycle today. He describes the last cycle as having gone 'pretty good' with minimal side effects.  He experiences a runny nose and coughs up white phlegm, particularly in the morning, but denies any blood in the phlegm. No nausea, vomiting, or diarrhea are reported recently.  He notes a weight loss of approximately one pound per week since January, totaling around six pounds, although he mentions wearing heavy clothes during the initial weigh-in.       MEDICAL HISTORY: Past Medical History:  Diagnosis Date   Condyloma acuminatum of scrotum    COVID 2021   December 2022 - mild   History of radiation therapy    Left Lung- 07/23/21-08/02/21- Edward Beck   History of radiation therapy    Left chest, right Kidney- 10/14/22-10/24/22- Edward Beck   Lung cancer [facility])    Pneumonia    Scrotal lesion     ALLERGIES:  is allergic to tamiflu [oseltamivir phosphate].  MEDICATIONS:  Current Outpatient Medications  Medication Sig Dispense Refill   benzonatate  (TESSALON ) 100 MG capsule Take 1 capsule (100 mg total) by mouth 3 (three) times daily as needed. 30 capsule  2   Cyanocobalamin  (B-12) 1000 MCG TABS Take 1 tablet by mouth daily.     diphenoxylate -atropine  (LOMOTIL ) 2.5-0.025 MG tablet TAKE 1 TABLET BY MOUTH FOUR TIMES DAILY AS NEEDED FOR DIARRHEA OR LOOSE STOOLS 30 tablet 1   finasteride  (PROSCAR ) 5 MG tablet Take 1 tablet (5 mg total) by mouth daily. 90  tablet 3   gabapentin  (NEURONTIN ) 100 MG capsule Take 1 capsule (100 mg total) by mouth 3 (three) times daily. 90 capsule 2   lidocaine -prilocaine  (EMLA ) cream Apply to the Port-A-Cath site 30 minutes before chemotherapy. 30 g 0   mirtazapine  (REMERON ) 15 MG tablet Take 1 tablet (15 mg total) by mouth at bedtime. 30 tablet 2   omeprazole  (PRILOSEC) 20 MG capsule Take 1 capsule (20 mg total) by mouth daily. 30 capsule 2   potassium chloride  SA (KLOR-CON  M) 20 MEQ tablet Take 1 tablet (20 mEq total) by mouth 2 (two) times daily. 14 tablet 0   predniSONE  (DELTASONE ) 10 MG tablet 6 tablet p.o. daily for 7 days followed by 5 tablet p.o. daily for 7 days followed by 4 tablet p.o. daily for 7 days followed by 2 tablets p.o. daily for 7 days followed by 1 tablet p.o. daily for 7 days. 150 tablet 0   prochlorperazine  (COMPAZINE ) 10 MG tablet Take 1 tablet (10 mg total) by mouth every 6 (six) hours as needed for nausea or vomiting. 30 tablet 0   prochlorperazine  (COMPAZINE ) 10 MG tablet Take 1 tablet (10 mg total) by mouth every 6 (six) hours as needed. 30 tablet 2   triamcinolone cream (KENALOG) 0.1 % Apply 1 application  topically 2 (two) times daily as needed (dry skin).     umeclidinium-vilanterol (ANORO ELLIPTA ) 62.5-25 MCG/ACT AEPB Inhale 1 puff into the lungs daily. 7 each 0   No current facility-administered medications for this visit.   Facility-Administered Medications Ordered in Other Visits  Medication Dose Route Frequency Provider Last Rate Last Admin   heparin  lock flush 100 unit/mL  500 Units Intravenous Once Edward Beck, Edward L, PA-C       sodium chloride  flush (NS) 0.9 % injection 10 mL  10 mL Intravenous PRN Edward Beck, Edward L, PA-C        SURGICAL HISTORY:  Past Surgical History:  Procedure Laterality Date   BRONCHIAL BIOPSY  07/02/2021   Procedure: BRONCHIAL BIOPSIES;  Surgeon: Prudy Brownie, DO;  Location: [facility];  Service: Pulmonary;;   BRONCHIAL BRUSHINGS   07/02/2021   Procedure: BRONCHIAL BRUSHINGS;  Surgeon: Prudy Brownie, DO;  Location: [facility];  Service: Pulmonary;;   BRONCHIAL NEEDLE ASPIRATION BIOPSY  07/02/2021   Procedure: BRONCHIAL NEEDLE ASPIRATION BIOPSIES;  Surgeon: Prudy Brownie, DO;  Location: [facility];  Service: Pulmonary;;   HERNIA REPAIR  2009   left inguinal   IR IMAGING GUIDED PORT INSERTION  07/16/2021   SCROTAL EXPLORATION  11/21/2011   Procedure: SCROTUM EXPLORATION;  Surgeon: Jinny Mounts, MD;  Location: [facility];  Service: Urology;  Laterality: N/A;  EXCISION, BIOPSY SCROTAL CONDYLOMA    VIDEO BRONCHOSCOPY WITH ENDOBRONCHIAL ULTRASOUND Bilateral 07/02/2021   Procedure: VIDEO BRONCHOSCOPY WITH ENDOBRONCHIAL ULTRASOUND;  Surgeon: Prudy Brownie, DO;  Location: [facility];  Service: Pulmonary;  Laterality: Bilateral;    REVIEW OF SYSTEMS:  A comprehensive review of systems was negative except for: Constitutional: positive for weight loss Respiratory: positive for cough   PHYSICAL EXAMINATION: General appearance: alert, cooperative, fatigued, and no distress Head: Normocephalic, without obvious abnormality, atraumatic Neck: no adenopathy,  no JVD, supple, symmetrical, trachea midline, and thyroid  not enlarged, symmetric, no tenderness/mass/nodules Lymph nodes: Cervical, supraclavicular, and axillary nodes normal. Resp: clear to auscultation bilaterally Back: symmetric, no curvature. ROM normal. No CVA tenderness. Cardio: regular rate and rhythm, S1, S2 normal, no murmur, click, rub or gallop GI: soft, non-tender; bowel sounds normal; no masses,  no organomegaly Extremities: extremities normal, atraumatic, no cyanosis or edema  ECOG PERFORMANCE STATUS: 1 - Symptomatic but completely ambulatory  Blood pressure 105/69, pulse 86, temperature 98 F (36.7 C), temperature source Temporal, resp. rate 17, weight 130 lb 3.2 oz (59.1 kg), SpO2 99%.  LABORATORY DATA: Lab Results  Component Value Date   WBC 8.5  05/18/2023   HGB 9.6 (Beck) 05/18/2023   HCT 30.4 (Beck) 05/18/2023   MCV 93.5 05/18/2023   PLT 190 05/18/2023      Chemistry      Component Value Date/Time   NA 137 05/11/2023 1310   K 4.0 05/11/2023 1310   CL 106 05/11/2023 1310   CO2 25 05/11/2023 1310   BUN 18 05/11/2023 1310   CREATININE 1.14 05/11/2023 1310      Component Value Date/Time   CALCIUM 9.0 05/11/2023 1310   ALKPHOS 114 05/11/2023 1310   AST 15 05/11/2023 1310   ALT 8 05/11/2023 1310   BILITOT 0.3 05/11/2023 1310       RADIOGRAPHIC STUDIES: No results found.  ASSESSMENT AND PLAN: This is a very pleasant 78 years old white male with Stage IV (T3, N1, M1b) non-small cell lung cancer favoring squamous cell carcinoma presented with superior segment left lower lobe lung mass with invasion of the chest wall and destruction of the posterior left sixth and seventh ribs as well as left hilar adenopathy and suspicious pleural metastasis diagnosed in March 2023 He is currently undergoing palliative radiotherapy to the left lower lobe lung mass with chest wall invasion under the care of Edward Beck. He is also undergoing systemic chemotherapy with carboplatin  for AUC of 5, paclitaxel  175 Mg/M2 and Libtayo  (Cempilimab) 350 Mg IV every 3 weeks.  He started the first dose of his treatment on July 18, 2021.  Status post 4 cycles.   The patient started maintenance treatment with single agent Libtayo  (Cempilimab) 350 Mg IV every 3 weeks on October 10, 2021 status post 3 cycles.  His treatment was discontinued secondary to immunotherapy mediated colitis and significant diarrhea which was treated with a tapered dose of prednisone  and improved. He is also status post palliative radiotherapy to the progressive disease in the right adrenal gland as well as the left chest under the care of Edward Beck completed October 24, 2022.  He had repeat CT scan of the chest, abdomen and pelvis performed recently.  I personally and independently reviewed the scan  images and discussed the result and showed the images to the patient and his wife. His scan showed stable disease with stable extensive posttreatment changes involving the left upper lobe with radiation fibrosis and bronchiectasis but there was new 1.4 cm right infrahilar lymph node concerning for possible disease recurrence. The patient is currently on systemic chemotherapy with carboplatin  for AUC of 5 and paclitaxel  175 Mg/M2 with Neulasta  support every 3 weeks.  First dose April 06, 2023.  Status post 2 cycles.  The patient has been tolerating this treatment fairly well with no concerning adverse effects.    Stage IV Non-Small Cell Lung Cancer (NSCLC) - Squamous Cell Carcinoma Phinn Stauff, a 78 year old male, diagnosed with stage IV NSCLC  squamous cell carcinoma in March 2023. Initial treatment included four cycles of carboplatin , paclitaxel , and Libtayo , followed by three cycles of maintenance Libtayo , discontinued due to severe diarrhea. Disease progression noted in December 2024, resumed chemotherapy with carboplatin  and paclitaxel . Completed two cycles, here for the third. Reports minimal side effects: rhinorrhea and white phlegm without hemoptysis. Weight loss of approximately one pound per week since January, totaling six pounds. No nausea, vomiting, or diarrhea. Blood work adequate for treatment. Plan to proceed with the third cycle of chemotherapy today, followed by a CT scan of the chest, abdomen, and pelvis in two weeks to assess treatment response. Follow-up visit scheduled in three weeks to review scan results and determine next steps. - Administer third cycle of carboplatin  and paclitaxel  chemotherapy today - Order CT scan of chest, abdomen, and pelvis in two weeks - Schedule follow-up visit in three weeks to review scan results and assess treatment response  Weight Loss Weight loss of approximately one pound per week since January, totaling six pounds, likely multifactorial due to  ongoing cancer treatment and disease progression. - Monitor weight and nutritional status at next visit - Encourage high-calorie, high-protein diet to maintain weight  General Health Maintenance Not explicitly discussed in this visit.   He was advised to call immediately if he has any concerning symptoms in the interval. The patient voices understanding of current disease status and treatment options and is in agreement with the current care plan.  All questions were answered. The patient knows to call the clinic with any problems, questions or concerns. We can certainly see the patient much sooner if necessary.  The total time spent in the appointment was 20 minutes.  Disclaimer: This note was dictated with voice recognition software. Similar sounding words can inadvertently be transcribed and may not be corrected upon review.

## 2023-05-18 NOTE — Patient Instructions (Signed)
 [facility] - A DEPT OF [facility]  Discharge Instructions: Thank you for choosing [facility] to provide your oncology and hematology care.   If you have a lab appointment with the Cancer Center, please go directly to the Cancer Center and check in at the registration area.   Wear comfortable clothing and clothing appropriate for easy access to any Portacath or PICC line.   We strive to give you quality time with your provider. You may need to reschedule your appointment if you arrive late (15 or more minutes).  Arriving late affects you and other patients whose appointments are after yours.  Also, if you miss three or more appointments without notifying the office, you may be dismissed from the clinic at the provider's discretion.      For prescription refill requests, have your pharmacy contact our office and allow 72 hours for refills to be completed.    Today you received the following chemotherapy and/or immunotherapy agents:   Paclitaxel, Carboplatin    To help prevent nausea and vomiting after your treatment, we encourage you to take your nausea medication as directed.  BELOW ARE SYMPTOMS THAT SHOULD BE REPORTED IMMEDIATELY: *FEVER GREATER THAN 100.4 F (38 C) OR HIGHER *CHILLS OR SWEATING *NAUSEA AND VOMITING THAT IS NOT CONTROLLED WITH YOUR NAUSEA MEDICATION *UNUSUAL SHORTNESS OF BREATH *UNUSUAL BRUISING OR BLEEDING *URINARY PROBLEMS (pain or burning when urinating, or frequent urination) *BOWEL PROBLEMS (unusual diarrhea, constipation, pain near the anus) TENDERNESS IN MOUTH AND THROAT WITH OR WITHOUT PRESENCE OF ULCERS (sore throat, sores in mouth, or a toothache) UNUSUAL RASH, SWELLING OR PAIN  UNUSUAL VAGINAL DISCHARGE OR ITCHING   Items with * indicate a potential emergency and should be followed up as soon as possible or go to the Emergency Department if any problems should occur.  Please show the CHEMOTHERAPY ALERT CARD or  IMMUNOTHERAPY ALERT CARD at check-in to the Emergency Department and triage nurse.  Should you have questions after your visit or need to cancel or reschedule your appointment, please contact [facility] - A DE[facility]  Dept: [phone]  and follow the prompts.  Office hours are 8:00 a.m. to 4:30 p.m. Monday - Friday. Please note that voicemails left after 4:00 p.m. may not be returned until the following business day.  We are closed weekends and major holidays. You have access to a nurse at all times for urgent questions. Please call the main number to the clinic Dept: [phone] and follow the prompts.   For any non-urgent questions, you may also contact your provider using MyChart. We now offer e-Visits for anyone 63 and older to request care online for non-urgent symptoms. For details visit [email or web address].   Also download the MyChart app! Go to the app store, search "MyChart", open the app, select [place], and log in with your MyChart username and password.

## 2023-05-20 ENCOUNTER — Inpatient Hospital Stay: Payer: Medicare Other

## 2023-05-20 VITALS — BP 99/57 | HR 95 | Temp 97.7°F | Resp 17

## 2023-05-20 DIAGNOSIS — Z5189 Encounter for other specified aftercare: Secondary | ICD-10-CM | POA: Diagnosis not present

## 2023-05-20 DIAGNOSIS — Z5111 Encounter for antineoplastic chemotherapy: Secondary | ICD-10-CM | POA: Diagnosis not present

## 2023-05-20 DIAGNOSIS — C7951 Secondary malignant neoplasm of bone: Secondary | ICD-10-CM | POA: Diagnosis not present

## 2023-05-20 DIAGNOSIS — C3492 Malignant neoplasm of unspecified part of left bronchus or lung: Secondary | ICD-10-CM

## 2023-05-20 DIAGNOSIS — C3432 Malignant neoplasm of lower lobe, left bronchus or lung: Secondary | ICD-10-CM | POA: Diagnosis not present

## 2023-05-20 MED ORDER — PEGFILGRASTIM-CBQV 6 MG/0.6ML ~~LOC~~ SOSY
6.0000 mg | PREFILLED_SYRINGE | Freq: Once | SUBCUTANEOUS | Status: AC
  Administered 2023-05-20: 6 mg via SUBCUTANEOUS
  Filled 2023-05-20: qty 0.6

## 2023-05-25 ENCOUNTER — Inpatient Hospital Stay: Payer: Medicare Other

## 2023-05-25 DIAGNOSIS — C3432 Malignant neoplasm of lower lobe, left bronchus or lung: Secondary | ICD-10-CM | POA: Diagnosis not present

## 2023-05-25 DIAGNOSIS — Z5189 Encounter for other specified aftercare: Secondary | ICD-10-CM | POA: Diagnosis not present

## 2023-05-25 DIAGNOSIS — Z5111 Encounter for antineoplastic chemotherapy: Secondary | ICD-10-CM | POA: Diagnosis not present

## 2023-05-25 DIAGNOSIS — Z95828 Presence of other vascular implants and grafts: Secondary | ICD-10-CM

## 2023-05-25 DIAGNOSIS — C3492 Malignant neoplasm of unspecified part of left bronchus or lung: Secondary | ICD-10-CM

## 2023-05-25 DIAGNOSIS — C7951 Secondary malignant neoplasm of bone: Secondary | ICD-10-CM | POA: Diagnosis not present

## 2023-05-25 LAB — CBC WITH DIFFERENTIAL (CANCER CENTER ONLY)
Abs Immature Granulocytes: 0.08 10*3/uL — ABNORMAL HIGH (ref 0.00–0.07)
Basophils Absolute: 0.1 10*3/uL (ref 0.0–0.1)
Basophils Relative: 1 %
Eosinophils Absolute: 0.1 10*3/uL (ref 0.0–0.5)
Eosinophils Relative: 2 %
HCT: 28.2 % — ABNORMAL LOW (ref 39.0–52.0)
Hemoglobin: 9.3 g/dL — ABNORMAL LOW (ref 13.0–17.0)
Immature Granulocytes: 2 %
Lymphocytes Relative: 12 %
Lymphs Abs: 0.5 10*3/uL — ABNORMAL LOW (ref 0.7–4.0)
MCH: 29.9 pg (ref 26.0–34.0)
MCHC: 33 g/dL (ref 30.0–36.0)
MCV: 90.7 fL (ref 80.0–100.0)
Monocytes Absolute: 0.5 10*3/uL (ref 0.1–1.0)
Monocytes Relative: 12 %
Neutro Abs: 3 10*3/uL (ref 1.7–7.7)
Neutrophils Relative %: 71 %
Platelet Count: 97 10*3/uL — ABNORMAL LOW (ref 150–400)
RBC: 3.11 MIL/uL — ABNORMAL LOW (ref 4.22–5.81)
RDW: 18 % — ABNORMAL HIGH (ref 11.5–15.5)
WBC Count: 4.3 10*3/uL (ref 4.0–10.5)
nRBC: 0 % (ref 0.0–0.2)

## 2023-05-25 LAB — CMP (CANCER CENTER ONLY)
ALT: 9 U/L (ref 0–44)
AST: 14 U/L — ABNORMAL LOW (ref 15–41)
Albumin: 3.4 g/dL — ABNORMAL LOW (ref 3.5–5.0)
Alkaline Phosphatase: 112 U/L (ref 38–126)
Anion gap: 7 (ref 5–15)
BUN: 19 mg/dL (ref 8–23)
CO2: 23 mmol/L (ref 22–32)
Calcium: 9.5 mg/dL (ref 8.9–10.3)
Chloride: 101 mmol/L (ref 98–111)
Creatinine: 1 mg/dL (ref 0.61–1.24)
GFR, Estimated: >60 mL/min (ref >60)
Glucose, Bld: 89 mg/dL (ref 70–99)
Potassium: 4.3 mmol/L (ref 3.5–5.1)
Sodium: 131 mmol/L — ABNORMAL LOW (ref 135–145)
Total Bilirubin: 0.4 mg/dL (ref 0.0–1.2)
Total Protein: 6.7 g/dL (ref 6.5–8.1)

## 2023-05-25 MED ORDER — HEPARIN SOD (PORK) LOCK FLUSH 100 UNIT/ML IV SOLN
500.0000 [IU] | Freq: Once | INTRAVENOUS | Status: AC
  Administered 2023-05-25: 500 [IU]

## 2023-05-25 MED ORDER — SODIUM CHLORIDE 0.9% FLUSH
10.0000 mL | Freq: Once | INTRAVENOUS | Status: AC
  Administered 2023-05-25: 10 mL

## 2023-05-29 ENCOUNTER — Telehealth: Payer: Self-pay | Admitting: Medical Oncology

## 2023-05-29 DIAGNOSIS — B029 Zoster without complications: Secondary | ICD-10-CM | POA: Diagnosis not present

## 2023-05-29 DIAGNOSIS — Z681 Body mass index (BMI) 19 or less, adult: Secondary | ICD-10-CM | POA: Diagnosis not present

## 2023-05-29 NOTE — Telephone Encounter (Signed)
Per Roanna Raider, I told her to keep CT appt next week and to tell the CT tech that he is being treated for shingles and they will take precautions. Wife had chicken pox as a child and I told her to maintain hygiene and if lesions are weeping do not touch them.   Use vasoline per PCP recommendations.

## 2023-06-01 ENCOUNTER — Ambulatory Visit (HOSPITAL_COMMUNITY)
Admission: RE | Admit: 2023-06-01 | Discharge: 2023-06-01 | Disposition: A | Discharge status: Home / Self Care | Payer: Medicare Other | Source: Ambulatory Visit | Attending: Internal Medicine | Admitting: Internal Medicine

## 2023-06-01 ENCOUNTER — Encounter (HOSPITAL_COMMUNITY): Payer: Self-pay

## 2023-06-01 ENCOUNTER — Other Ambulatory Visit: Payer: Self-pay | Admitting: Internal Medicine

## 2023-06-01 DIAGNOSIS — C3490 Malignant neoplasm of unspecified part of unspecified bronchus or lung: Secondary | ICD-10-CM

## 2023-06-01 DIAGNOSIS — I77810 Thoracic aortic ectasia: Secondary | ICD-10-CM | POA: Diagnosis not present

## 2023-06-01 DIAGNOSIS — N200 Calculus of kidney: Secondary | ICD-10-CM | POA: Diagnosis not present

## 2023-06-01 DIAGNOSIS — K5730 Diverticulosis of large intestine without perforation or abscess without bleeding: Secondary | ICD-10-CM | POA: Diagnosis not present

## 2023-06-01 MED ORDER — IOHEXOL 300 MG/ML  SOLN
100.0000 mL | Freq: Once | INTRAMUSCULAR | Status: AC | PRN
Start: 2023-06-01 — End: 2023-06-01
  Administered 2023-06-01: 100 mL via INTRAVENOUS

## 2023-06-05 MED FILL — Fosaprepitant Dimeglumine For IV Infusion 150 MG (Base Eq): INTRAVENOUS | Qty: 5 | Status: AC

## 2023-06-05 NOTE — Progress Notes (Signed)
 [facility] OFFICE PROGRESS NOTE  [name], PA-C [address] [place] [address]  DIAGNOSIS: Stage IV (T3, N1, M1b) non-small cell lung cancer favoring squamous cell carcinoma presented with superior segment left lower lobe lung mass with invasion of the chest wall and destruction of the posterior left sixth and seventh ribs as well as left hilar adenopathy and suspicious pleural metastasis diagnosed in March 2023   PRIOR THERAPY: 1) Palliative radiotherapy to the left lower lobe lung mass with chest wall invasion under the care of Dr. Roselind Messier. 2) Palliative systemic chemotherapy with carboplatin for AUC of 5, paclitaxel 175 Mg/M2 and Libtayo (Cempilimab) 350 Mg IV every 3 weeks with Neulasta support.  First dose was given on July 18, 2021.  Status post 4 cycles. 3) Maintenance treatment with Libtayo (Cempilimab) 350 Mg IV every 3 weeks, first dose October 10, 2021 status post 3 cycles.  Discontinued secondary to intolerance with immunotherapy mediated colitis and persistent diarrhea. 4) status post palliative radiotherapy to the progressive disease in the right adrenal gland as well as the left chest under the care of Dr. Roselind Messier completed October 24, 2022  CURRENT THERAPY: Systemic chemotherapy with carboplatin for AUC of 5 and paclitaxel 175 Mg/M2 every 3 weeks with Neulasta support first dose April 06, 2023. Status post 3 cycles.   INTERVAL HISTORY: Edward Beck 78 y.o. male returns to the clinic today for a follow up visit accompanied by his wife. The patient was last seen in the clinic by Dr. Arbutus Ped on 05/18/23. He is currently undergoing chemotherapy. He is status post 3 cycles and has tolerated it fairly well.  At last appointment, the patient did not have any concerning complaints except for occasional cough which produces white phlegm and runny nose in the morning.  This is stable. He also struggles with poor appetite due to dry mouth/taste alterations and  weight loss. He only eats once meal per day, 2 ensure, and a few smaller meals.   He called the clinic in the interval letting us know that he recently had shingles. He believes he was found to have shingles on 05/29/23. He completed his valtrex. Overall, the skin lesions are improving and scabbing over. The shingles is ont he right shoulder, scalp, and neck.  Today he denies any fever, chills, or night sweats. He stays cold at baseline. He denies any shortness of breath with exertion except for certain activities such as going up and down stairs.  He sometimes has tenderness near his Port-A-Cath site that feels sore if moving his right upper extremity a certain way.  He denies any nausea or vomiting.  The patient sometimes has intermittent diarrhea at baseline.  Overall he states that his diarrhea is "not too bad" and normal for him.  Denies any headache or visual changes.  He recently had a restaging CT scan performed.  He is here today for evaluation and repeat blood work before undergoing cycle #4.   MEDICAL HISTORY: Past Medical History:  Diagnosis Date   Condyloma acuminatum of scrotum    COVID 2021   December 2022 - mild   History of radiation therapy    Left Lung- 07/23/21-08/02/21- Dr. Antony Blackbird   History of radiation therapy    Left chest, right Kidney- 10/14/22-10/24/22- Dr. Antony Blackbird   Lung cancer [facility])    Pneumonia    Scrotal lesion     ALLERGIES:  is allergic to tamiflu [oseltamivir phosphate].  MEDICATIONS:  Current Outpatient Medications  Medication Sig Dispense Refill   benzonatate (TESSALON) 100 MG capsule Take 1 capsule (100 mg total) by mouth 3 (three) times daily as needed. 30 capsule 2   Cyanocobalamin (B-12) 1000 MCG TABS Take 1 tablet by mouth daily.     diphenoxylate-atropine (LOMOTIL) 2.5-0.025 MG tablet TAKE 1 TABLET BY MOUTH FOUR TIMES DAILY AS NEEDED FOR DIARRHEA OR LOOSE STOOLS 30 tablet 1   finasteride (PROSCAR) 5 MG tablet Take 1 tablet (5 mg total)  by mouth daily. 90 tablet 3   gabapentin (NEURONTIN) 100 MG capsule Take 1 capsule (100 mg total) by mouth 3 (three) times daily. 90 capsule 2   lidocaine-prilocaine (EMLA) cream Apply to the Port-A-Cath site 30 minutes before chemotherapy. 30 g 0   mirtazapine (REMERON) 15 MG tablet Take 1 tablet (15 mg total) by mouth at bedtime. 30 tablet 2   omeprazole (PRILOSEC) 20 MG capsule Take 1 capsule (20 mg total) by mouth daily. 30 capsule 2   potassium chloride SA (KLOR-CON M) 20 MEQ tablet Take 1 tablet (20 mEq total) by mouth 2 (two) times daily. 14 tablet 0   predniSONE (DELTASONE) 10 MG tablet 6 tablet p.o. daily for 7 days followed by 5 tablet p.o. daily for 7 days followed by 4 tablet p.o. daily for 7 days followed by 2 tablets p.o. daily for 7 days followed by 1 tablet p.o. daily for 7 days. 150 tablet 0   prochlorperazine (COMPAZINE) 10 MG tablet Take 1 tablet (10 mg total) by mouth every 6 (six) hours as needed for nausea or vomiting. 30 tablet 0   prochlorperazine (COMPAZINE) 10 MG tablet Take 1 tablet (10 mg total) by mouth every 6 (six) hours as needed. 30 tablet 2   triamcinolone cream (KENALOG) 0.1 % Apply 1 application  topically 2 (two) times daily as needed (dry skin).     umeclidinium-vilanterol (ANORO ELLIPTA) 62.5-25 MCG/ACT AEPB Inhale 1 puff into the lungs daily. 7 each 0   No current facility-administered medications for this visit.   Facility-Administered Medications Ordered in Other Visits  Medication Dose Route Frequency Provider Last Rate Last Admin   heparin lock flush 100 unit/mL  500 Units Intravenous Once Glenola Wheat L, PA-C       sodium chloride flush (NS) 0.9 % injection 10 mL  10 mL Intravenous PRN Caide Campi L, PA-C        SURGICAL HISTORY:  Past Surgical History:  Procedure Laterality Date   BRONCHIAL BIOPSY  07/02/2021   Procedure: BRONCHIAL BIOPSIES;  Surgeon: Josephine Igo, DO;  Location: [facility];  Service: Pulmonary;;    BRONCHIAL BRUSHINGS  07/02/2021   Procedure: BRONCHIAL BRUSHINGS;  Surgeon: Josephine Igo, DO;  Location: [facility];  Service: Pulmonary;;   BRONCHIAL NEEDLE ASPIRATION BIOPSY  07/02/2021   Procedure: BRONCHIAL NEEDLE ASPIRATION BIOPSIES;  Surgeon: Josephine Igo, DO;  Location: [facility];  Service: Pulmonary;;   HERNIA REPAIR  2009   left inguinal   IR IMAGING GUIDED PORT INSERTION  07/16/2021   SCROTAL EXPLORATION  11/21/2011   Procedure: SCROTUM EXPLORATION;  Surgeon: Lindaann Slough, MD;  Location: [facility];  Service: Urology;  Laterality: N/A;  EXCISION, BIOPSY SCROTAL CONDYLOMA    VIDEO BRONCHOSCOPY WITH ENDOBRONCHIAL ULTRASOUND Bilateral 07/02/2021   Procedure: VIDEO BRONCHOSCOPY WITH ENDOBRONCHIAL ULTRASOUND;  Surgeon: Josephine Igo, DO;  Location: [facility];  Service: Pulmonary;  Laterality: Bilateral;    REVIEW OF SYSTEMS:   Constitutional: Positive for weight loss, appetite change, and fatigue. Negative for  chills and fever.  HENT: Positive for nasal congestion. Negative for mouth sores, nosebleeds, sore throat and trouble swallowing.   Eyes: Negative for eye problems and icterus.  Respiratory: Positive for stable intermittent cough. Stable intermittent shortness of breath with exertion depending on the activity. Negative for hemoptysis and wheezing.   Cardiovascular: Negative for chest pain and leg swelling.  Gastrointestinal: Negative for abdominal pain, constipation, diarrhea, nausea and vomiting.  Genitourinary: Negative for bladder incontinence, difficulty urinating, dysuria, frequency and hematuria.   Musculoskeletal: Negative for back pain, gait problem, neck pain and neck stiffness.  Skin: Positive for rash on right back, neck, and scalp.  Neurological: Negative for dizziness, extremity weakness, gait problem, headaches, light-headedness and seizures.  Hematological: Negative for adenopathy. Does not bruise/bleed easily.  Psychiatric/Behavioral: Negative for  confusion, depression and sleep disturbance. The patient is not nervous/anxious.     PHYSICAL EXAMINATION:  There were no vitals taken for this visit.  ECOG PERFORMANCE STATUS: 1  Physical Exam  Constitutional: Oriented to person, place, and time and thin appearing male and in no distress.   HENT:  Head: Normocephalic and atraumatic.  Mouth/Throat: Oropharynx is clear and moist. No oropharyngeal exudate.  Eyes: Conjunctivae are normal. Right eye exhibits no discharge. Left eye exhibits no discharge. No scleral icterus.  Neck: Normal range of motion. Neck supple.  Cardiovascular: Normal rate, regular rhythm, normal heart sounds and intact distal pulses.   Pulmonary/Chest: Effort normal and breath sounds normal. No respiratory distress. No wheezes. No rales.  Abdominal: Soft. Bowel sounds are normal. Exhibits no distension and no mass. There is no tenderness.  Musculoskeletal: Normal range of motion. Exhibits no edema.  Lymphadenopathy:    No cervical adenopathy.  Neurological: Alert and oriented to person, place, and time. Exhibits muscle wasting. Gait normal and coordination normal.  Skin: Skin is warm and dry. Shingles noted on right scalp, neck, and back.  Not diaphoretic. No erythema. No pallor.  Psychiatric: Mood, memory and judgment normal.  Vitals reviewed. Psychiatric: Mood, memory and judgment normal.  Vitals reviewed.  LABORATORY DATA: Lab Results  Component Value Date   WBC 4.3 05/25/2023   HGB 9.3 (L) 05/25/2023   HCT 28.2 (L) 05/25/2023   MCV 90.7 05/25/2023   PLT 97 (L) 05/25/2023      Chemistry      Component Value Date/Time   NA 131 (L) 05/25/2023 1355   K 4.3 05/25/2023 1355   CL 101 05/25/2023 1355   CO2 23 05/25/2023 1355   BUN 19 05/25/2023 1355   CREATININE 1.00 05/25/2023 1355      Component Value Date/Time   CALCIUM 9.5 05/25/2023 1355   ALKPHOS 112 05/25/2023 1355   AST 14 (L) 05/25/2023 1355   ALT 9 05/25/2023 1355   BILITOT 0.4 05/25/2023  1355       RADIOGRAPHIC STUDIES:  No results found.   ASSESSMENT/PLAN:  This is a very pleasant 78 year old Caucasian male with stage IV (T3, N1, M1 B) non-small cell lung cancer, favoring squamous cell carcinoma.  The patient presented with a superior segment left upper lobe lung mass with invasion of the chest and destruction of the posterior left sixth and seventh ribs as well as left hilar adenopathy.  The patient has suspicious pleural metastases.  The patient was diagnosed in March 2023.    The patient underwent palliative radiation to left lower lobe lung mass and chest wall lesion under the care of Dr. Roselind Messier.   He then underwent systemic chemotherapy with carboplatin  for an AUC 5, paclitaxel 175 mg per metered squared, Libtayo 350 mg IV every 3 weeks.  He completed 4 cycles this was started on 07/18/2021.   He then started maintenance treatment with single agent Libtayo 350 mg IV every 3 weeks starting 10/10/2021.  Cycle 3 was delayed due to significant fatigue and weakness. This was discontinued due to suspicious colitis and diarrhea.   He is also status post palliative radiotherapy to the progressive disease in the right adrenal gland as well as the left chest under the care of Dr. Roselind Messier completed October 24, 2022.     The scan showed progression with progressive RML mass and LLL/posterior perihilar lesion suggesting additional site of recurrence in December.    Dr. Arbutus Ped recommends assuming his systemic treatment.  Since the patient had significant intolerance to immunotherapy in the past with colitis, Dr. Arbutus Ped is going to recommend resuming chemotherapy with carboplatin for an AUC of 5 and paclitaxel 175 mg/m for 4-6 cycles.  If he has good tolerance we may go up to 6 cycles. He is status post 3 cycles.   The patient was seen with Dr. Arbutus Ped today.  Dr. Arbutus Ped personally and independently reviewed the scan and discussed results with the patient today.  The scan showed right  sided lung mass which is stable to slightly larger, similar left hilar soft tissue mass.  The scan also showed new or developing cystic lesion.  To the tail of the pancreas which could be exophytic pancreatic lesion based on the appearance.  Dr. Arbutus Ped recommended referral to gastroenterology for further evaluation of the pancreatic lesion  Given the shingles, we will defer his treatment by 1 week to allow more recover time.   We will see him back for follow-up visit in 1 weeks for evaluation repeat blood work before undergoing cycle #4  Has decreased appetite and dry mouth, he will continue drinking protein supplemental drinks.  We also talked about Biotene and salt water rinses to help with this taste alterations.  We also discussed purchasing 1 of those large water bottles with the times listed on the side to hold him accountable to ensure he is hydrating well at home.  We will continue to monitor his labs closely on a weekly basis.   The patient was advised to call immediately if she has any concerning symptoms in the interval. The patient voices understanding of current disease status and treatment options and is in agreement with the current care plan. All questions were answered. The patient knows to call the clinic with any problems, questions or concerns. We can certainly see the patient much sooner if necessary  No orders of the defined types were placed in this encounter.    Elfego Giammarino L Deziyah Arvin, PA-C 06/05/23  ADDENDUM: Hematology/Oncology Attending: I had a face-to-face encounter with the patient today.  I reviewed his record, lab, scan and recommended his care plan.  This is a very pleasant 78 years old white male with stage IV non-small cell lung cancer favoring squamous cell carcinoma that was initially diagnosed in March 2023 status post palliative radiotherapy to the left lower lobe lung mass in addition to systemic chemotherapy with carboplatin, paclitaxel and Libtayo  (Cempilimab) for 4 cycles followed by maintenance treatment with Libtayo (Cempilimab) for 3 more cycle before it was discontinued secondary to immunotherapy mediated colitis and diarrhea.  The patient had evidence for disease recurrence and he was treated with palliative radiotherapy to progressive disease in the right adrenal gland in July  2024.  He has additional disease progression and we started him on systemic chemotherapy again with carboplatin for AUC of 5 and paclitaxel 175 Mg/M2 with Neulasta support status post 3 cycles.  He has been tolerating this treatment well except for fatigue.  He was recently diagnosed with herpes zoster of the scalp and currently on treatment with Valtrex.  The patient had repeat CT scan of the chest, abdomen and pelvis performed recently.  I personally and independently reviewed the scan and discussed the result with the patient and his wife.  His scan showed the right-sided infrahilar lung mass measuring slightly larger but there is less mass effect along the adjacent middle lobe bronchus.  He has no other evidence of metastatic disease but there was new or developing cystic lesion immediately superior to the tail of the pancreas that could be an exophytic pancreatic lesion based on the appearance. I recommended for the patient to continue his current treatment with carboplatin and paclitaxel every 3 weeks for 3 more cycles but we will delay the start of cycle number 4 x 1 week until resolution of his herpes zoster and also to make sure the patient is feeling better before resuming the treatment. Regarding the exophytic pancreatic lesion, we will refer him to gastroenterology for evaluation. He will come back for follow-up visit next week for evaluation before resuming his treatment. The patient was advised to call immediately if he has any other concerning symptoms in the interval. The total time spent in the appointment was 30 minutes. Disclaimer: This note was dictated  with voice recognition software. Similar sounding words can inadvertently be transcribed and may be missed upon review. Lajuana Matte, MD

## 2023-06-08 ENCOUNTER — Other Ambulatory Visit: Payer: Self-pay | Admitting: Physician Assistant

## 2023-06-08 ENCOUNTER — Inpatient Hospital Stay (HOSPITAL_BASED_OUTPATIENT_CLINIC_OR_DEPARTMENT_OTHER): Payer: Medicare Other | Admitting: Physician Assistant

## 2023-06-08 ENCOUNTER — Inpatient Hospital Stay: Payer: Medicare Other | Attending: Internal Medicine

## 2023-06-08 ENCOUNTER — Telehealth: Payer: Self-pay | Admitting: Internal Medicine

## 2023-06-08 ENCOUNTER — Inpatient Hospital Stay: Payer: Medicare Other

## 2023-06-08 VITALS — BP 95/66 | HR 98 | Temp 97.4°F | Resp 16 | Wt 123.5 lb

## 2023-06-08 DIAGNOSIS — B029 Zoster without complications: Secondary | ICD-10-CM | POA: Insufficient documentation

## 2023-06-08 DIAGNOSIS — C782 Secondary malignant neoplasm of pleura: Secondary | ICD-10-CM | POA: Diagnosis not present

## 2023-06-08 DIAGNOSIS — Z5189 Encounter for other specified aftercare: Secondary | ICD-10-CM | POA: Diagnosis not present

## 2023-06-08 DIAGNOSIS — C7951 Secondary malignant neoplasm of bone: Secondary | ICD-10-CM | POA: Insufficient documentation

## 2023-06-08 DIAGNOSIS — Z5111 Encounter for antineoplastic chemotherapy: Secondary | ICD-10-CM | POA: Diagnosis not present

## 2023-06-08 DIAGNOSIS — C3492 Malignant neoplasm of unspecified part of left bronchus or lung: Secondary | ICD-10-CM

## 2023-06-08 DIAGNOSIS — C7989 Secondary malignant neoplasm of other specified sites: Secondary | ICD-10-CM | POA: Diagnosis not present

## 2023-06-08 DIAGNOSIS — C3432 Malignant neoplasm of lower lobe, left bronchus or lung: Secondary | ICD-10-CM | POA: Insufficient documentation

## 2023-06-08 DIAGNOSIS — Z95828 Presence of other vascular implants and grafts: Secondary | ICD-10-CM

## 2023-06-08 LAB — CBC WITH DIFFERENTIAL (CANCER CENTER ONLY)
Abs Immature Granulocytes: 0.16 10*3/uL — ABNORMAL HIGH (ref 0.00–0.07)
Basophils Absolute: 0.1 10*3/uL (ref 0.0–0.1)
Basophils Relative: 1 %
Eosinophils Absolute: 0 10*3/uL (ref 0.0–0.5)
Eosinophils Relative: 0 %
HCT: 30 % — ABNORMAL LOW (ref 39.0–52.0)
Hemoglobin: 9.9 g/dL — ABNORMAL LOW (ref 13.0–17.0)
Immature Granulocytes: 2 %
Lymphocytes Relative: 6 %
Lymphs Abs: 0.5 10*3/uL — ABNORMAL LOW (ref 0.7–4.0)
MCH: 31.2 pg (ref 26.0–34.0)
MCHC: 33 g/dL (ref 30.0–36.0)
MCV: 94.6 fL (ref 80.0–100.0)
Monocytes Absolute: 0.9 10*3/uL (ref 0.1–1.0)
Monocytes Relative: 11 %
Neutro Abs: 6.9 10*3/uL (ref 1.7–7.7)
Neutrophils Relative %: 80 %
Platelet Count: 191 10*3/uL (ref 150–400)
RBC: 3.17 MIL/uL — ABNORMAL LOW (ref 4.22–5.81)
RDW: 19.4 % — ABNORMAL HIGH (ref 11.5–15.5)
WBC Count: 8.6 10*3/uL (ref 4.0–10.5)
nRBC: 0 % (ref 0.0–0.2)

## 2023-06-08 LAB — SAMPLE TO BLOOD BANK

## 2023-06-08 LAB — CMP (CANCER CENTER ONLY)
ALT: 6 U/L (ref 0–44)
AST: 14 U/L — ABNORMAL LOW (ref 15–41)
Albumin: 3.5 g/dL (ref 3.5–5.0)
Alkaline Phosphatase: 111 U/L (ref 38–126)
Anion gap: 6 (ref 5–15)
BUN: 18 mg/dL (ref 8–23)
CO2: 25 mmol/L (ref 22–32)
Calcium: 9.7 mg/dL (ref 8.9–10.3)
Chloride: 100 mmol/L (ref 98–111)
Creatinine: 1.05 mg/dL (ref 0.61–1.24)
GFR, Estimated: >60 mL/min (ref >60)
Glucose, Bld: 95 mg/dL (ref 70–99)
Potassium: 4.3 mmol/L (ref 3.5–5.1)
Sodium: 131 mmol/L — ABNORMAL LOW (ref 135–145)
Total Bilirubin: 0.4 mg/dL (ref 0.0–1.2)
Total Protein: 7.7 g/dL (ref 6.5–8.1)

## 2023-06-08 MED ORDER — SODIUM CHLORIDE 0.9% FLUSH
10.0000 mL | Freq: Once | INTRAVENOUS | Status: AC
  Administered 2023-06-08: 10 mL

## 2023-06-08 NOTE — Telephone Encounter (Signed)
 Scheduled appointments and the patient is aware .

## 2023-06-09 ENCOUNTER — Other Ambulatory Visit: Payer: Self-pay

## 2023-06-10 ENCOUNTER — Ambulatory Visit: Payer: Medicare Other

## 2023-06-15 ENCOUNTER — Inpatient Hospital Stay

## 2023-06-15 ENCOUNTER — Other Ambulatory Visit: Payer: Medicare Other

## 2023-06-15 ENCOUNTER — Other Ambulatory Visit: Payer: Self-pay

## 2023-06-15 ENCOUNTER — Inpatient Hospital Stay (HOSPITAL_BASED_OUTPATIENT_CLINIC_OR_DEPARTMENT_OTHER): Admitting: Internal Medicine

## 2023-06-15 VITALS — BP 90/60 | HR 92 | Temp 97.4°F | Resp 17 | Ht 73.5 in | Wt 124.2 lb

## 2023-06-15 DIAGNOSIS — C782 Secondary malignant neoplasm of pleura: Secondary | ICD-10-CM | POA: Diagnosis not present

## 2023-06-15 DIAGNOSIS — C7989 Secondary malignant neoplasm of other specified sites: Secondary | ICD-10-CM | POA: Diagnosis not present

## 2023-06-15 DIAGNOSIS — C3492 Malignant neoplasm of unspecified part of left bronchus or lung: Secondary | ICD-10-CM

## 2023-06-15 DIAGNOSIS — Z95828 Presence of other vascular implants and grafts: Secondary | ICD-10-CM

## 2023-06-15 DIAGNOSIS — Z5189 Encounter for other specified aftercare: Secondary | ICD-10-CM | POA: Diagnosis not present

## 2023-06-15 DIAGNOSIS — C7951 Secondary malignant neoplasm of bone: Secondary | ICD-10-CM | POA: Diagnosis not present

## 2023-06-15 DIAGNOSIS — Z5111 Encounter for antineoplastic chemotherapy: Secondary | ICD-10-CM | POA: Diagnosis not present

## 2023-06-15 DIAGNOSIS — C3432 Malignant neoplasm of lower lobe, left bronchus or lung: Secondary | ICD-10-CM | POA: Diagnosis not present

## 2023-06-15 LAB — CBC WITH DIFFERENTIAL (CANCER CENTER ONLY)
Abs Immature Granulocytes: 0.13 10*3/uL — ABNORMAL HIGH (ref 0.00–0.07)
Basophils Absolute: 0.1 10*3/uL (ref 0.0–0.1)
Basophils Relative: 1 %
Eosinophils Absolute: 0.2 10*3/uL (ref 0.0–0.5)
Eosinophils Relative: 2 %
HCT: 29.3 % — ABNORMAL LOW (ref 39.0–52.0)
Hemoglobin: 9.7 g/dL — ABNORMAL LOW (ref 13.0–17.0)
Immature Granulocytes: 1 %
Lymphocytes Relative: 6 %
Lymphs Abs: 0.5 10*3/uL — ABNORMAL LOW (ref 0.7–4.0)
MCH: 31.7 pg (ref 26.0–34.0)
MCHC: 33.1 g/dL (ref 30.0–36.0)
MCV: 95.8 fL (ref 80.0–100.0)
Monocytes Absolute: 0.7 10*3/uL (ref 0.1–1.0)
Monocytes Relative: 8 %
Neutro Abs: 7.4 10*3/uL (ref 1.7–7.7)
Neutrophils Relative %: 82 %
Platelet Count: 189 10*3/uL (ref 150–400)
RBC: 3.06 MIL/uL — ABNORMAL LOW (ref 4.22–5.81)
RDW: 19.9 % — ABNORMAL HIGH (ref 11.5–15.5)
WBC Count: 9 10*3/uL (ref 4.0–10.5)
nRBC: 0 % (ref 0.0–0.2)

## 2023-06-15 LAB — CMP (CANCER CENTER ONLY)
ALT: 7 U/L (ref 0–44)
AST: 14 U/L — ABNORMAL LOW (ref 15–41)
Albumin: 3.5 g/dL (ref 3.5–5.0)
Alkaline Phosphatase: 99 U/L (ref 38–126)
Anion gap: 4 — ABNORMAL LOW (ref 5–15)
BUN: 21 mg/dL (ref 8–23)
CO2: 28 mmol/L (ref 22–32)
Calcium: 9.7 mg/dL (ref 8.9–10.3)
Chloride: 99 mmol/L (ref 98–111)
Creatinine: 1.06 mg/dL (ref 0.61–1.24)
GFR, Estimated: >60 mL/min (ref >60)
Glucose, Bld: 92 mg/dL (ref 70–99)
Potassium: 4.3 mmol/L (ref 3.5–5.1)
Sodium: 131 mmol/L — ABNORMAL LOW (ref 135–145)
Total Bilirubin: 0.4 mg/dL (ref 0.0–1.2)
Total Protein: 7.7 g/dL (ref 6.5–8.1)

## 2023-06-15 MED ORDER — HEPARIN SOD (PORK) LOCK FLUSH 100 UNIT/ML IV SOLN
500.0000 [IU] | Freq: Once | INTRAVENOUS | Status: AC
  Administered 2023-06-15: 500 [IU]

## 2023-06-15 MED ORDER — SODIUM CHLORIDE 0.9% FLUSH
10.0000 mL | Freq: Once | INTRAVENOUS | Status: AC
Start: 2023-06-15 — End: 2023-06-15
  Administered 2023-06-15: 10 mL

## 2023-06-15 NOTE — Progress Notes (Signed)
 [facility] [phone]   [phone]  OFFICE PROGRESS NOTE  [name], PA-C [address] [place] [address]  DIAGNOSIS: Stage IV (T3, N1, M1b) non-small cell lung cancer favoring squamous cell carcinoma presented with superior segment left lower lobe lung mass with invasion of the chest wall and destruction of the posterior left sixth and seventh ribs as well as left hilar adenopathy and suspicious pleural metastasis diagnosed in March 2023  PRIOR THERAPY:  1) Palliative radiotherapy to the left lower lobe lung mass with chest wall invasion under the care of Dr. Roselind Messier. 2) Palliative systemic chemotherapy with carboplatin for AUC of 5, paclitaxel 175 Mg/M2 and Libtayo (Cempilimab) 350 Mg IV every 3 weeks with Neulasta support.  First dose was given on July 18, 2021.  Status post 4 cycles. 3) Maintenance treatment with Libtayo (Cempilimab) 350 Mg IV every 3 weeks, first dose October 10, 2021 status post 3 cycles.  Discontinued secondary to intolerance with immunotherapy mediated colitis and persistent diarrhea. 4) status post palliative radiotherapy to the progressive disease in the right adrenal gland as well as the left chest under the care of Dr. Roselind Messier completed October 24, 2022  CURRENT THERAPY: Systemic chemotherapy with carboplatin for AUC of 5 and paclitaxel 175 Mg/M2 every 3 weeks with Neulasta support first dose April 06, 2023.  Status post 3 cycles.  INTERVAL HISTORY:  Edward Beck 78 y.o. male returns to the clinic today for follow-up visit accompanied by his wife.Discussed the use of AI scribe software for clinical note transcription with the patient, who gave verbal consent to proceed.  History of Present Illness   The patient is a 78 year old with stage four non-small cell lung cancer who presents for chemotherapy treatment. He is accompanied by his wife.  He has stage four non-small cell lung cancer, specifically squamous  cell carcinoma, diagnosed in March 2023. Initially, he was treated with chemotherapy consisting of carboplatin, paclitaxel, and Libtayo for four cycles, followed by three cycles of maintenance Libtayo. [name] was discontinued due to severe diarrhea and colitis. The cancer began to progress, and he is currently receiving chemotherapy with carboplatin and paclitaxel every three weeks. He has completed three cycles and is about to start the fourth cycle today.  Recently, he experienced shingles on the back of his head, which delayed his chemotherapy treatment. He completed a seven-day course of Valtrex, taking one gram three times a day. The shingles initially worsened but have since improved. The shingles affected the nerves, causing discomfort in the back of his neck on the right side. He has been experiencing chest discomfort, which he associates with the shingles, and has been taking painkillers for relief.  He feels weaker and spends much of his time resting and sleeping. No current itching or burning in the area affected by shingles.       MEDICAL HISTORY: Past Medical History:  Diagnosis Date   Condyloma acuminatum of scrotum    COVID 2021   December 2022 - mild   History of radiation therapy    Left Lung- 07/23/21-08/02/21- Dr. Antony Blackbird   History of radiation therapy    Left chest, right Kidney- 10/14/22-10/24/22- Dr. Antony Blackbird   Lung cancer [facility])    Pneumonia    Scrotal lesion     ALLERGIES:  is allergic to tamiflu [oseltamivir phosphate].  MEDICATIONS:  Current Outpatient Medications  Medication Sig Dispense Refill   benzonatate (TESSALON) 100 MG capsule  Take 1 capsule (100 mg total) by mouth 3 (three) times daily as needed. 30 capsule 2   Cyanocobalamin (B-12) 1000 MCG TABS Take 1 tablet by mouth daily.     diphenoxylate-atropine (LOMOTIL) 2.5-0.025 MG tablet TAKE 1 TABLET BY MOUTH FOUR TIMES DAILY AS NEEDED FOR DIARRHEA OR LOOSE STOOLS 30 tablet 1   finasteride  (PROSCAR) 5 MG tablet Take 1 tablet (5 mg total) by mouth daily. 90 tablet 3   gabapentin (NEURONTIN) 100 MG capsule Take 1 capsule (100 mg total) by mouth 3 (three) times daily. 90 capsule 2   lidocaine-prilocaine (EMLA) cream Apply to the Port-A-Cath site 30 minutes before chemotherapy. 30 g 0   mirtazapine (REMERON) 15 MG tablet Take 1 tablet (15 mg total) by mouth at bedtime. 30 tablet 2   omeprazole (PRILOSEC) 20 MG capsule Take 1 capsule (20 mg total) by mouth daily. 30 capsule 2   potassium chloride SA (KLOR-CON M) 20 MEQ tablet Take 1 tablet (20 mEq total) by mouth 2 (two) times daily. 14 tablet 0   predniSONE (DELTASONE) 10 MG tablet 6 tablet p.o. daily for 7 days followed by 5 tablet p.o. daily for 7 days followed by 4 tablet p.o. daily for 7 days followed by 2 tablets p.o. daily for 7 days followed by 1 tablet p.o. daily for 7 days. 150 tablet 0   prochlorperazine (COMPAZINE) 10 MG tablet Take 1 tablet (10 mg total) by mouth every 6 (six) hours as needed for nausea or vomiting. 30 tablet 0   prochlorperazine (COMPAZINE) 10 MG tablet Take 1 tablet (10 mg total) by mouth every 6 (six) hours as needed. 30 tablet 2   triamcinolone cream (KENALOG) 0.1 % Apply 1 application  topically 2 (two) times daily as needed (dry skin).     umeclidinium-vilanterol (ANORO ELLIPTA) 62.5-25 MCG/ACT AEPB Inhale 1 puff into the lungs daily. 7 each 0   No current facility-administered medications for this visit.   Facility-Administered Medications Ordered in Other Visits  Medication Dose Route Frequency Provider Last Rate Last Admin   heparin lock flush 100 unit/mL  500 Units Intravenous Once Heilingoetter, Cassandra L, PA-C       sodium chloride flush (NS) 0.9 % injection 10 mL  10 mL Intravenous PRN Heilingoetter, Cassandra L, PA-C        SURGICAL HISTORY:  Past Surgical History:  Procedure Laterality Date   BRONCHIAL BIOPSY  07/02/2021   Procedure: BRONCHIAL BIOPSIES;  Surgeon: Josephine Igo, DO;   Location: [facility];  Service: Pulmonary;;   BRONCHIAL BRUSHINGS  07/02/2021   Procedure: BRONCHIAL BRUSHINGS;  Surgeon: Josephine Igo, DO;  Location: [facility];  Service: Pulmonary;;   BRONCHIAL NEEDLE ASPIRATION BIOPSY  07/02/2021   Procedure: BRONCHIAL NEEDLE ASPIRATION BIOPSIES;  Surgeon: Josephine Igo, DO;  Location: [facility];  Service: Pulmonary;;   HERNIA REPAIR  2009   left inguinal   IR IMAGING GUIDED PORT INSERTION  07/16/2021   SCROTAL EXPLORATION  11/21/2011   Procedure: SCROTUM EXPLORATION;  Surgeon: Lindaann Slough, MD;  Location: [facility];  Service: Urology;  Laterality: N/A;  EXCISION, BIOPSY SCROTAL CONDYLOMA    VIDEO BRONCHOSCOPY WITH ENDOBRONCHIAL ULTRASOUND Bilateral 07/02/2021   Procedure: VIDEO BRONCHOSCOPY WITH ENDOBRONCHIAL ULTRASOUND;  Surgeon: Josephine Igo, DO;  Location: [facility];  Service: Pulmonary;  Laterality: Bilateral;    REVIEW OF SYSTEMS:  Constitutional: positive for fatigue Eyes: negative Ears, nose, mouth, throat, and face: negative Respiratory: negative Cardiovascular: negative Gastrointestinal: negative Genitourinary:negative Integument/breast: positive for  dryness, pruritus, and rash Hematologic/lymphatic: negative Musculoskeletal:negative Neurological: negative Behavioral/Psych: negative Endocrine: negative Allergic/Immunologic: negative   PHYSICAL EXAMINATION: General appearance: alert, cooperative, fatigued, and no distress Head: Normocephalic, without obvious abnormality, atraumatic Neck: no adenopathy, no JVD, supple, symmetrical, trachea midline, and thyroid not enlarged, symmetric, no tenderness/mass/nodules Lymph nodes: Cervical, supraclavicular, and axillary nodes normal. Resp: clear to auscultation bilaterally Back: symmetric, no curvature. ROM normal. No CVA tenderness. Cardio: regular rate and rhythm, S1, S2 normal, no murmur, click, rub or gallop GI: soft, non-tender; bowel sounds normal; no masses,  no  organomegaly Extremities: extremities normal, atraumatic, no cyanosis or edema Neurologic: Alert and oriented X 3, normal strength and tone. Normal symmetric reflexes. Normal coordination and gait  ECOG PERFORMANCE STATUS: 1 - Symptomatic but completely ambulatory  Blood pressure 90/60, pulse 92, temperature (!) 97.4 F (36.3 C), temperature source Temporal, resp. rate 17, height 6' 1.5" (1.867 m), weight 124 lb 3.2 oz (56.3 kg), SpO2 100%.  LABORATORY DATA: Lab Results  Component Value Date   WBC 8.6 06/08/2023   HGB 9.9 (L) 06/08/2023   HCT 30.0 (L) 06/08/2023   MCV 94.6 06/08/2023   PLT 191 06/08/2023      Chemistry      Component Value Date/Time   NA 131 (L) 06/08/2023 0755   K 4.3 06/08/2023 0755   CL 100 06/08/2023 0755   CO2 25 06/08/2023 0755   BUN 18 06/08/2023 0755   CREATININE 1.05 06/08/2023 0755      Component Value Date/Time   CALCIUM 9.7 06/08/2023 0755   ALKPHOS 111 06/08/2023 0755   AST 14 (L) 06/08/2023 0755   ALT 6 06/08/2023 0755   BILITOT 0.4 06/08/2023 0755       RADIOGRAPHIC STUDIES: CT CHEST ABDOMEN PELVIS W CONTRAST Result Date: 06/05/2023 CLINICAL DATA:  Non small cell lung cancer EXAM: CT CHEST, ABDOMEN, AND PELVIS WITH CONTRAST TECHNIQUE: Multidetector CT imaging of the chest, abdomen and pelvis was performed following the standard protocol during bolus administration of intravenous contrast. RADIATION DOSE REDUCTION: This exam was performed according to the departmental dose-optimization program which includes automated exposure control, adjustment of the mA and/or kV according to patient size and/or use of iterative reconstruction technique. CONTRAST:  OMNIPAQUE IOHEXOL 300 MG/ML  SOLN COMPARISON:  Chest CT 03/02/2023, CAP CT 01/05/2023. FINDINGS: CT CHEST FINDINGS Cardiovascular: Right IJ chest port in place with tip along the central SVC above the right atrium. Heart is nonenlarged. No significant pericardial effusion. The thoracic aorta  has some scattered partially calcified atherosclerotic plaque. There is a bovine type aortic arch, normal variant. There is ectasia of the ascending aorta with diameter of 4.0 cm, similar to previous. Mediastinum/Nodes: Normal caliber thoracic esophagus. Preserved thyroid gland. No specific abnormal lymph node enlargement seen in the axillary region, right hilum or mediastinum. The left posterior hilar soft tissue mass is again identified. Previously measured 2.6 x 2.5 cm and today on series 2, image 26 2.8 x 2.2 cm. Some small nodes elsewhere along the left lung hilum. Lungs/Pleura: Advanced emphysematous lung changes identified. There is a mass seen medial to the right infrahilar region along the middle lobe. On the prior examination this measured 3.4 x 2.6 cm and today 4.2 x 2.7 cm, slightly larger. However the level of mass effect along the adjacent bronchus is decreased. Left stenosis. There are scarring atelectatic changes and pleural thickening posterior along the left lower lobe which are similar to previous. There are developing consolidative areas of opacity seen greatest in  the inferior middle lobe obscuring the previous areas of nodularity. There is also an area in the lingula as seen on series 4, image 72. This would have a differential including an acute infectious or inflammatory process. Recommend short follow-up and correlation with symptoms. The consolidative opacity along the left upper lobe with pleural thickening and distortion of the bronchi with bronchiectasis is stable. No pneumothorax or effusion. Musculoskeletal: Mild degenerative changes along the spine. CT ABDOMEN PELVIS FINDINGS Hepatobiliary: No focal liver abnormality is seen. No gallstones, gallbladder wall thickening, or biliary dilatation. Pancreas: Small cystic area seen above the pancreatic body today measures 15 x 13 mm. On prior this was barely visible. Otherwise preserved pancreatic parenchyma. Spleen: Normal in size without  focal abnormality. Adrenals/Urinary Tract: Adrenal glands are preserved. Mild bilateral renal atrophy. Benign appearing right-sided Bosniak 1 cysts are again seen. Bilateral nonobstructing renal stones. The ureters have normal course and caliber extending down to the urinary bladder. Bladder is underdistended. Stomach/Bowel: Large bowel has a normal course and caliber with scattered colonic stool.; Diverticulosis. Sh descending colon diverticulosis. The cecum is extending superior, posterior to the right hepatic lobe. Appendix is collapsed today. The stomach and small bowel are nondilated. Vascular/Lymphatic: Extensive atherosclerotic changes along the aorta and branch vessels. Areas of mild ectasia along the inferior abdominal aorta measuring up to 2.4 cm. No aneurysm formation. Preserved IVC. No discrete abnormal lymph node enlargement identified in the abdomen and pelvis. Areas of irregular atherosclerotic disease as well along the iliac vessels including some areas of plaque ulceration along the common femoral artery on the right. Reproductive: Prominent prostate with mass effect along the base of the bladder. Other: Mild anasarca. No free intra-abdominal air or free fluid. Breathing motion. Musculoskeletal: Degenerative changes seen of the spine and pelvis. Bridging osteophytes along the sacroiliac joints. IMPRESSION: Right-sided infrahilar lung mass a measures slightly larger today but there is less mass effect along the adjacent middle lobe bronchus. The left posterior hilar soft tissue mass appears similar when adjusted for technique. Stable opacity along the left upper lobe with distortion, pleural thickening and some loculated fluid. However there are developing consolidative lung changes in the middle lobe greater than lingula. Acute infiltrative process is possible. Please correlate with symptoms and recommend short follow-up. No developing lymph node enlargement. New or developing cystic lesion  immediately superior to the tail of the pancreas. This could be an exophytic pancreatic lesion based on appearance. With the change would recommend further evaluation such as MRCP to evaluate simplicity. Alternatively a short follow-up CT in 3 months could be considered. Colonic diverticulosis.  No bowel obstruction. Bilateral nonobstructing renal stones. Electronically Signed   By: Karen Kays M.D.   On: 06/05/2023 18:03    ASSESSMENT AND PLAN: This is a very pleasant 78 years old white male with Stage IV (T3, N1, M1b) non-small cell lung cancer favoring squamous cell carcinoma presented with superior segment left lower lobe lung mass with invasion of the chest wall and destruction of the posterior left sixth and seventh ribs as well as left hilar adenopathy and suspicious pleural metastasis diagnosed in March 2023 He is currently undergoing palliative radiotherapy to the left lower lobe lung mass with chest wall invasion under the care of Dr. Roselind Messier. He is also undergoing systemic chemotherapy with carboplatin for AUC of 5, paclitaxel 175 Mg/M2 and Libtayo (Cempilimab) 350 Mg IV every 3 weeks.  He started the first dose of his treatment on July 18, 2021.  Status post 4 cycles.  The patient started maintenance treatment with single agent Libtayo (Cempilimab) 350 Mg IV every 3 weeks on October 10, 2021 status post 3 cycles.  His treatment was discontinued secondary to immunotherapy mediated colitis and significant diarrhea which was treated with a tapered dose of prednisone and improved. He is also status post palliative radiotherapy to the progressive disease in the right adrenal gland as well as the left chest under the care of Dr. Roselind Messier completed October 24, 2022.  He had repeat CT scan of the chest, abdomen and pelvis performed recently.  I personally and independently reviewed the scan images and discussed the result and showed the images to the patient and his wife. His scan showed stable disease with  stable extensive posttreatment changes involving the left upper lobe with radiation fibrosis and bronchiectasis but there was new 1.4 cm right infrahilar lymph node concerning for possible disease recurrence. The patient is currently on systemic chemotherapy with carboplatin for AUC of 5 and paclitaxel 175 Mg/M2 with Neulasta support every 3 weeks.  First dose April 06, 2023.  Status post 3 cycles.  The patient has been tolerating this treatment fairly well with no concerning adverse effects.    Stage IV Non-Small Cell Lung Cancer (NSCLC) - Squamous Cell Carcinoma Diagnosed in March 2023. Initially treated with carboplatin, paclitaxel, and Libtayo for four cycles, followed by three cycles of maintenance Libtayo, discontinued due to severe diarrhea and colitis. Cancer progression noted; currently on carboplatin and paclitaxel every three weeks. Today is the fourth cycle. Patient tolerating treatment well. Discussed potential for shingles exacerbation with chemotherapy and need for vigilance. Patient prefers to continue treatment despite risks. - Administer fourth cycle of carboplatin and paclitaxel today - Monitor response to chemotherapy and adjust treatment as necessary  Herpes Zoster (Shingles) Developed shingles on the back of the head, delaying the last chemotherapy cycle. Completed a seven-day course of Valtrex (1 gram three times a day). Reports improvement but some residual symptoms persist. Discussed potential for shingles exacerbation with chemotherapy and need for vigilance. - Monitor for worsening of shingles symptoms - If symptoms worsen, restart Valtrex and notify primary care physician or oncology team  Chest Pain Intermittent chest pain likely related to shingles affecting the nerves. Pain improved as shingles symptoms improved. - Monitor chest pain in relation to shingles - Use pain management strategies as needed  Follow-up - Provide printed schedule of rescheduled  treatments - Ensure treatment schedule is available on MyChart.   The patient was advised to call immediately if he has any concerning symptoms in the interval. The patient voices understanding of current disease status and treatment options and is in agreement with the current care plan.  All questions were answered. The patient knows to call the clinic with any problems, questions or concerns. We can certainly see the patient much sooner if necessary.  The total time spent in the appointment was 30 minutes.  Disclaimer: This note was dictated with voice recognition software. Similar sounding words can inadvertently be transcribed and may not be corrected upon review.

## 2023-06-16 ENCOUNTER — Inpatient Hospital Stay

## 2023-06-16 VITALS — BP 100/65 | HR 99 | Temp 97.4°F | Resp 18 | Wt 124.0 lb

## 2023-06-16 DIAGNOSIS — C3432 Malignant neoplasm of lower lobe, left bronchus or lung: Secondary | ICD-10-CM | POA: Diagnosis not present

## 2023-06-16 DIAGNOSIS — Z5111 Encounter for antineoplastic chemotherapy: Secondary | ICD-10-CM | POA: Diagnosis not present

## 2023-06-16 DIAGNOSIS — C3492 Malignant neoplasm of unspecified part of left bronchus or lung: Secondary | ICD-10-CM

## 2023-06-16 DIAGNOSIS — Z5189 Encounter for other specified aftercare: Secondary | ICD-10-CM | POA: Diagnosis not present

## 2023-06-16 DIAGNOSIS — C7989 Secondary malignant neoplasm of other specified sites: Secondary | ICD-10-CM | POA: Diagnosis not present

## 2023-06-16 DIAGNOSIS — C782 Secondary malignant neoplasm of pleura: Secondary | ICD-10-CM | POA: Diagnosis not present

## 2023-06-16 DIAGNOSIS — C7951 Secondary malignant neoplasm of bone: Secondary | ICD-10-CM | POA: Diagnosis not present

## 2023-06-16 MED ORDER — SODIUM CHLORIDE 0.9 % IV SOLN: INTRAVENOUS | Status: DC

## 2023-06-16 MED ORDER — PALONOSETRON HCL INJECTION 0.25 MG/5ML
0.2500 mg | Freq: Once | INTRAVENOUS | Status: AC
  Administered 2023-06-16: 0.25 mg via INTRAVENOUS
  Filled 2023-06-16: qty 5

## 2023-06-16 MED ORDER — SODIUM CHLORIDE 0.9 % IV SOLN
319.5000 mg | Freq: Once | INTRAVENOUS | Status: AC
  Administered 2023-06-16: 320 mg via INTRAVENOUS
  Filled 2023-06-16: qty 32

## 2023-06-16 MED ORDER — HEPARIN SOD (PORK) LOCK FLUSH 100 UNIT/ML IV SOLN
500.0000 [IU] | Freq: Once | INTRAVENOUS | Status: AC | PRN
  Administered 2023-06-16: 500 [IU]

## 2023-06-16 MED ORDER — DIPHENHYDRAMINE HCL 50 MG/ML IJ SOLN
50.0000 mg | Freq: Once | INTRAMUSCULAR | Status: AC
  Administered 2023-06-16: 50 mg via INTRAVENOUS
  Filled 2023-06-16: qty 1

## 2023-06-16 MED ORDER — FAMOTIDINE IN NACL 20-0.9 MG/50ML-% IV SOLN
20.0000 mg | Freq: Once | INTRAVENOUS | Status: AC
  Administered 2023-06-16: 20 mg via INTRAVENOUS
  Filled 2023-06-16: qty 50

## 2023-06-16 MED ORDER — SODIUM CHLORIDE 0.9 % IV SOLN
150.0000 mg | Freq: Once | INTRAVENOUS | Status: AC
  Administered 2023-06-16: 150 mg via INTRAVENOUS
  Filled 2023-06-16: qty 150

## 2023-06-16 MED ORDER — SODIUM CHLORIDE 0.9 % IV SOLN
175.0000 mg/m2 | Freq: Once | INTRAVENOUS | Status: AC
  Administered 2023-06-16: 318 mg via INTRAVENOUS
  Filled 2023-06-16: qty 53

## 2023-06-16 MED ORDER — SODIUM CHLORIDE 0.9% FLUSH
10.0000 mL | INTRAVENOUS | Status: DC | PRN
  Administered 2023-06-16: 10 mL

## 2023-06-16 MED ORDER — DEXAMETHASONE SODIUM PHOSPHATE 10 MG/ML IJ SOLN
10.0000 mg | Freq: Once | INTRAMUSCULAR | Status: AC
  Administered 2023-06-16: 10 mg via INTRAVENOUS
  Filled 2023-06-16: qty 1

## 2023-06-16 NOTE — Progress Notes (Signed)

## 2023-06-16 NOTE — Patient Instructions (Signed)
 [facility] [place] - A DEPT OF [facility]  Discharge Instructions: Thank you for choosing [facility] to provide your oncology and hematology care.  If you have a lab appointment with the Cancer Center - please note that after April 8th, 2024, all labs will be drawn in the cancer center.  You do not have to check in or register with the main entrance as you have in the past but will complete your check-in in the cancer center.  Wear comfortable clothing and clothing appropriate for easy access to any Portacath or PICC line.   We strive to give you quality time with your provider. You may need to reschedule your appointment if you arrive late (15 or more minutes).  Arriving late affects you and other patients whose appointments are after yours.  Also, if you miss three or more appointments without notifying the office, you may be dismissed from the clinic at the provider's discretion.      For prescription refill requests, have your pharmacy contact our office and allow 72 hours for refills to be completed.    Today you received the following chemotherapy and/or immunotherapy agents Taxol and Carboplatin.       To help prevent nausea and vomiting after your treatment, we encourage you to take your nausea medication as directed.  BELOW ARE SYMPTOMS THAT SHOULD BE REPORTED IMMEDIATELY: *FEVER GREATER THAN 100.4 F (38 C) OR HIGHER *CHILLS OR SWEATING *NAUSEA AND VOMITING THAT IS NOT CONTROLLED WITH YOUR NAUSEA MEDICATION *UNUSUAL SHORTNESS OF BREATH *UNUSUAL BRUISING OR BLEEDING *URINARY PROBLEMS (pain or burning when urinating, or frequent urination) *BOWEL PROBLEMS (unusual diarrhea, constipation, pain near the anus) TENDERNESS IN MOUTH AND THROAT WITH OR WITHOUT PRESENCE OF ULCERS (sore throat, sores in mouth, or a toothache) UNUSUAL RASH, SWELLING OR PAIN  UNUSUAL VAGINAL DISCHARGE OR ITCHING   Items with * indicate a potential emergency and should  be followed up as soon as possible or go to the Emergency Department if any problems should occur.  Please show the CHEMOTHERAPY ALERT CARD or IMMUNOTHERAPY ALERT CARD at check-in to the Emergency Department and triage nurse.  Should you have questions after your visit or need to cancel or reschedule your appointment, please contact [facility] [place] - A DEPT OF [facility] [phone]  and follow the prompts.  Office hours are 8:00 a.m. to 4:30 p.m. Monday - Friday. Please note that voicemails left after 4:00 p.m. may not be returned until the following business day.  We are closed weekends and major holidays. You have access to a nurse at all times for urgent questions. Please call the main number to the clinic [phone] and follow the prompts.  For any non-urgent questions, you may also contact your provider using MyChart. We now offer e-Visits for anyone 61 and older to request care online for non-urgent symptoms. For details visit [email or web address].   Also download the MyChart app! Go to the app store, search "MyChart", open the app, select [place], and log in with your MyChart username and password.

## 2023-06-17 ENCOUNTER — Encounter: Payer: Self-pay | Admitting: Physician Assistant

## 2023-06-17 ENCOUNTER — Encounter: Payer: Self-pay | Admitting: Internal Medicine

## 2023-06-17 ENCOUNTER — Ambulatory Visit

## 2023-06-18 ENCOUNTER — Inpatient Hospital Stay

## 2023-06-18 VITALS — BP 93/63 | HR 100 | Temp 97.8°F | Resp 14

## 2023-06-18 DIAGNOSIS — C782 Secondary malignant neoplasm of pleura: Secondary | ICD-10-CM | POA: Diagnosis not present

## 2023-06-18 DIAGNOSIS — C7951 Secondary malignant neoplasm of bone: Secondary | ICD-10-CM | POA: Diagnosis not present

## 2023-06-18 DIAGNOSIS — Z5111 Encounter for antineoplastic chemotherapy: Secondary | ICD-10-CM | POA: Diagnosis not present

## 2023-06-18 DIAGNOSIS — Z5189 Encounter for other specified aftercare: Secondary | ICD-10-CM | POA: Diagnosis not present

## 2023-06-18 DIAGNOSIS — C3492 Malignant neoplasm of unspecified part of left bronchus or lung: Secondary | ICD-10-CM

## 2023-06-18 DIAGNOSIS — C3432 Malignant neoplasm of lower lobe, left bronchus or lung: Secondary | ICD-10-CM | POA: Diagnosis not present

## 2023-06-18 DIAGNOSIS — C7989 Secondary malignant neoplasm of other specified sites: Secondary | ICD-10-CM | POA: Diagnosis not present

## 2023-06-18 MED ORDER — PEGFILGRASTIM-CBQV 6 MG/0.6ML ~~LOC~~ SOSY
6.0000 mg | PREFILLED_SYRINGE | Freq: Once | SUBCUTANEOUS | Status: AC
  Administered 2023-06-18: 6 mg via SUBCUTANEOUS
  Filled 2023-06-18: qty 0.6

## 2023-06-22 ENCOUNTER — Other Ambulatory Visit: Payer: Medicare Other

## 2023-06-23 ENCOUNTER — Encounter: Payer: Self-pay | Admitting: Physician Assistant

## 2023-06-23 ENCOUNTER — Encounter: Payer: Self-pay | Admitting: Internal Medicine

## 2023-06-25 ENCOUNTER — Inpatient Hospital Stay

## 2023-06-25 ENCOUNTER — Other Ambulatory Visit: Payer: Self-pay | Admitting: Physician Assistant

## 2023-06-25 DIAGNOSIS — C7951 Secondary malignant neoplasm of bone: Secondary | ICD-10-CM | POA: Diagnosis not present

## 2023-06-25 DIAGNOSIS — C782 Secondary malignant neoplasm of pleura: Secondary | ICD-10-CM | POA: Diagnosis not present

## 2023-06-25 DIAGNOSIS — C3432 Malignant neoplasm of lower lobe, left bronchus or lung: Secondary | ICD-10-CM | POA: Diagnosis not present

## 2023-06-25 DIAGNOSIS — C7989 Secondary malignant neoplasm of other specified sites: Secondary | ICD-10-CM | POA: Diagnosis not present

## 2023-06-25 DIAGNOSIS — C3492 Malignant neoplasm of unspecified part of left bronchus or lung: Secondary | ICD-10-CM

## 2023-06-25 DIAGNOSIS — Z5111 Encounter for antineoplastic chemotherapy: Secondary | ICD-10-CM | POA: Diagnosis not present

## 2023-06-25 DIAGNOSIS — Z95828 Presence of other vascular implants and grafts: Secondary | ICD-10-CM

## 2023-06-25 DIAGNOSIS — Z5189 Encounter for other specified aftercare: Secondary | ICD-10-CM | POA: Diagnosis not present

## 2023-06-25 LAB — CBC WITH DIFFERENTIAL (CANCER CENTER ONLY)
Abs Immature Granulocytes: 0.83 10*3/uL — ABNORMAL HIGH (ref 0.00–0.07)
Basophils Absolute: 0.1 10*3/uL (ref 0.0–0.1)
Basophils Relative: 1 %
Eosinophils Absolute: 0.1 10*3/uL (ref 0.0–0.5)
Eosinophils Relative: 1 %
HCT: 27.2 % — ABNORMAL LOW (ref 39.0–52.0)
Hemoglobin: 9.1 g/dL — ABNORMAL LOW (ref 13.0–17.0)
Immature Granulocytes: 7 %
Lymphocytes Relative: 6 %
Lymphs Abs: 0.7 10*3/uL (ref 0.7–4.0)
MCH: 32.4 pg (ref 26.0–34.0)
MCHC: 33.5 g/dL (ref 30.0–36.0)
MCV: 96.8 fL (ref 80.0–100.0)
Monocytes Absolute: 1.4 10*3/uL — ABNORMAL HIGH (ref 0.1–1.0)
Monocytes Relative: 12 %
Neutro Abs: 8.2 10*3/uL — ABNORMAL HIGH (ref 1.7–7.7)
Neutrophils Relative %: 73 %
Platelet Count: 84 10*3/uL — ABNORMAL LOW (ref 150–400)
RBC: 2.81 MIL/uL — ABNORMAL LOW (ref 4.22–5.81)
RDW: 18 % — ABNORMAL HIGH (ref 11.5–15.5)
Smear Review: NORMAL
WBC Count: 11.2 10*3/uL — ABNORMAL HIGH (ref 4.0–10.5)
nRBC: 0 % (ref 0.0–0.2)

## 2023-06-25 LAB — CMP (CANCER CENTER ONLY)
ALT: 9 U/L (ref 0–44)
AST: 13 U/L — ABNORMAL LOW (ref 15–41)
Albumin: 3.3 g/dL — ABNORMAL LOW (ref 3.5–5.0)
Alkaline Phosphatase: 114 U/L (ref 38–126)
Anion gap: 4 — ABNORMAL LOW (ref 5–15)
BUN: 17 mg/dL (ref 8–23)
CO2: 30 mmol/L (ref 22–32)
Calcium: 9.8 mg/dL (ref 8.9–10.3)
Chloride: 98 mmol/L (ref 98–111)
Creatinine: 0.96 mg/dL (ref 0.61–1.24)
GFR, Estimated: >60 mL/min (ref >60)
Glucose, Bld: 108 mg/dL — ABNORMAL HIGH (ref 70–99)
Potassium: 4 mmol/L (ref 3.5–5.1)
Sodium: 132 mmol/L — ABNORMAL LOW (ref 135–145)
Total Bilirubin: 0.3 mg/dL (ref 0.0–1.2)
Total Protein: 7 g/dL (ref 6.5–8.1)

## 2023-06-25 MED ORDER — SODIUM CHLORIDE 0.9% FLUSH
10.0000 mL | Freq: Once | INTRAVENOUS | Status: AC
  Administered 2023-06-25: 10 mL

## 2023-06-25 MED ORDER — HEPARIN SOD (PORK) LOCK FLUSH 100 UNIT/ML IV SOLN
250.0000 [IU] | Freq: Once | INTRAVENOUS | Status: AC
  Administered 2023-06-25: 250 [IU]

## 2023-06-29 ENCOUNTER — Ambulatory Visit: Payer: Medicare Other

## 2023-06-29 ENCOUNTER — Ambulatory Visit: Payer: Medicare Other | Admitting: Physician Assistant

## 2023-06-29 ENCOUNTER — Other Ambulatory Visit: Payer: Medicare Other

## 2023-06-30 DIAGNOSIS — B029 Zoster without complications: Secondary | ICD-10-CM | POA: Diagnosis not present

## 2023-06-30 DIAGNOSIS — L0390 Cellulitis, unspecified: Secondary | ICD-10-CM | POA: Diagnosis not present

## 2023-06-30 DIAGNOSIS — Z681 Body mass index (BMI) 19 or less, adult: Secondary | ICD-10-CM | POA: Diagnosis not present

## 2023-07-01 ENCOUNTER — Ambulatory Visit: Payer: Medicare Other

## 2023-07-02 ENCOUNTER — Inpatient Hospital Stay

## 2023-07-02 DIAGNOSIS — Z95828 Presence of other vascular implants and grafts: Secondary | ICD-10-CM

## 2023-07-02 DIAGNOSIS — C782 Secondary malignant neoplasm of pleura: Secondary | ICD-10-CM | POA: Diagnosis not present

## 2023-07-02 DIAGNOSIS — Z5111 Encounter for antineoplastic chemotherapy: Secondary | ICD-10-CM | POA: Diagnosis not present

## 2023-07-02 DIAGNOSIS — C7951 Secondary malignant neoplasm of bone: Secondary | ICD-10-CM | POA: Diagnosis not present

## 2023-07-02 DIAGNOSIS — C3492 Malignant neoplasm of unspecified part of left bronchus or lung: Secondary | ICD-10-CM

## 2023-07-02 DIAGNOSIS — C7989 Secondary malignant neoplasm of other specified sites: Secondary | ICD-10-CM | POA: Diagnosis not present

## 2023-07-02 DIAGNOSIS — Z5189 Encounter for other specified aftercare: Secondary | ICD-10-CM | POA: Diagnosis not present

## 2023-07-02 DIAGNOSIS — C3432 Malignant neoplasm of lower lobe, left bronchus or lung: Secondary | ICD-10-CM | POA: Diagnosis not present

## 2023-07-02 LAB — CBC WITH DIFFERENTIAL (CANCER CENTER ONLY)
Abs Immature Granulocytes: 0.76 10*3/uL — ABNORMAL HIGH (ref 0.00–0.07)
Basophils Absolute: 0.1 10*3/uL (ref 0.0–0.1)
Basophils Relative: 1 %
Eosinophils Absolute: 0 10*3/uL (ref 0.0–0.5)
Eosinophils Relative: 0 %
HCT: 30.6 % — ABNORMAL LOW (ref 39.0–52.0)
Hemoglobin: 10 g/dL — ABNORMAL LOW (ref 13.0–17.0)
Immature Granulocytes: 5 %
Lymphocytes Relative: 4 %
Lymphs Abs: 0.7 10*3/uL (ref 0.7–4.0)
MCH: 32.2 pg (ref 26.0–34.0)
MCHC: 32.7 g/dL (ref 30.0–36.0)
MCV: 98.4 fL (ref 80.0–100.0)
Monocytes Absolute: 1 10*3/uL (ref 0.1–1.0)
Monocytes Relative: 6 %
Neutro Abs: 14.4 10*3/uL — ABNORMAL HIGH (ref 1.7–7.7)
Neutrophils Relative %: 84 %
Platelet Count: 145 10*3/uL — ABNORMAL LOW (ref 150–400)
RBC: 3.11 MIL/uL — ABNORMAL LOW (ref 4.22–5.81)
RDW: 17.9 % — ABNORMAL HIGH (ref 11.5–15.5)
WBC Count: 17.1 10*3/uL — ABNORMAL HIGH (ref 4.0–10.5)
nRBC: 0 % (ref 0.0–0.2)

## 2023-07-02 LAB — CMP (CANCER CENTER ONLY)
ALT: 7 U/L (ref 0–44)
AST: 13 U/L — ABNORMAL LOW (ref 15–41)
Albumin: 3.5 g/dL (ref 3.5–5.0)
Alkaline Phosphatase: 151 U/L — ABNORMAL HIGH (ref 38–126)
Anion gap: 3 — ABNORMAL LOW (ref 5–15)
BUN: 15 mg/dL (ref 8–23)
CO2: 29 mmol/L (ref 22–32)
Calcium: 9.6 mg/dL (ref 8.9–10.3)
Chloride: 99 mmol/L (ref 98–111)
Creatinine: 0.95 mg/dL (ref 0.61–1.24)
GFR, Estimated: >60 mL/min (ref >60)
Glucose, Bld: 93 mg/dL (ref 70–99)
Potassium: 4.6 mmol/L (ref 3.5–5.1)
Sodium: 131 mmol/L — ABNORMAL LOW (ref 135–145)
Total Bilirubin: 0.4 mg/dL (ref 0.0–1.2)
Total Protein: 7.5 g/dL (ref 6.5–8.1)

## 2023-07-02 LAB — SAMPLE TO BLOOD BANK

## 2023-07-02 MED ORDER — HEPARIN SOD (PORK) LOCK FLUSH 100 UNIT/ML IV SOLN
250.0000 [IU] | Freq: Once | INTRAVENOUS | Status: AC
  Administered 2023-07-02: 250 [IU]

## 2023-07-02 MED ORDER — SODIUM CHLORIDE 0.9% FLUSH
10.0000 mL | Freq: Once | INTRAVENOUS | Status: AC
  Administered 2023-07-02: 10 mL

## 2023-07-03 NOTE — Progress Notes (Signed)
 [facility] OFFICE PROGRESS NOTE  [name], PA-C [address] [place] [address]  DIAGNOSIS:  Stage IV (T3, N1, M1b) non-small cell lung cancer favoring squamous cell carcinoma presented with superior segment left lower lobe lung mass with invasion of the chest wall and destruction of the posterior left sixth and seventh ribs as well as left hilar adenopathy and suspicious pleural metastasis diagnosed in March 2023   PRIOR THERAPY: 1) Palliative radiotherapy to the left lower lobe lung mass with chest wall invasion under the care of Dr. Roselind Messier. 2) Palliative systemic chemotherapy with carboplatin for AUC of 5, paclitaxel 175 Mg/M2 and Libtayo (Cempilimab) 350 Mg IV every 3 weeks with Neulasta support.  First dose was given on July 18, 2021.  Status post 4 cycles. 3) Maintenance treatment with Libtayo (Cempilimab) 350 Mg IV every 3 weeks, first dose October 10, 2021 status post 3 cycles.  Discontinued secondary to intolerance with immunotherapy mediated colitis and persistent diarrhea. 4) status post palliative radiotherapy to the progressive disease in the right adrenal gland as well as the left chest under the care of Dr. Roselind Messier completed October 24, 2022  CURRENT THERAPY: Systemic chemotherapy with carboplatin for AUC of 5 and paclitaxel 175 Mg/M2 every 3 weeks with Neulasta support first dose April 06, 2023.  Status post 4 cycles.   INTERVAL HISTORY: Edward Beck 78 y.o. male returns to the clinic today for a follow-up visit accompanied by his wife.  The patient was last seen in the clinic on 06/15/2023 by Dr. Arbutus Ped.  At that time the patient was feeling fairly well but he had just been recovering from shingles.  His rash from shingles has improved on his scalp since I last saw the patient.  He also reported a little bit more fatigue and weakness.    Since last being seen, the patient is endorsing persistent gradual generalized weakness and fatigue.  He is also  lost 7 pounds since he was last seen.  He tries to drink protein supplemental drinks at least once per day sometimes twice.  He is not very active at home and he is cachectic appearing.  He denies any fever, chills, or night sweats.  He does report a dry mouth especially at nighttime.  He reports he drinks plenty of water.  He sometimes may have intermittent.  Shortness of breath such as going up and down stairs.  He does have a couple coughing spells throughout the day but not persistent.  He denies any sick contacts, sore throats, or significant nasal congestion.  He denies any significant chest congestion.  Denies any hemoptysis or chest pain.  He denies any nausea, vomiting, diarrhea, or constipation.  He denies any rashes or skin changes.  He denies any headache or visual changes.  A prior CT scan showed a cystic lesion near the pancreas for which she is supposed to follow-up with GI.  He got a call on Friday asking for clarification as he is previously seen [facility].  The patient is wanting to establish with [facility] due to being in the same EMR.  The patient takes Claritin daily for allergies and Mucinex.  He is here today for evaluation repeat blood work before undergoing cycle #5.     MEDICAL HISTORY: Past Medical History:  Diagnosis Date   Condyloma acuminatum of scrotum    COVID 2021   December 2022 - mild   History of radiation therapy    Left Lung- 07/23/21-08/02/21- Dr.  Antony Blackbird   History of radiation therapy    Left chest, right Kidney- 10/14/22-10/24/22- Dr. Antony Blackbird   Lung cancer [facility])    Pneumonia    Scrotal lesion     ALLERGIES:  is allergic to tamiflu [oseltamivir phosphate].  MEDICATIONS:  Current Outpatient Medications  Medication Sig Dispense Refill   Cyanocobalamin (B-12) 1000 MCG TABS Take 1 tablet by mouth daily.     finasteride (PROSCAR) 5 MG tablet Take 1 tablet (5 mg total) by mouth daily. 90 tablet 3   lidocaine-prilocaine (EMLA) cream Apply to the  Port-A-Cath site 30 minutes before chemotherapy. 30 g 0   methylPREDNISolone (MEDROL DOSEPAK) 4 MG TBPK tablet Use as instructed 21 tablet 0   prochlorperazine (COMPAZINE) 10 MG tablet Take 1 tablet (10 mg total) by mouth every 6 (six) hours as needed for nausea or vomiting. 30 tablet 0   triamcinolone cream (KENALOG) 0.1 % Apply 1 application  topically 2 (two) times daily as needed (dry skin).     umeclidinium-vilanterol (ANORO ELLIPTA) 62.5-25 MCG/ACT AEPB Inhale 1 puff into the lungs daily. 7 each 0   benzonatate (TESSALON) 100 MG capsule Take 1 capsule (100 mg total) by mouth 3 (three) times daily as needed. (Patient not taking: Reported on 07/07/2023) 30 capsule 2   diphenoxylate-atropine (LOMOTIL) 2.5-0.025 MG tablet TAKE 1 TABLET BY MOUTH FOUR TIMES DAILY AS NEEDED FOR DIARRHEA OR LOOSE STOOLS (Patient not taking: Reported on 07/07/2023) 30 tablet 1   gabapentin (NEURONTIN) 100 MG capsule Take 1 capsule (100 mg total) by mouth 3 (three) times daily. (Patient not taking: Reported on 07/07/2023) 90 capsule 2   mirtazapine (REMERON) 15 MG tablet Take 1 tablet (15 mg total) by mouth at bedtime. (Patient not taking: Reported on 07/07/2023) 30 tablet 2   omeprazole (PRILOSEC) 20 MG capsule Take 1 capsule (20 mg total) by mouth daily. (Patient not taking: Reported on 07/07/2023) 30 capsule 2   potassium chloride SA (KLOR-CON M) 20 MEQ tablet Take 1 tablet (20 mEq total) by mouth 2 (two) times daily. (Patient not taking: Reported on 07/07/2023) 14 tablet 0   Current Facility-Administered Medications  Medication Dose Route Frequency Provider Last Rate Last Admin   [START ON 07/08/2023] Zoledronic Acid (ZOMETA) IVPB 4 mg  4 mg Intravenous Once Si Gaul, MD       Facility-Administered Medications Ordered in Other Visits  Medication Dose Route Frequency Provider Last Rate Last Admin   heparin lock flush 100 unit/mL  500 Units Intravenous Once Pedro Oldenburg L, PA-C       sodium chloride flush (NS)  0.9 % injection 10 mL  10 mL Intravenous PRN Temitope Flammer L, PA-C        SURGICAL HISTORY:  Past Surgical History:  Procedure Laterality Date   BRONCHIAL BIOPSY  07/02/2021   Procedure: BRONCHIAL BIOPSIES;  Surgeon: Josephine Igo, DO;  Location: [facility];  Service: Pulmonary;;   BRONCHIAL BRUSHINGS  07/02/2021   Procedure: BRONCHIAL BRUSHINGS;  Surgeon: Josephine Igo, DO;  Location: [facility];  Service: Pulmonary;;   BRONCHIAL NEEDLE ASPIRATION BIOPSY  07/02/2021   Procedure: BRONCHIAL NEEDLE ASPIRATION BIOPSIES;  Surgeon: Josephine Igo, DO;  Location: [facility];  Service: Pulmonary;;   HERNIA REPAIR  2009   left inguinal   IR IMAGING GUIDED PORT INSERTION  07/16/2021   SCROTAL EXPLORATION  11/21/2011   Procedure: SCROTUM EXPLORATION;  Surgeon: Lindaann Slough, MD;  Location: [facility];  Service: Urology;  Laterality: N/A;  EXCISION, BIOPSY  SCROTAL CONDYLOMA    VIDEO BRONCHOSCOPY WITH ENDOBRONCHIAL ULTRASOUND Bilateral 07/02/2021   Procedure: VIDEO BRONCHOSCOPY WITH ENDOBRONCHIAL ULTRASOUND;  Surgeon: Josephine Igo, DO;  Location: [facility];  Service: Pulmonary;  Laterality: Bilateral;    REVIEW OF SYSTEMS:   Constitutional: Positive for weight loss, appetite change, and fatigue. Negative for chills and fever.  HENT: Positive for nasal congestion. Negative for mouth sores, nosebleeds, sore throat and trouble swallowing.   Eyes: Negative for eye problems and icterus.  Respiratory: Positive for stable intermittent cough. Stable intermittent shortness of breath with exertion depending on the activity. Negative for hemoptysis and wheezing.   Cardiovascular: Negative for chest pain and leg swelling.  Gastrointestinal: Negative for abdominal pain, constipation, diarrhea, nausea and vomiting.  Genitourinary: Negative for bladder incontinence, difficulty urinating, dysuria, frequency and hematuria.   Musculoskeletal: Negative for back pain, gait problem, neck pain  and neck stiffness.  Skin: Improved shingles.  Neurological: Negative for dizziness, extremity weakness, gait problem, headaches, light-headedness and seizures.  Hematological: Negative for adenopathy. Does not bruise/bleed easily.  Psychiatric/Behavioral: Negative for confusion, depression and sleep disturbance. The patient is not nervous/anxious.     PHYSICAL EXAMINATION:  Blood pressure 111/69, pulse 94, temperature 98 F (36.7 C), temperature source Temporal, resp. rate 17, height 6' 1.5" (1.867 m), weight 117 lb 9.6 oz (53.3 kg), SpO2 100%.  ECOG PERFORMANCE STATUS: 1  Physical Exam  Constitutional: Oriented to person, place, and time and thin appearing male and in no distress.   HENT:  Head: Normocephalic and atraumatic.  Mouth/Throat: Oropharynx is clear and moist. No oropharyngeal exudate.  Eyes: Conjunctivae are normal. Right eye exhibits no discharge. Left eye exhibits no discharge. No scleral icterus.  Neck: Normal range of motion. Neck supple.  Cardiovascular: Normal rate, regular rhythm, normal heart sounds and intact distal pulses.   Pulmonary/Chest: Effort normal and breath sounds normal. No respiratory distress. No wheezes. No rales.  Abdominal: Soft. Bowel sounds are normal. Exhibits no distension and no mass. There is no tenderness.  Musculoskeletal: Normal range of motion. Exhibits no edema.  Lymphadenopathy:    No cervical adenopathy.  Neurological: Alert and oriented to person, place, and time. Exhibits muscle wasting. Gait normal and coordination normal.  Skin: Skin is warm and dry. Shingles noted on right scalp, neck, and back.  Not diaphoretic. No erythema. No pallor.  Psychiatric: Mood, memory and judgment normal.  Vitals reviewed.Psychiatric: Mood, memory and judgment normal.  Vitals reviewed.  LABORATORY DATA: Lab Results  Component Value Date   WBC 12.0 (H) 07/07/2023   HGB 10.0 (L) 07/07/2023   HCT 29.8 (L) 07/07/2023   MCV 97.4 07/07/2023   PLT 141  (L) 07/07/2023      Chemistry      Component Value Date/Time   NA 128 (L) 07/07/2023 0949   K 4.2 07/07/2023 0949   CL 96 (L) 07/07/2023 0949   CO2 26 07/07/2023 0949   BUN 19 07/07/2023 0949   CREATININE 0.99 07/07/2023 0949      Component Value Date/Time   CALCIUM 9.5 07/07/2023 0949   ALKPHOS 148 (H) 07/07/2023 0949   AST 14 (L) 07/07/2023 0949   ALT 6 07/07/2023 0949   BILITOT 0.4 07/07/2023 0949       RADIOGRAPHIC STUDIES:  No results found.   ASSESSMENT/PLAN:  This is a very pleasant 77 year old Caucasian male with stage IV (T3, N1, M1 B) non-small cell lung cancer, favoring squamous cell carcinoma.  The patient presented with a superior segment left upper  lobe lung mass with invasion of the chest and destruction of the posterior left sixth and seventh ribs as well as left hilar adenopathy.  The patient has suspicious pleural metastases.  The patient was diagnosed in March 2023.    The patient underwent palliative radiation to left lower lobe lung mass and chest wall lesion under the care of Dr. Roselind Messier.   He then underwent systemic chemotherapy with carboplatin for an AUC 5, paclitaxel 175 mg per metered squared, Libtayo 350 mg IV every 3 weeks.  He completed 4 cycles this was started on 07/18/2021.    He then started maintenance treatment with single agent Libtayo 350 mg IV every 3 weeks starting 10/10/2021.  Cycle 3 was delayed due to significant fatigue and weakness. This was discontinued due to suspicious colitis and diarrhea.    He is also status post palliative radiotherapy to the progressive disease in the right adrenal gland as well as the left chest under the care of Dr. Roselind Messier completed October 24, 2022.     The scan showed progression with progressive RML mass and LLL/posterior perihilar lesion suggesting additional site of recurrence in December.     Dr. Arbutus Ped recommends assuming his systemic treatment.  Since the patient had significant intolerance to  immunotherapy in the past with colitis, Dr. Arbutus Ped is going to recommend resuming chemotherapy with carboplatin for an AUC of 5 and paclitaxel 175 mg/m for 4-6 cycles.  If he has good tolerance we may go up to 6 cycles. He is status post 4 cycles.   I reviewed the patient's treatment and doses with Dr. Arbutus Ped.  Dr. Arbutus Ped does not recommend a dose reduction at this time and recommends delaying treatment by 1 week for improvement in his symptoms.  I will give the patient a Medrol Dosepak to help with his fatigue and appetite.  The patient received a Medrol Dosepak in the past which did help his symptoms.  We will then reevaluate him next week before proceeding with cycle #5.  I offered antibiotics if the patient feels like he is having increased chest congestion.  The patient denies increased chest congestion, sinus concerns, or shortness of breath.  For the patient declined.  Patient was advised to salt water rinses and Biotene for his dry mouth.  He will continue to drink plenty of water.  The patient reports he does sleep with his mouth open which may be the reason why he wakes up with a dry mouth.  He was instructed to increase his protein supplemental drink intake to 2-3 times per day.  He will call GI back to schedule appointment for the pancreas lesion.     We will continue to monitor his labs closely on a weekly basis.   Patient was advised should he develop any signs or symptoms of infection in the interval to reach back out to [place] for re-evaluation  The patient was advised to call immediately if she has any concerning symptoms in the interval. The patient voices understanding of current disease status and treatment options and is in agreement with the current care plan. All questions were answered. The patient knows to call the clinic with any problems, questions or concerns. We can certainly see the patient much sooner if necessary  Orders Placed This Encounter  Procedures   CBC  with Differential (Cancer Center Only)    Standing Status:   Future    Expected Date:   07/14/2023    Expiration Date:   07/13/2024  CMP (Cancer Center only)    Standing Status:   Future    Expected Date:   07/14/2023    Expiration Date:   07/13/2024    The total time spent in the appointment was 20-29 minutes  Celestina Gironda L Luay Balding, PA-C 07/07/23

## 2023-07-06 ENCOUNTER — Other Ambulatory Visit

## 2023-07-06 ENCOUNTER — Ambulatory Visit: Admitting: Physician Assistant

## 2023-07-06 ENCOUNTER — Ambulatory Visit

## 2023-07-06 ENCOUNTER — Other Ambulatory Visit: Payer: Medicare Other

## 2023-07-06 MED FILL — Fosaprepitant Dimeglumine For IV Infusion 150 MG (Base Eq): INTRAVENOUS | Qty: 5 | Status: AC

## 2023-07-07 ENCOUNTER — Inpatient Hospital Stay

## 2023-07-07 ENCOUNTER — Inpatient Hospital Stay: Attending: Internal Medicine

## 2023-07-07 ENCOUNTER — Inpatient Hospital Stay (HOSPITAL_BASED_OUTPATIENT_CLINIC_OR_DEPARTMENT_OTHER): Admitting: Physician Assistant

## 2023-07-07 ENCOUNTER — Ambulatory Visit

## 2023-07-07 ENCOUNTER — Ambulatory Visit: Admitting: Physician Assistant

## 2023-07-07 ENCOUNTER — Other Ambulatory Visit

## 2023-07-07 VITALS — BP 111/69 | HR 94 | Temp 98.0°F | Resp 17 | Ht 73.5 in | Wt 117.6 lb

## 2023-07-07 DIAGNOSIS — Z95828 Presence of other vascular implants and grafts: Secondary | ICD-10-CM | POA: Diagnosis not present

## 2023-07-07 DIAGNOSIS — C782 Secondary malignant neoplasm of pleura: Secondary | ICD-10-CM | POA: Diagnosis not present

## 2023-07-07 DIAGNOSIS — Z5189 Encounter for other specified aftercare: Secondary | ICD-10-CM | POA: Diagnosis not present

## 2023-07-07 DIAGNOSIS — C7951 Secondary malignant neoplasm of bone: Secondary | ICD-10-CM | POA: Diagnosis not present

## 2023-07-07 DIAGNOSIS — C3492 Malignant neoplasm of unspecified part of left bronchus or lung: Secondary | ICD-10-CM

## 2023-07-07 DIAGNOSIS — Z5111 Encounter for antineoplastic chemotherapy: Secondary | ICD-10-CM | POA: Diagnosis not present

## 2023-07-07 DIAGNOSIS — R531 Weakness: Secondary | ICD-10-CM | POA: Insufficient documentation

## 2023-07-07 DIAGNOSIS — C3432 Malignant neoplasm of lower lobe, left bronchus or lung: Secondary | ICD-10-CM | POA: Diagnosis not present

## 2023-07-07 LAB — CBC WITH DIFFERENTIAL (CANCER CENTER ONLY)
Abs Immature Granulocytes: 0.17 10*3/uL — ABNORMAL HIGH (ref 0.00–0.07)
Basophils Absolute: 0.1 10*3/uL (ref 0.0–0.1)
Basophils Relative: 1 %
Eosinophils Absolute: 0 10*3/uL (ref 0.0–0.5)
Eosinophils Relative: 0 %
HCT: 29.8 % — ABNORMAL LOW (ref 39.0–52.0)
Hemoglobin: 10 g/dL — ABNORMAL LOW (ref 13.0–17.0)
Immature Granulocytes: 1 %
Lymphocytes Relative: 4 %
Lymphs Abs: 0.5 10*3/uL — ABNORMAL LOW (ref 0.7–4.0)
MCH: 32.7 pg (ref 26.0–34.0)
MCHC: 33.6 g/dL (ref 30.0–36.0)
MCV: 97.4 fL (ref 80.0–100.0)
Monocytes Absolute: 0.9 10*3/uL (ref 0.1–1.0)
Monocytes Relative: 8 %
Neutro Abs: 10.3 10*3/uL — ABNORMAL HIGH (ref 1.7–7.7)
Neutrophils Relative %: 86 %
Platelet Count: 141 10*3/uL — ABNORMAL LOW (ref 150–400)
RBC: 3.06 MIL/uL — ABNORMAL LOW (ref 4.22–5.81)
RDW: 17 % — ABNORMAL HIGH (ref 11.5–15.5)
WBC Count: 12 10*3/uL — ABNORMAL HIGH (ref 4.0–10.5)
nRBC: 0 % (ref 0.0–0.2)

## 2023-07-07 LAB — CMP (CANCER CENTER ONLY)
ALT: 6 U/L (ref 0–44)
AST: 14 U/L — ABNORMAL LOW (ref 15–41)
Albumin: 3.4 g/dL — ABNORMAL LOW (ref 3.5–5.0)
Alkaline Phosphatase: 148 U/L — ABNORMAL HIGH (ref 38–126)
Anion gap: 6 (ref 5–15)
BUN: 19 mg/dL (ref 8–23)
CO2: 26 mmol/L (ref 22–32)
Calcium: 9.5 mg/dL (ref 8.9–10.3)
Chloride: 96 mmol/L — ABNORMAL LOW (ref 98–111)
Creatinine: 0.99 mg/dL (ref 0.61–1.24)
GFR, Estimated: >60 mL/min (ref >60)
Glucose, Bld: 95 mg/dL (ref 70–99)
Potassium: 4.2 mmol/L (ref 3.5–5.1)
Sodium: 128 mmol/L — ABNORMAL LOW (ref 135–145)
Total Bilirubin: 0.4 mg/dL (ref 0.0–1.2)
Total Protein: 7.5 g/dL (ref 6.5–8.1)

## 2023-07-07 LAB — SAMPLE TO BLOOD BANK

## 2023-07-07 MED ORDER — METHYLPREDNISOLONE 4 MG PO TBPK: ORAL_TABLET | ORAL | 0 refills | Status: DC

## 2023-07-07 MED ORDER — SODIUM CHLORIDE 0.9% FLUSH
10.0000 mL | Freq: Once | INTRAVENOUS | Status: AC
  Administered 2023-07-07: 10 mL

## 2023-07-07 MED ORDER — ZOLEDRONIC ACID 4 MG/100ML IV SOLN: 4.0000 mg | Freq: Once | INTRAVENOUS | Status: DC

## 2023-07-07 MED ORDER — HEPARIN SOD (PORK) LOCK FLUSH 100 UNIT/ML IV SOLN
500.0000 [IU] | Freq: Once | INTRAVENOUS | Status: AC
  Administered 2023-07-07: 500 [IU]

## 2023-07-08 ENCOUNTER — Telehealth: Payer: Self-pay

## 2023-07-08 NOTE — Telephone Encounter (Signed)
 TC from pt's wife, stating that she forgot the phone number of [facility] who C. Heilingoetter, PA referred pt to in early March. Gave her the phone number [phone]) and instructed to call to see if pt could be set up for first appointment. Advised her to call Dr. Rebecca Eaton office if [facility] states they need another referral sent. She verbalizes understanding.

## 2023-07-09 ENCOUNTER — Ambulatory Visit

## 2023-07-09 ENCOUNTER — Inpatient Hospital Stay

## 2023-07-13 ENCOUNTER — Other Ambulatory Visit: Payer: Medicare Other

## 2023-07-14 NOTE — Progress Notes (Unsigned)
 [facility] OFFICE PROGRESS NOTE  [name], PA-C [address] [place] [address]  DIAGNOSIS: Stage IV (T3, N1, M1b) non-small cell lung cancer favoring squamous cell carcinoma presented with superior segment left lower lobe lung mass with invasion of the chest wall and destruction of the posterior left sixth and seventh ribs as well as left hilar adenopathy and suspicious pleural metastasis diagnosed in March 2023   PRIOR THERAPY: 1) Palliative radiotherapy to the left lower lobe lung mass with chest wall invasion under the care of Dr. Roselind Messier. 2) Palliative systemic chemotherapy with carboplatin for AUC of 5, paclitaxel 175 Mg/M2 and Libtayo (Cempilimab) 350 Mg IV every 3 weeks with Neulasta support.  First dose was given on July 18, 2021.  Status post 4 cycles. 3) Maintenance treatment with Libtayo (Cempilimab) 350 Mg IV every 3 weeks, first dose October 10, 2021 status post 3 cycles.  Discontinued secondary to intolerance with immunotherapy mediated colitis and persistent diarrhea. 4) status post palliative radiotherapy to the progressive disease in the right adrenal gland as well as the left chest under the care of Dr. Roselind Messier completed October 24, 2022  CURRENT THERAPY: Systemic chemotherapy with carboplatin for AUC of 5 and paclitaxel 175 Mg/M2 every 3 weeks with Neulasta support first dose April 06, 2023.  Status post 4 cycles.   INTERVAL HISTORY: Edward Beck 78 y.o. male returns to clinic today for follow-up visit accompanied by his wife.  The patient was last seen in the clinic last week by myself on 07/07/2023.  At that time the patient was feeling fair but he had had some generalized fatigue and weakness that was more challenging with the more cumulative doses of chemo.  He also had loss a lot of weight.  He was given an extra week to recover and given a Medrol Dosepak to help with his energy, fatigue, and appetite.  This has ***his symptoms.  ***Chest  congestion  Today he feels well enough to proceed with treatment.  Appetite and weight?  He still drinking protein supplemental drinks and his increase this to twice per day. He denies any fever, chills, or night sweats.  He does report a dry mouth especially at nighttime.  He reports he drinks plenty of water.  He sometimes may have intermittent.  Shortness of breath such as going up and down stairs.  He does have a couple coughing spells throughout the day but not persistent.  He denies any sick contacts, sore throats, or significant nasal congestion.  He denies any significant chest congestion.  Denies any hemoptysis or chest pain.  He denies any nausea, vomiting, diarrhea, or constipation.  He denies any rashes or skin changes.  He denies any headache or visual changes.  Prior CT scan showed a cystic lesion near the pancreas for which she was supposed to see GI.  He has an appointment on***.  Claritin and Mucinex.  He is here today for evaluation repeat blood work before and are going cycle #5.  MEDICAL HISTORY: Past Medical History:  Diagnosis Date   Condyloma acuminatum of scrotum    COVID 2021   December 2022 - mild   History of radiation therapy    Left Lung- 07/23/21-08/02/21- Dr. Antony Blackbird   History of radiation therapy    Left chest, right Kidney- 10/14/22-10/24/22- Dr. Antony Blackbird   Lung cancer [facility])    Pneumonia    Scrotal lesion     ALLERGIES:  is allergic to tamiflu [oseltamivir  phosphate].  MEDICATIONS:  Current Outpatient Medications  Medication Sig Dispense Refill   benzonatate (TESSALON) 100 MG capsule Take 1 capsule (100 mg total) by mouth 3 (three) times daily as needed. (Patient not taking: Reported on 07/07/2023) 30 capsule 2   Cyanocobalamin (B-12) 1000 MCG TABS Take 1 tablet by mouth daily.     diphenoxylate-atropine (LOMOTIL) 2.5-0.025 MG tablet TAKE 1 TABLET BY MOUTH FOUR TIMES DAILY AS NEEDED FOR DIARRHEA OR LOOSE STOOLS (Patient not taking: Reported on  07/07/2023) 30 tablet 1   finasteride (PROSCAR) 5 MG tablet Take 1 tablet (5 mg total) by mouth daily. 90 tablet 3   gabapentin (NEURONTIN) 100 MG capsule Take 1 capsule (100 mg total) by mouth 3 (three) times daily. (Patient not taking: Reported on 07/07/2023) 90 capsule 2   lidocaine-prilocaine (EMLA) cream Apply to the Port-A-Cath site 30 minutes before chemotherapy. 30 g 0   methylPREDNISolone (MEDROL DOSEPAK) 4 MG TBPK tablet Use as instructed 21 tablet 0   mirtazapine (REMERON) 15 MG tablet Take 1 tablet (15 mg total) by mouth at bedtime. (Patient not taking: Reported on 07/07/2023) 30 tablet 2   omeprazole (PRILOSEC) 20 MG capsule Take 1 capsule (20 mg total) by mouth daily. (Patient not taking: Reported on 07/07/2023) 30 capsule 2   potassium chloride SA (KLOR-CON M) 20 MEQ tablet Take 1 tablet (20 mEq total) by mouth 2 (two) times daily. (Patient not taking: Reported on 07/07/2023) 14 tablet 0   prochlorperazine (COMPAZINE) 10 MG tablet Take 1 tablet (10 mg total) by mouth every 6 (six) hours as needed for nausea or vomiting. 30 tablet 0   triamcinolone cream (KENALOG) 0.1 % Apply 1 application  topically 2 (two) times daily as needed (dry skin).     umeclidinium-vilanterol (ANORO ELLIPTA) 62.5-25 MCG/ACT AEPB Inhale 1 puff into the lungs daily. 7 each 0   No current facility-administered medications for this visit.   Facility-Administered Medications Ordered in Other Visits  Medication Dose Route Frequency Provider Last Rate Last Admin   heparin lock flush 100 unit/mL  500 Units Intravenous Once Michaelyn Wall L, PA-C       sodium chloride flush (NS) 0.9 % injection 10 mL  10 mL Intravenous PRN Hazelle Woollard L, PA-C        SURGICAL HISTORY:  Past Surgical History:  Procedure Laterality Date   BRONCHIAL BIOPSY  07/02/2021   Procedure: BRONCHIAL BIOPSIES;  Surgeon: Josephine Igo, DO;  Location: [facility];  Service: Pulmonary;;   BRONCHIAL BRUSHINGS  07/02/2021    Procedure: BRONCHIAL BRUSHINGS;  Surgeon: Josephine Igo, DO;  Location: [facility];  Service: Pulmonary;;   BRONCHIAL NEEDLE ASPIRATION BIOPSY  07/02/2021   Procedure: BRONCHIAL NEEDLE ASPIRATION BIOPSIES;  Surgeon: Josephine Igo, DO;  Location: [facility];  Service: Pulmonary;;   HERNIA REPAIR  2009   left inguinal   IR IMAGING GUIDED PORT INSERTION  07/16/2021   SCROTAL EXPLORATION  11/21/2011   Procedure: SCROTUM EXPLORATION;  Surgeon: Lindaann Slough, MD;  Location: [facility];  Service: Urology;  Laterality: N/A;  EXCISION, BIOPSY SCROTAL CONDYLOMA    VIDEO BRONCHOSCOPY WITH ENDOBRONCHIAL ULTRASOUND Bilateral 07/02/2021   Procedure: VIDEO BRONCHOSCOPY WITH ENDOBRONCHIAL ULTRASOUND;  Surgeon: Josephine Igo, DO;  Location: [facility];  Service: Pulmonary;  Laterality: Bilateral;    REVIEW OF SYSTEMS:   Review of Systems  Constitutional: Negative for appetite change, chills, fatigue, fever and unexpected weight change.  HENT:   Negative for mouth sores, nosebleeds, sore throat and trouble  swallowing.   Eyes: Negative for eye problems and icterus.  Respiratory: Negative for cough, hemoptysis, shortness of breath and wheezing.   Cardiovascular: Negative for chest pain and leg swelling.  Gastrointestinal: Negative for abdominal pain, constipation, diarrhea, nausea and vomiting.  Genitourinary: Negative for bladder incontinence, difficulty urinating, dysuria, frequency and hematuria.   Musculoskeletal: Negative for back pain, gait problem, neck pain and neck stiffness.  Skin: Negative for itching and rash.  Neurological: Negative for dizziness, extremity weakness, gait problem, headaches, light-headedness and seizures.  Hematological: Negative for adenopathy. Does not bruise/bleed easily.  Psychiatric/Behavioral: Negative for confusion, depression and sleep disturbance. The patient is not nervous/anxious.     PHYSICAL EXAMINATION:  There were no vitals taken for this visit.  ECOG  PERFORMANCE STATUS: {CHL ONC ECOG [id number]  Physical Exam  Constitutional: Oriented to person, place, and time and well-developed, well-nourished, and in no distress. No distress.  HENT:  Head: Normocephalic and atraumatic.  Mouth/Throat: Oropharynx is clear and moist. No oropharyngeal exudate.  Eyes: Conjunctivae are normal. Right eye exhibits no discharge. Left eye exhibits no discharge. No scleral icterus.  Neck: Normal range of motion. Neck supple.  Cardiovascular: Normal rate, regular rhythm, normal heart sounds and intact distal pulses.   Pulmonary/Chest: Effort normal and breath sounds normal. No respiratory distress. No wheezes. No rales.  Abdominal: Soft. Bowel sounds are normal. Exhibits no distension and no mass. There is no tenderness.  Musculoskeletal: Normal range of motion. Exhibits no edema.  Lymphadenopathy:    No cervical adenopathy.  Neurological: Alert and oriented to person, place, and time. Exhibits normal muscle tone. Gait normal. Coordination normal.  Skin: Skin is warm and dry. No rash noted. Not diaphoretic. No erythema. No pallor.  Psychiatric: Mood, memory and judgment normal.  Vitals reviewed.  LABORATORY DATA: Lab Results  Component Value Date   WBC 12.0 (H) 07/07/2023   HGB 10.0 (L) 07/07/2023   HCT 29.8 (L) 07/07/2023   MCV 97.4 07/07/2023   PLT 141 (L) 07/07/2023      Chemistry      Component Value Date/Time   NA 128 (L) 07/07/2023 0949   K 4.2 07/07/2023 0949   CL 96 (L) 07/07/2023 0949   CO2 26 07/07/2023 0949   BUN 19 07/07/2023 0949   CREATININE 0.99 07/07/2023 0949      Component Value Date/Time   CALCIUM 9.5 07/07/2023 0949   ALKPHOS 148 (H) 07/07/2023 0949   AST 14 (L) 07/07/2023 0949   ALT 6 07/07/2023 0949   BILITOT 0.4 07/07/2023 0949       RADIOGRAPHIC STUDIES:  No results found.   ASSESSMENT/PLAN:  This is a very pleasant 78 year old Caucasian male with stage IV (T3, N1, M1 B) non-small cell lung cancer,  favoring squamous cell carcinoma.  The patient presented with a superior segment left upper lobe lung mass with invasion of the chest and destruction of the posterior left sixth and seventh ribs as well as left hilar adenopathy.  The patient has suspicious pleural metastases.  The patient was diagnosed in March 2023.    The patient underwent palliative radiation to left lower lobe lung mass and chest wall lesion under the care of Dr. Roselind Messier.   He then underwent systemic chemotherapy with carboplatin for an AUC 5, paclitaxel 175 mg per metered squared, Libtayo 350 mg IV every 3 weeks.  He completed 4 cycles this was started on 07/18/2021.     He then started maintenance treatment with single agent Libtayo 350 mg IV  every 3 weeks starting 10/10/2021.  Cycle 3 was delayed due to significant fatigue and weakness. This was discontinued due to suspicious colitis and diarrhea.    He is also status post palliative radiotherapy to the progressive disease in the right adrenal gland as well as the left chest under the care of Dr. Roselind Messier completed October 24, 2022.     The scan showed progression with progressive RML mass and LLL/posterior perihilar lesion suggesting additional site of recurrence in December.    Dr. Arbutus Ped recommends assuming his systemic treatment.  Since the patient had significant intolerance to immunotherapy in the past with colitis, Dr. Arbutus Ped is going to recommend resuming chemotherapy with carboplatin for an AUC of 5 and paclitaxel 175 mg/m for 4-6 cycles.  If he has good tolerance we may go up to 6 cycles. He is status post 4 cycles.  Reviewed the patient's doses of chemotherapy with Dr. Arbutus Ped last week.  Dr. Arbutus Ped like to continue him on the same treatment at the same dose at this time.  Labs were reviewed.  The patient will proceed with cycle #5 today scheduled.  We will see him back for follow-up visit in 3 weeks for evaluation repeat blood work before undergoing cycle  #6.  Patient was advised to salt water rinses and Biotene for his dry mouth.  He will continue to drink plenty of water.  The patient reports he does sleep with his mouth open which may be the reason why he wakes up with a dry mouth.   He was instructed to increase his protein supplemental drink intake to 2-3 times per day.   He will call GI back to schedule appointment for the pancreas lesion.     We will continue to monitor his labs closely on a weekly basis.    Patient was advised should he develop any signs or symptoms of infection in the interval to reach back out to [place] for re-evaluation  The patient was advised to call immediately if he has any concerning symptoms in the interval. The patient voices understanding of current disease status and treatment options and is in agreement with the current care plan. All questions were answered. The patient knows to call the clinic with any problems, questions or concerns. We can certainly see the patient much sooner if necessary     No orders of the defined types were placed in this encounter.    I spent {CHL ONC TIME VISIT - ZOXWR:[id number]} counseling the patient face to face. The total time spent in the appointment was {CHL ONC TIME VISIT - BJYNW:[id number]}.  Cortnie Ringel L Jenniferann Stuckert, PA-C 07/14/23

## 2023-07-16 ENCOUNTER — Inpatient Hospital Stay

## 2023-07-16 ENCOUNTER — Telehealth: Payer: Self-pay | Admitting: Physician Assistant

## 2023-07-16 ENCOUNTER — Inpatient Hospital Stay (HOSPITAL_BASED_OUTPATIENT_CLINIC_OR_DEPARTMENT_OTHER): Admitting: Physician Assistant

## 2023-07-16 ENCOUNTER — Telehealth: Payer: Self-pay

## 2023-07-16 ENCOUNTER — Ambulatory Visit (HOSPITAL_COMMUNITY)
Admission: RE | Admit: 2023-07-16 | Discharge: 2023-07-16 | Disposition: A | Discharge status: Home / Self Care | Source: Ambulatory Visit | Attending: Physician Assistant | Admitting: Physician Assistant

## 2023-07-16 ENCOUNTER — Other Ambulatory Visit: Payer: Self-pay

## 2023-07-16 VITALS — BP 111/82 | HR 106 | Temp 98.3°F | Resp 16 | Wt 117.0 lb

## 2023-07-16 DIAGNOSIS — R059 Cough, unspecified: Secondary | ICD-10-CM

## 2023-07-16 DIAGNOSIS — E860 Dehydration: Secondary | ICD-10-CM

## 2023-07-16 DIAGNOSIS — C3492 Malignant neoplasm of unspecified part of left bronchus or lung: Secondary | ICD-10-CM

## 2023-07-16 DIAGNOSIS — R918 Other nonspecific abnormal finding of lung field: Secondary | ICD-10-CM | POA: Diagnosis not present

## 2023-07-16 DIAGNOSIS — Z85118 Personal history of other malignant neoplasm of bronchus and lung: Secondary | ICD-10-CM | POA: Diagnosis not present

## 2023-07-16 DIAGNOSIS — J984 Other disorders of lung: Secondary | ICD-10-CM | POA: Diagnosis not present

## 2023-07-16 DIAGNOSIS — Z95828 Presence of other vascular implants and grafts: Secondary | ICD-10-CM

## 2023-07-16 DIAGNOSIS — E871 Hypo-osmolality and hyponatremia: Secondary | ICD-10-CM | POA: Insufficient documentation

## 2023-07-16 LAB — CMP (CANCER CENTER ONLY)
ALT: 9 U/L (ref 0–44)
AST: 15 U/L (ref 15–41)
Albumin: 3.4 g/dL — ABNORMAL LOW (ref 3.5–5.0)
Alkaline Phosphatase: 141 U/L — ABNORMAL HIGH (ref 38–126)
Anion gap: 4 — ABNORMAL LOW (ref 5–15)
BUN: 18 mg/dL (ref 8–23)
CO2: 28 mmol/L (ref 22–32)
Calcium: 9.5 mg/dL (ref 8.9–10.3)
Chloride: 92 mmol/L — ABNORMAL LOW (ref 98–111)
Creatinine: 0.96 mg/dL (ref 0.61–1.24)
GFR, Estimated: >60 mL/min (ref >60)
Glucose, Bld: 97 mg/dL (ref 70–99)
Potassium: 4.2 mmol/L (ref 3.5–5.1)
Sodium: 124 mmol/L — ABNORMAL LOW (ref 135–145)
Total Bilirubin: 0.3 mg/dL (ref 0.0–1.2)
Total Protein: 7.3 g/dL (ref 6.5–8.1)

## 2023-07-16 LAB — CBC WITH DIFFERENTIAL (CANCER CENTER ONLY)
Abs Immature Granulocytes: 0.34 10*3/uL — ABNORMAL HIGH (ref 0.00–0.07)
Basophils Absolute: 0.1 10*3/uL (ref 0.0–0.1)
Basophils Relative: 1 %
Eosinophils Absolute: 0.1 10*3/uL (ref 0.0–0.5)
Eosinophils Relative: 1 %
HCT: 30.6 % — ABNORMAL LOW (ref 39.0–52.0)
Hemoglobin: 10.7 g/dL — ABNORMAL LOW (ref 13.0–17.0)
Immature Granulocytes: 3 %
Lymphocytes Relative: 4 %
Lymphs Abs: 0.4 10*3/uL — ABNORMAL LOW (ref 0.7–4.0)
MCH: 33.1 pg (ref 26.0–34.0)
MCHC: 35 g/dL (ref 30.0–36.0)
MCV: 94.7 fL (ref 80.0–100.0)
Monocytes Absolute: 1 10*3/uL (ref 0.1–1.0)
Monocytes Relative: 10 %
Neutro Abs: 8.8 10*3/uL — ABNORMAL HIGH (ref 1.7–7.7)
Neutrophils Relative %: 81 %
Platelet Count: 115 10*3/uL — ABNORMAL LOW (ref 150–400)
RBC: 3.23 MIL/uL — ABNORMAL LOW (ref 4.22–5.81)
RDW: 16.4 % — ABNORMAL HIGH (ref 11.5–15.5)
WBC Count: 10.8 10*3/uL — ABNORMAL HIGH (ref 4.0–10.5)
nRBC: 0 % (ref 0.0–0.2)

## 2023-07-16 LAB — SAMPLE TO BLOOD BANK

## 2023-07-16 MED ORDER — HEPARIN SOD (PORK) LOCK FLUSH 100 UNIT/ML IV SOLN
500.0000 [IU] | Freq: Once | INTRAVENOUS | Status: AC
Start: 2023-07-16 — End: 2023-07-16
  Administered 2023-07-16: 500 [IU]

## 2023-07-16 MED ORDER — SODIUM CHLORIDE 0.9% FLUSH
10.0000 mL | Freq: Once | INTRAVENOUS | Status: AC
  Administered 2023-07-16: 10 mL

## 2023-07-16 MED ORDER — SODIUM CHLORIDE 1 G PO TABS: 1.0000 g | ORAL_TABLET | Freq: Two times a day (BID) | ORAL | 0 refills | Status: DC

## 2023-07-16 MED FILL — Fosaprepitant Dimeglumine For IV Infusion 150 MG (Base Eq): INTRAVENOUS | Qty: 5 | Status: AC

## 2023-07-16 NOTE — Telephone Encounter (Signed)
 LVM with Referrals at [facility] about referral placed on 06/08/2023.  Patient has been seen with [facility] but prefers to be seen at [facility].  LVM for a return call if any additional information is needed from our office.

## 2023-07-16 NOTE — Telephone Encounter (Signed)
 I called the patient to review the CXR. I reviewed it with Dr. Arbutus Ped. The CXR did not show any acute process. It showed Decreased left perihilar opacity compared to his last CT scan. Therefore, Dr. Arbutus Ped does not feel antibiotics are needed. I did urge them to call us should he develop any worsening symptoms though in the interval since he is on chemotherapy and if he has worsening symptoms as CXR have limitations. She expressed understanding.

## 2023-07-17 ENCOUNTER — Other Ambulatory Visit: Payer: Self-pay

## 2023-07-17 ENCOUNTER — Telehealth: Payer: Self-pay | Admitting: Internal Medicine

## 2023-07-17 ENCOUNTER — Inpatient Hospital Stay

## 2023-07-17 VITALS — BP 98/60 | HR 96 | Temp 98.0°F | Resp 16

## 2023-07-17 DIAGNOSIS — E860 Dehydration: Secondary | ICD-10-CM

## 2023-07-17 DIAGNOSIS — C7951 Secondary malignant neoplasm of bone: Secondary | ICD-10-CM | POA: Diagnosis not present

## 2023-07-17 DIAGNOSIS — Z5189 Encounter for other specified aftercare: Secondary | ICD-10-CM | POA: Diagnosis not present

## 2023-07-17 DIAGNOSIS — C782 Secondary malignant neoplasm of pleura: Secondary | ICD-10-CM | POA: Diagnosis not present

## 2023-07-17 DIAGNOSIS — C3432 Malignant neoplasm of lower lobe, left bronchus or lung: Secondary | ICD-10-CM | POA: Diagnosis not present

## 2023-07-17 DIAGNOSIS — C3492 Malignant neoplasm of unspecified part of left bronchus or lung: Secondary | ICD-10-CM

## 2023-07-17 DIAGNOSIS — Z5111 Encounter for antineoplastic chemotherapy: Secondary | ICD-10-CM | POA: Diagnosis not present

## 2023-07-17 MED ORDER — HEPARIN SOD (PORK) LOCK FLUSH 100 UNIT/ML IV SOLN
500.0000 [IU] | Freq: Once | INTRAVENOUS | Status: AC | PRN
Start: 2023-07-17 — End: 2023-07-17
  Administered 2023-07-17: 500 [IU]

## 2023-07-17 MED ORDER — SODIUM CHLORIDE 0.9 % IV SOLN
150.0000 mg | Freq: Once | INTRAVENOUS | Status: AC
  Administered 2023-07-17: 150 mg via INTRAVENOUS
  Filled 2023-07-17: qty 5
  Filled 2023-07-17: qty 150

## 2023-07-17 MED ORDER — SODIUM CHLORIDE 0.9 % IV SOLN
INTRAVENOUS | Status: DC
Start: 2023-07-17 — End: 2023-07-17

## 2023-07-17 MED ORDER — CARBOPLATIN CHEMO INJECTION 450 MG/45ML
270.0000 mg | Freq: Once | INTRAVENOUS | Status: AC
  Administered 2023-07-17: 270 mg via INTRAVENOUS
  Filled 2023-07-17: qty 27

## 2023-07-17 MED ORDER — DEXAMETHASONE SODIUM PHOSPHATE 10 MG/ML IJ SOLN
10.0000 mg | Freq: Once | INTRAMUSCULAR | Status: AC
  Administered 2023-07-17: 10 mg via INTRAVENOUS
  Filled 2023-07-17: qty 1

## 2023-07-17 MED ORDER — FAMOTIDINE IN NACL 20-0.9 MG/50ML-% IV SOLN
20.0000 mg | Freq: Once | INTRAVENOUS | Status: AC
  Administered 2023-07-17: 20 mg via INTRAVENOUS
  Filled 2023-07-17: qty 50

## 2023-07-17 MED ORDER — SODIUM CHLORIDE 0.9 % IV SOLN: Freq: Once | INTRAVENOUS | Status: AC

## 2023-07-17 MED ORDER — SODIUM CHLORIDE 0.9% FLUSH
10.0000 mL | INTRAVENOUS | Status: DC | PRN
  Administered 2023-07-17: 10 mL

## 2023-07-17 MED ORDER — SODIUM CHLORIDE 0.9 % IV SOLN
150.0000 mg/m2 | Freq: Once | INTRAVENOUS | Status: AC
  Administered 2023-07-17: 270 mg via INTRAVENOUS
  Filled 2023-07-17: qty 45

## 2023-07-17 MED ORDER — DIPHENHYDRAMINE HCL 50 MG/ML IJ SOLN
50.0000 mg | Freq: Once | INTRAMUSCULAR | Status: AC
Start: 2023-07-17 — End: 2023-07-17
  Administered 2023-07-17: 50 mg via INTRAVENOUS
  Filled 2023-07-17: qty 1

## 2023-07-17 MED ORDER — PALONOSETRON HCL INJECTION 0.25 MG/5ML
0.2500 mg | Freq: Once | INTRAVENOUS | Status: AC
Start: 2023-07-17 — End: 2023-07-17
  Administered 2023-07-17: 0.25 mg via INTRAVENOUS
  Filled 2023-07-17: qty 5

## 2023-07-17 NOTE — Progress Notes (Signed)
 Ok to decrease carboplatin 270mg  (AUC = 4)

## 2023-07-17 NOTE — Patient Instructions (Signed)
 [facility] - A DEPT OF [facility]  Discharge Instructions: Thank you for choosing [facility] to provide your oncology and hematology care.   If you have a lab appointment with the Cancer Center, please go directly to the Cancer Center and check in at the registration area.   Wear comfortable clothing and clothing appropriate for easy access to any Portacath or PICC line.   We strive to give you quality time with your provider. You may need to reschedule your appointment if you arrive late (15 or more minutes).  Arriving late affects you and other patients whose appointments are after yours.  Also, if you miss three or more appointments without notifying the office, you may be dismissed from the clinic at the provider's discretion.      For prescription refill requests, have your pharmacy contact our office and allow 72 hours for refills to be completed.    Today you received the following chemotherapy and/or immunotherapy agents CARBOplatin (PARAPLATIN) and PACLitaxel (TAXOL)       To help prevent nausea and vomiting after your treatment, we encourage you to take your nausea medication as directed.  BELOW ARE SYMPTOMS THAT SHOULD BE REPORTED IMMEDIATELY: *FEVER GREATER THAN 100.4 F (38 C) OR HIGHER *CHILLS OR SWEATING *NAUSEA AND VOMITING THAT IS NOT CONTROLLED WITH YOUR NAUSEA MEDICATION *UNUSUAL SHORTNESS OF BREATH *UNUSUAL BRUISING OR BLEEDING *URINARY PROBLEMS (pain or burning when urinating, or frequent urination) *BOWEL PROBLEMS (unusual diarrhea, constipation, pain near the anus) TENDERNESS IN MOUTH AND THROAT WITH OR WITHOUT PRESENCE OF ULCERS (sore throat, sores in mouth, or a toothache) UNUSUAL RASH, SWELLING OR PAIN  UNUSUAL VAGINAL DISCHARGE OR ITCHING   Items with * indicate a potential emergency and should be followed up as soon as possible or go to the Emergency Department if any problems should occur.  Please show the  CHEMOTHERAPY ALERT CARD or IMMUNOTHERAPY ALERT CARD at check-in to the Emergency Department and triage nurse.  Should you have questions after your visit or need to cancel or reschedule your appointment, please contact [facility] - A DEPT OF [facility]  Dept: [phone]  and follow the prompts.  Office hours are 8:00 a.m. to 4:30 p.m. Monday - Friday. Please note that voicemails left after 4:00 p.m. may not be returned until the following business day.  We are closed weekends and major holidays. You have access to a nurse at all times for urgent questions. Please call the main number to the clinic Dept: [phone] and follow the prompts.   For any non-urgent questions, you may also contact your provider using MyChart. We now offer e-Visits for anyone 23 and older to request care online for non-urgent symptoms. For details visit [email or web address].   Also download the MyChart app! Go to the app store, search "MyChart", open the app, select [place], and log in with your MyChart username and password.

## 2023-07-17 NOTE — Telephone Encounter (Signed)
 Good morning Dr. Leonides Schanz  The following patient is being referred to [place] for a pancreatic lesion. He currently has stage IV cancer and wishes to keep his doctors within [place] [facility]. He did see Dr. Dulce Sellar once in 2023. Records are available with Care Everywhere in Epic. Please review and advise of scheduling. Thank you.

## 2023-07-20 ENCOUNTER — Other Ambulatory Visit: Payer: Medicare Other

## 2023-07-20 ENCOUNTER — Ambulatory Visit: Payer: Medicare Other

## 2023-07-20 ENCOUNTER — Telehealth: Payer: Self-pay | Admitting: Medical Oncology

## 2023-07-20 ENCOUNTER — Inpatient Hospital Stay

## 2023-07-20 ENCOUNTER — Telehealth: Payer: Self-pay

## 2023-07-20 ENCOUNTER — Ambulatory Visit: Payer: Medicare Other | Admitting: Internal Medicine

## 2023-07-20 VITALS — BP 97/66 | HR 102 | Temp 97.7°F | Resp 18

## 2023-07-20 DIAGNOSIS — C3432 Malignant neoplasm of lower lobe, left bronchus or lung: Secondary | ICD-10-CM | POA: Diagnosis not present

## 2023-07-20 DIAGNOSIS — C7951 Secondary malignant neoplasm of bone: Secondary | ICD-10-CM | POA: Diagnosis not present

## 2023-07-20 DIAGNOSIS — Z95828 Presence of other vascular implants and grafts: Secondary | ICD-10-CM

## 2023-07-20 DIAGNOSIS — Z5189 Encounter for other specified aftercare: Secondary | ICD-10-CM | POA: Diagnosis not present

## 2023-07-20 DIAGNOSIS — C3492 Malignant neoplasm of unspecified part of left bronchus or lung: Secondary | ICD-10-CM

## 2023-07-20 DIAGNOSIS — C782 Secondary malignant neoplasm of pleura: Secondary | ICD-10-CM | POA: Diagnosis not present

## 2023-07-20 DIAGNOSIS — Z5111 Encounter for antineoplastic chemotherapy: Secondary | ICD-10-CM | POA: Diagnosis not present

## 2023-07-20 LAB — CBC WITH DIFFERENTIAL (CANCER CENTER ONLY)
Abs Immature Granulocytes: 0.06 10*3/uL (ref 0.00–0.07)
Basophils Absolute: 0 10*3/uL (ref 0.0–0.1)
Basophils Relative: 0 %
Eosinophils Absolute: 0.1 10*3/uL (ref 0.0–0.5)
Eosinophils Relative: 1 %
HCT: 27.7 % — ABNORMAL LOW (ref 39.0–52.0)
Hemoglobin: 9.5 g/dL — ABNORMAL LOW (ref 13.0–17.0)
Immature Granulocytes: 1 %
Lymphocytes Relative: 2 %
Lymphs Abs: 0.2 10*3/uL — ABNORMAL LOW (ref 0.7–4.0)
MCH: 33.2 pg (ref 26.0–34.0)
MCHC: 34.3 g/dL (ref 30.0–36.0)
MCV: 96.9 fL (ref 80.0–100.0)
Monocytes Absolute: 0.1 10*3/uL (ref 0.1–1.0)
Monocytes Relative: 1 %
Neutro Abs: 10.2 10*3/uL — ABNORMAL HIGH (ref 1.7–7.7)
Neutrophils Relative %: 95 %
Platelet Count: 64 10*3/uL — ABNORMAL LOW (ref 150–400)
RBC: 2.86 MIL/uL — ABNORMAL LOW (ref 4.22–5.81)
RDW: 16.1 % — ABNORMAL HIGH (ref 11.5–15.5)
Smear Review: NORMAL
WBC Count: 10.6 10*3/uL — ABNORMAL HIGH (ref 4.0–10.5)
nRBC: 0 % (ref 0.0–0.2)

## 2023-07-20 LAB — SAMPLE TO BLOOD BANK

## 2023-07-20 LAB — CMP (CANCER CENTER ONLY)
ALT: 10 U/L (ref 0–44)
AST: 17 U/L (ref 15–41)
Albumin: 3.1 g/dL — ABNORMAL LOW (ref 3.5–5.0)
Alkaline Phosphatase: 117 U/L (ref 38–126)
Anion gap: 4 — ABNORMAL LOW (ref 5–15)
BUN: 23 mg/dL (ref 8–23)
CO2: 26 mmol/L (ref 22–32)
Calcium: 8.8 mg/dL — ABNORMAL LOW (ref 8.9–10.3)
Chloride: 98 mmol/L (ref 98–111)
Creatinine: 0.83 mg/dL (ref 0.61–1.24)
GFR, Estimated: >60 mL/min (ref >60)
Glucose, Bld: 90 mg/dL (ref 70–99)
Potassium: 4.1 mmol/L (ref 3.5–5.1)
Sodium: 128 mmol/L — ABNORMAL LOW (ref 135–145)
Total Bilirubin: 0.4 mg/dL (ref 0.0–1.2)
Total Protein: 6.5 g/dL (ref 6.5–8.1)

## 2023-07-20 MED ORDER — PEGFILGRASTIM-CBQV 6 MG/0.6ML ~~LOC~~ SOSY
6.0000 mg | PREFILLED_SYRINGE | Freq: Once | SUBCUTANEOUS | Status: AC
Start: 2023-07-20 — End: 2023-07-20
  Administered 2023-07-20: 6 mg via SUBCUTANEOUS
  Filled 2023-07-20: qty 0.6

## 2023-07-20 MED ORDER — SODIUM CHLORIDE 0.9% FLUSH
10.0000 mL | Freq: Once | INTRAVENOUS | Status: AC
Start: 2023-07-20 — End: 2023-07-20
  Administered 2023-07-20: 10 mL

## 2023-07-20 MED ORDER — HEPARIN SOD (PORK) LOCK FLUSH 100 UNIT/ML IV SOLN
500.0000 [IU] | Freq: Once | INTRAVENOUS | Status: AC
  Administered 2023-07-20: 500 [IU]

## 2023-07-20 NOTE — Telephone Encounter (Signed)
 Reviewed bleeding precautions with pt wife because he was sleeping.

## 2023-07-20 NOTE — Telephone Encounter (Signed)
 Tried to reach patient in regards to lab results.  Per Cassie, PA- Please review bleeding pre-cautions with patient.  LVM for return call.

## 2023-07-22 ENCOUNTER — Ambulatory Visit: Payer: Medicare Other

## 2023-07-23 ENCOUNTER — Inpatient Hospital Stay

## 2023-07-27 ENCOUNTER — Other Ambulatory Visit: Payer: Medicare Other

## 2023-07-27 ENCOUNTER — Ambulatory Visit

## 2023-07-27 ENCOUNTER — Telehealth: Payer: Self-pay

## 2023-07-27 ENCOUNTER — Other Ambulatory Visit: Payer: Self-pay | Admitting: Physician Assistant

## 2023-07-27 ENCOUNTER — Other Ambulatory Visit

## 2023-07-27 ENCOUNTER — Inpatient Hospital Stay

## 2023-07-27 ENCOUNTER — Ambulatory Visit: Admitting: Physician Assistant

## 2023-07-27 DIAGNOSIS — C3492 Malignant neoplasm of unspecified part of left bronchus or lung: Secondary | ICD-10-CM

## 2023-07-27 DIAGNOSIS — Z5111 Encounter for antineoplastic chemotherapy: Secondary | ICD-10-CM | POA: Diagnosis not present

## 2023-07-27 DIAGNOSIS — C782 Secondary malignant neoplasm of pleura: Secondary | ICD-10-CM | POA: Diagnosis not present

## 2023-07-27 DIAGNOSIS — C7951 Secondary malignant neoplasm of bone: Secondary | ICD-10-CM | POA: Diagnosis not present

## 2023-07-27 DIAGNOSIS — C3432 Malignant neoplasm of lower lobe, left bronchus or lung: Secondary | ICD-10-CM | POA: Diagnosis not present

## 2023-07-27 DIAGNOSIS — Z5189 Encounter for other specified aftercare: Secondary | ICD-10-CM | POA: Diagnosis not present

## 2023-07-27 DIAGNOSIS — Z95828 Presence of other vascular implants and grafts: Secondary | ICD-10-CM

## 2023-07-27 LAB — CBC WITH DIFFERENTIAL (CANCER CENTER ONLY)
Abs Immature Granulocytes: 0.58 10*3/uL — ABNORMAL HIGH (ref 0.00–0.07)
Basophils Absolute: 0.1 10*3/uL (ref 0.0–0.1)
Basophils Relative: 1 %
Eosinophils Absolute: 0.1 10*3/uL (ref 0.0–0.5)
Eosinophils Relative: 1 %
HCT: 25.4 % — ABNORMAL LOW (ref 39.0–52.0)
Hemoglobin: 8.8 g/dL — ABNORMAL LOW (ref 13.0–17.0)
Immature Granulocytes: 6 %
Lymphocytes Relative: 4 %
Lymphs Abs: 0.5 10*3/uL — ABNORMAL LOW (ref 0.7–4.0)
MCH: 33 pg (ref 26.0–34.0)
MCHC: 34.6 g/dL (ref 30.0–36.0)
MCV: 95.1 fL (ref 80.0–100.0)
Monocytes Absolute: 1 10*3/uL (ref 0.1–1.0)
Monocytes Relative: 9 %
Neutro Abs: 8.4 10*3/uL — ABNORMAL HIGH (ref 1.7–7.7)
Neutrophils Relative %: 79 %
Platelet Count: 36 10*3/uL — ABNORMAL LOW (ref 150–400)
RBC: 2.67 MIL/uL — ABNORMAL LOW (ref 4.22–5.81)
RDW: 14.7 % (ref 11.5–15.5)
Smear Review: DECREASED
WBC Count: 10.6 10*3/uL — ABNORMAL HIGH (ref 4.0–10.5)
nRBC: 0 % (ref 0.0–0.2)

## 2023-07-27 LAB — CMP (CANCER CENTER ONLY)
ALT: 7 U/L (ref 0–44)
AST: 12 U/L — ABNORMAL LOW (ref 15–41)
Albumin: 3.1 g/dL — ABNORMAL LOW (ref 3.5–5.0)
Alkaline Phosphatase: 121 U/L (ref 38–126)
Anion gap: 4 — ABNORMAL LOW (ref 5–15)
BUN: 8 mg/dL (ref 8–23)
CO2: 27 mmol/L (ref 22–32)
Calcium: 8.9 mg/dL (ref 8.9–10.3)
Chloride: 93 mmol/L — ABNORMAL LOW (ref 98–111)
Creatinine: 0.74 mg/dL (ref 0.61–1.24)
GFR, Estimated: >60 mL/min (ref >60)
Glucose, Bld: 82 mg/dL (ref 70–99)
Potassium: 3.9 mmol/L (ref 3.5–5.1)
Sodium: 124 mmol/L — ABNORMAL LOW (ref 135–145)
Total Bilirubin: 0.4 mg/dL (ref 0.0–1.2)
Total Protein: 6.1 g/dL — ABNORMAL LOW (ref 6.5–8.1)

## 2023-07-27 LAB — SAMPLE TO BLOOD BANK

## 2023-07-27 MED ORDER — HEPARIN SOD (PORK) LOCK FLUSH 100 UNIT/ML IV SOLN
250.0000 [IU] | Freq: Once | INTRAVENOUS | Status: AC
  Administered 2023-07-27: 250 [IU]

## 2023-07-27 MED ORDER — SODIUM CHLORIDE 0.9% FLUSH
10.0000 mL | Freq: Once | INTRAVENOUS | Status: AC
  Administered 2023-07-27: 10 mL

## 2023-07-27 NOTE — Telephone Encounter (Signed)
 Tried to reach patient in regards to lab results.  Per Cassie, PA- platelet count is low.  Review bleeding precautions with patient and offer port flush with lab appt to recheck lab work Thursday. LVM for return call.

## 2023-07-28 ENCOUNTER — Ambulatory Visit: Admitting: Physician Assistant

## 2023-07-28 ENCOUNTER — Ambulatory Visit

## 2023-07-28 ENCOUNTER — Other Ambulatory Visit

## 2023-07-29 NOTE — Telephone Encounter (Signed)
 PT is scheduled for 6/17 with Gina Lagos

## 2023-07-30 ENCOUNTER — Other Ambulatory Visit: Payer: Self-pay

## 2023-07-30 ENCOUNTER — Inpatient Hospital Stay

## 2023-07-30 ENCOUNTER — Ambulatory Visit

## 2023-08-02 NOTE — Progress Notes (Signed)
 [facility] OFFICE PROGRESS NOTE  Lizabeth Riggs, PA-C [address] [place] [address]  DIAGNOSIS: Stage IV (T3, N1, M1b) non-small cell lung cancer favoring squamous cell carcinoma presented with superior segment left lower lobe lung mass with invasion of the chest wall and destruction of the posterior left sixth and seventh ribs as well as left hilar adenopathy and suspicious pleural metastasis diagnosed in March 2023   PRIOR THERAPY: 1) Palliative radiotherapy to the left lower lobe lung mass with chest wall invasion under the care of Dr. Eloise Hake. 2) Palliative systemic chemotherapy with carboplatin  for AUC of 5, paclitaxel  175 Mg/M2 and Libtayo  (Cempilimab) 350 Mg IV every 3 weeks with Neulasta  support.  First dose was given on July 18, 2021.  Status post 4 cycles. 3) Maintenance treatment with Libtayo  (Cempilimab) 350 Mg IV every 3 weeks, first dose October 10, 2021 status post 3 cycles.  Discontinued secondary to intolerance with immunotherapy mediated colitis and persistent diarrhea. 4) status post palliative radiotherapy to the progressive disease in the right adrenal gland as well as the left chest under the care of Dr. Eloise Hake completed October 24, 2022  CURRENT THERAPY: Systemic chemotherapy with carboplatin  for AUC of 5 and paclitaxel  175 Mg/M2 every 3 weeks with Neulasta  support first dose April 06, 2023.  Status post 4 cycles.  His dose of carboplatin  was reduced to an AUC of 4 and paclitaxel  150 mg/m starting from cycle #5. We are discontinuing treatment after cycle #5 due to weakness.   INTERVAL HISTORY: Edward Beck 78 y.o. male returns to the clinic today for a follow-up visit accompanied by his wife. The patient was last seen in the clinic by Dr. Marguerita Shih and myself 3 weeks ago. At that time the patient was feeling fair but he had had some generalized fatigue and weakness that was more challenging with the more cumulative doses of chemo. He also had loss a lot  of weight. He was given an extra week to recover and given a Medrol  Dosepak to help with his energy, fatigue, and appetite.   He then resumed his treatment with dose reduced chemotherapy. He had cytopenias in the interval, especially with thrombocytopenia. He denies any abnormal bleeding or bruising.   He experiences persistent fatigue, which has remained unchanged since his last visit. The fatigue is noticeable but not severe. After his last treatment, he feels more fatigued for about 24 hours before returning to his baseline level of fatigue. Overall, he is cachetic and fatigue.  He has experienced a weight loss of approximately three to four pounds since his last visit. He has a decreased appetite, stating that he is 'just not eating' and that everything tastes terrible. His mouth feels dry, making it difficult to eat. No thrush is present, and he confirms adequate fluid intake, although he dislikes liquid IV solutions. He consumes one Boost nutritional drink per day, sometimes two, but finds them too sweet.  He experiences occasional coughing and gagging lasting about ten to fifteen minutes but denies hemoptysis. His shortness of breath has remained stable. No abnormal bleeding or bruising, including epistaxis, gum bleeding, or blood in vomit, stool, or urine.  No vomiting, diarrhea, or constipation out of the ordinary. He has an upcoming appointment with a gastroenterologist on June 17th.  He is currently taking salt tablets due to previously low sodium levels.  Prior CT scan showed a cystic lesion near the pancreas for which he was supposed to see GI June 17th.  He is here today for evaluation repeat blood work before considering undergoing cycle #6.  MEDICAL HISTORY: Past Medical History:  Diagnosis Date   Condyloma acuminatum of scrotum    COVID 2021   December 2022 - mild   History of radiation therapy    Left Lung- 07/23/21-08/02/21- Dr. Retta Caster   History of radiation therapy     Left chest, right Kidney- 10/14/22-10/24/22- Dr. Retta Caster   Lung cancer [facility])    Pneumonia    Scrotal lesion     ALLERGIES:  is allergic to tamiflu [oseltamivir phosphate].  MEDICATIONS:  Current Outpatient Medications  Medication Sig Dispense Refill   Fexofenadine HCl (MUCINEX ALLERGY PO) Take by mouth.     finasteride  (PROSCAR ) 5 MG tablet Take 1 tablet (5 mg total) by mouth daily. 90 tablet 3   lidocaine -prilocaine  (EMLA ) cream Apply to the Port-A-Cath site 30 minutes before chemotherapy. 30 g 0   loratadine (CLARITIN) 10 MG tablet Take 10 mg by mouth daily.     prochlorperazine  (COMPAZINE ) 10 MG tablet Take 1 tablet (10 mg total) by mouth every 6 (six) hours as needed for nausea or vomiting. 30 tablet 0   triamcinolone cream (KENALOG) 0.1 % Apply 1 application  topically 2 (two) times daily as needed (dry skin).     umeclidinium-vilanterol (ANORO ELLIPTA ) 62.5-25 MCG/ACT AEPB Inhale 1 puff into the lungs daily. 7 each 0   benzonatate  (TESSALON ) 100 MG capsule Take 1 capsule (100 mg total) by mouth 3 (three) times daily as needed. (Patient not taking: Reported on 08/06/2023) 30 capsule 2   Cyanocobalamin  (B-12) 1000 MCG TABS Take 1 tablet by mouth daily. (Patient not taking: Reported on 08/06/2023)     diphenoxylate -atropine  (LOMOTIL ) 2.5-0.025 MG tablet TAKE 1 TABLET BY MOUTH FOUR TIMES DAILY AS NEEDED FOR DIARRHEA OR LOOSE STOOLS (Patient not taking: Reported on 08/06/2023) 30 tablet 1   gabapentin  (NEURONTIN ) 100 MG capsule Take 1 capsule (100 mg total) by mouth 3 (three) times daily. (Patient not taking: Reported on 08/06/2023) 90 capsule 2   methylPREDNISolone  (MEDROL  DOSEPAK) 4 MG TBPK tablet Use as instructed (Patient not taking: Reported on 08/06/2023) 21 tablet 0   mirtazapine  (REMERON ) 15 MG tablet Take 1 tablet (15 mg total) by mouth at bedtime. (Patient not taking: Reported on 08/06/2023) 30 tablet 2   omeprazole  (PRILOSEC) 20 MG capsule Take 1 capsule (20 mg total) by mouth daily.  (Patient not taking: Reported on 08/06/2023) 30 capsule 2   potassium chloride  SA (KLOR-CON  M) 20 MEQ tablet Take 1 tablet (20 mEq total) by mouth 2 (two) times daily. (Patient not taking: Reported on 08/06/2023) 14 tablet 0   sodium chloride  1 g tablet Take 1 tablet (1 g total) by mouth 2 (two) times daily with a meal. (Patient not taking: Reported on 08/06/2023) 14 tablet 0   No current facility-administered medications for this visit.   Facility-Administered Medications Ordered in Other Visits  Medication Dose Route Frequency Provider Last Rate Last Admin   heparin  lock flush 100 unit/mL  500 Units Intravenous Once Dauna Ziska L, PA-C       sodium chloride  flush (NS) 0.9 % injection 10 mL  10 mL Intravenous PRN Duanne Duchesne L, PA-C        SURGICAL HISTORY:  Past Surgical History:  Procedure Laterality Date   BRONCHIAL BIOPSY  07/02/2021   Procedure: BRONCHIAL BIOPSIES;  Surgeon: Prudy Brownie, DO;  Location: [facility];  Service: Pulmonary;;   BRONCHIAL BRUSHINGS  07/02/2021  Procedure: BRONCHIAL BRUSHINGS;  Surgeon: Prudy Brownie, DO;  Location: [facility];  Service: Pulmonary;;   BRONCHIAL NEEDLE ASPIRATION BIOPSY  07/02/2021   Procedure: BRONCHIAL NEEDLE ASPIRATION BIOPSIES;  Surgeon: Prudy Brownie, DO;  Location: [facility];  Service: Pulmonary;;   HERNIA REPAIR  2009   left inguinal   IR IMAGING GUIDED PORT INSERTION  07/16/2021   SCROTAL EXPLORATION  11/21/2011   Procedure: SCROTUM EXPLORATION;  Surgeon: Jinny Mounts, MD;  Location: [facility];  Service: Urology;  Laterality: N/A;  EXCISION, BIOPSY SCROTAL CONDYLOMA    VIDEO BRONCHOSCOPY WITH ENDOBRONCHIAL ULTRASOUND Bilateral 07/02/2021   Procedure: VIDEO BRONCHOSCOPY WITH ENDOBRONCHIAL ULTRASOUND;  Surgeon: Prudy Brownie, DO;  Location: [facility];  Service: Pulmonary;  Laterality: Bilateral;    REVIEW OF SYSTEMS:   Constitutional: Positive for weight loss, appetite change, and fatigue.  Negative for chills and fever.  HENT: Negative for mouth sores, nosebleeds, sore throat and trouble swallowing.   Eyes: Negative for eye problems and icterus.  Respiratory: Positive for shortness of breath and intermittent cough. Negative for hemoptysis and wheezing.   Cardiovascular: Negative for chest pain and leg swelling.  Gastrointestinal: Negative for abdominal pain, constipation, diarrhea, nausea and vomiting.  Genitourinary: Negative for bladder incontinence, difficulty urinating, dysuria, frequency and hematuria.   Musculoskeletal: Negative for back pain, gait problem, neck pain and neck stiffness.  Skin: Improved shingles.  Neurological: Positive for generalized weakness.  Negative for extremity weakness, gait problem, headaches,  and seizures.  Hematological: Negative for adenopathy. Does not bruise/bleed easily.  Psychiatric/Behavioral: Negative for confusion, depression and sleep disturbance. The patient is not nervous/anxious.       PHYSICAL EXAMINATION:  Blood pressure 97/68, pulse (!) 117, temperature 98 F (36.7 C), temperature source Temporal, resp. rate 16, weight 113 lb 4.8 oz (51.4 kg), SpO2 97%.  ECOG PERFORMANCE STATUS: 2  Physical Exam  Constitutional: Oriented to person, place, and time and thin appearing male and in no distress.   HENT:  Head: Normocephalic and atraumatic.  Mouth/Throat: Oropharynx is clear and moist. No oropharyngeal exudate.  Eyes: Conjunctivae are normal. Right eye exhibits no discharge. Left eye exhibits no discharge. No scleral icterus.  Neck: Normal range of motion. Neck supple.  Cardiovascular: Tachycardic, regular rhythm, normal heart sounds and intact distal pulses.   Pulmonary/Chest: Effort normal and breath sounds normal. No respiratory distress. No wheezes. No rales.  Abdominal: Soft. Bowel sounds are normal. Exhibits no distension and no mass. There is no tenderness.  Musculoskeletal: Normal range of motion. Exhibits no edema.   Lymphadenopathy:    No cervical adenopathy.  Neurological: Alert and oriented to person, place, and time. Exhibits muscle wasting. Gait normal and coordination normal.  Skin: Skin is warm and dry.  Not diaphoretic. No erythema. No pallor.  Psychiatric: Mood, memory and judgment normal.  Vitals reviewed.Psychiatric: Mood, memory and judgment normal.  Vitals reviewed.  LABORATORY DATA: Lab Results  Component Value Date   WBC 7.0 08/06/2023   HGB 9.5 (L) 08/06/2023   HCT 27.3 (L) 08/06/2023   MCV 93.8 08/06/2023   PLT 102 (L) 08/06/2023      Chemistry      Component Value Date/Time   NA 125 (L) 08/06/2023 0811   K 4.3 08/06/2023 0811   CL 92 (L) 08/06/2023 0811   CO2 28 08/06/2023 0811   BUN 12 08/06/2023 0811   CREATININE 0.86 08/06/2023 0811      Component Value Date/Time   CALCIUM 8.8 (L) 08/06/2023 4098  ALKPHOS 144 (H) 08/06/2023 0811   AST 13 (L) 08/06/2023 0811   ALT 5 08/06/2023 0811   BILITOT 0.4 08/06/2023 0811       RADIOGRAPHIC STUDIES:  DG Chest 2 View Result Date: 07/16/2023 CLINICAL DATA:  Cough.  History of lung cancer. EXAM: CHEST - 2 VIEW COMPARISON:  November 29, 2021.  June 01, 2023. FINDINGS: Stable cardiomediastinal silhouette. Right internal jugular Port-A-Cath is noted. Left perihilar opacity is noted which is decreased compared to prior exam suggesting improving atelectasis or infiltrate with residual postoperative scarring. Stable left basilar scarring is noted. Mediastinal shift to the left is noted secondary to volume loss with subsequent hyperexpansion of the right lung. Bony thorax is unremarkable. IMPRESSION: Decreased left perihilar opacity as noted above. Stable left basilar scarring is noted. Electronically Signed   By: Rosalene Colon M.D.   On: 07/16/2023 13:07     ASSESSMENT/PLAN:  This is a very pleasant 78 year old Caucasian male with stage IV (T3, N1, M1 B) non-small cell lung cancer, favoring squamous cell carcinoma.  The  patient presented with a superior segment left upper lobe lung mass with invasion of the chest and destruction of the posterior left sixth and seventh ribs as well as left hilar adenopathy.  The patient has suspicious pleural metastases.  The patient was diagnosed in March 2023.    The patient underwent palliative radiation to left lower lobe lung mass and chest wall lesion under the care of Dr. Eloise Hake.   He then underwent systemic chemotherapy with carboplatin  for an AUC 5, paclitaxel  175 mg per metered squared, Libtayo  350 mg IV every 3 weeks.  He completed 4 cycles this was started on 07/18/2021.     He then started maintenance treatment with single agent Libtayo  350 mg IV every 3 weeks starting 10/10/2021.  Cycle 3 was delayed due to significant fatigue and weakness. This was discontinued due to suspicious colitis and diarrhea.    He is also status post palliative radiotherapy to the progressive disease in the right adrenal gland as well as the left chest under the care of Dr. Eloise Hake completed October 24, 2022.     The scan showed progression with progressive RML mass and LLL/posterior perihilar lesion suggesting additional site of recurrence in December.     Dr. Marguerita Shih recommends assuming his systemic treatment.  Since the patient had significant intolerance to immunotherapy in the past with colitis, Dr. Marguerita Shih is going to recommend resuming chemotherapy with carboplatin  for an AUC of 5 and paclitaxel  175 mg/m for 4-6 cycles.  If he has good tolerance we may go up to 6 cycles. He is status post 5 cycles.   At his last appointment for cycle #5, his dose of chemotherapy was reduced to carboplatin  for an AUC of 4 and paclitaxel  150 mg/m2.   The patient was seen with Dr. Marguerita Shih. Due to his worsening fatigue, generalized weakness, weight loss, tachycardia, and hypotension, we will skip his last cycle of treatment with cycle #6.   - Administer IV fluids for hydration. - Order a scan in one month to  reassess condition.  Fatigue Persistent fatigue related to cancer treatment. Treatment paused for recovery and energy improvement. - Pause cancer treatment for recovery and energy improvement.  Hyponatremia Sodium levels low at 125, receiving salt tablets. Kidney function good. Continued monitoring needed. - Continue monitoring sodium levels. - Continue salt tablets as needed.   Weight loss and poor appetite Weight loss and poor appetite with minimal nutritional intake. Encouraged  homemade protein drinks to support recovery. - Encourage nutritional supplements like Boost. - Suggest homemade protein drinks with protein powder.   I will arrange for a restaging CT of the CAP prior to his next cycle of treatment.     The patient was advised to call immediately if he has any concerning symptoms in the interval. The patient voices understanding of current disease status and treatment options and is in agreement with the current care plan. All questions were answered. The patient knows to call the clinic with any problems, questions or concerns. We can certainly see the patient much sooner if necessary    Orders Placed This Encounter  Procedures   CT CHEST ABDOMEN PELVIS W CONTRAST    Standing Status:   Future    Expected Date:   08/27/2023    Expiration Date:   08/05/2024    If indicated for the ordered procedure, I authorize the administration of contrast media per Radiology protocol:   Yes    Does the patient have a contrast media/X-ray dye allergy?:   No    Preferred imaging location?:   [facility]    If indicated for the ordered procedure, I authorize the administration of oral contrast media per Radiology protocol:   Yes      Staphany Ditton L Dona Klemann, PA-C 08/06/23  ADDENDUM: Hematology/Oncology Attending: I had a face-to-face encounter with the patient today.  I reviewed his record, lab, and recommended his care plan.  This is a very pleasant 78 years old white  male with a stage IV non-small cell lung cancer favoring squamous cell carcinoma diagnosed in March 2023.  He is status post palliative radiotherapy to the left lower lobe lung mass that was invading the chest wall followed by palliative systemic chemoimmunotherapy with carboplatin , paclitaxel  and Libtayo  (Cempilimab) for 4 cycles followed by maintenance treatment with Libtayo  (Cempilimab) every 3 weeks status post additional series cycle discontinued secondary to highly suspicious immunotherapy mediated colitis and persistent diarrhea.  The patient had evidence for disease recurrence and metastasis to the right adrenal gland as well as the left chest wall treated with radiation.  He then resumed systemic chemotherapy with reduced dose carboplatin  and paclitaxel  status post 5 cycles.  He has a rough time with his systemic chemotherapy with increasing fatigue and weakness as well as lack of appetite, dehydration and weight loss. I recommended for the patient to stop his treatment with systemic chemotherapy at this point. We will arrange for him to have repeat CT scan of the chest, abdomen and pelvis in few weeks and he will have follow-up visit after the scan. For the dehydration we will arrange for the patient to receive IV normal saline today. The patient will come back for follow-up visit in around 1 months. He was advised to call immediately if he has any other concerning symptoms in the interval. The total time spent in the appointment was 30 minutes. Disclaimer: This note was dictated with voice recognition software. Similar sounding words can inadvertently be transcribed and may be missed upon review.

## 2023-08-03 ENCOUNTER — Other Ambulatory Visit: Payer: Medicare Other

## 2023-08-03 ENCOUNTER — Inpatient Hospital Stay

## 2023-08-04 ENCOUNTER — Encounter: Payer: Self-pay | Admitting: Physician Assistant

## 2023-08-04 ENCOUNTER — Encounter: Payer: Self-pay | Admitting: Internal Medicine

## 2023-08-05 MED FILL — Fosaprepitant Dimeglumine For IV Infusion 150 MG (Base Eq): INTRAVENOUS | Qty: 5 | Status: AC

## 2023-08-06 ENCOUNTER — Inpatient Hospital Stay: Admitting: Nutrition

## 2023-08-06 ENCOUNTER — Inpatient Hospital Stay: Attending: Internal Medicine

## 2023-08-06 ENCOUNTER — Inpatient Hospital Stay

## 2023-08-06 ENCOUNTER — Inpatient Hospital Stay (HOSPITAL_BASED_OUTPATIENT_CLINIC_OR_DEPARTMENT_OTHER): Attending: Internal Medicine | Admitting: Physician Assistant

## 2023-08-06 VITALS — BP 98/62 | HR 98 | Resp 17

## 2023-08-06 VITALS — BP 97/68 | HR 117 | Temp 98.0°F | Resp 16 | Wt 113.3 lb

## 2023-08-06 DIAGNOSIS — I959 Hypotension, unspecified: Secondary | ICD-10-CM | POA: Insufficient documentation

## 2023-08-06 DIAGNOSIS — R634 Abnormal weight loss: Secondary | ICD-10-CM | POA: Insufficient documentation

## 2023-08-06 DIAGNOSIS — E860 Dehydration: Secondary | ICD-10-CM | POA: Diagnosis not present

## 2023-08-06 DIAGNOSIS — C3492 Malignant neoplasm of unspecified part of left bronchus or lung: Secondary | ICD-10-CM

## 2023-08-06 DIAGNOSIS — C7951 Secondary malignant neoplasm of bone: Secondary | ICD-10-CM | POA: Insufficient documentation

## 2023-08-06 DIAGNOSIS — Z95828 Presence of other vascular implants and grafts: Secondary | ICD-10-CM

## 2023-08-06 DIAGNOSIS — C3432 Malignant neoplasm of lower lobe, left bronchus or lung: Secondary | ICD-10-CM | POA: Diagnosis not present

## 2023-08-06 DIAGNOSIS — E871 Hypo-osmolality and hyponatremia: Secondary | ICD-10-CM | POA: Diagnosis not present

## 2023-08-06 LAB — CBC WITH DIFFERENTIAL (CANCER CENTER ONLY)
Abs Immature Granulocytes: 0.12 10*3/uL — ABNORMAL HIGH (ref 0.00–0.07)
Basophils Absolute: 0.1 10*3/uL (ref 0.0–0.1)
Basophils Relative: 1 %
Eosinophils Absolute: 0 10*3/uL (ref 0.0–0.5)
Eosinophils Relative: 0 %
HCT: 27.3 % — ABNORMAL LOW (ref 39.0–52.0)
Hemoglobin: 9.5 g/dL — ABNORMAL LOW (ref 13.0–17.0)
Immature Granulocytes: 2 %
Lymphocytes Relative: 4 %
Lymphs Abs: 0.3 10*3/uL — ABNORMAL LOW (ref 0.7–4.0)
MCH: 32.6 pg (ref 26.0–34.0)
MCHC: 34.8 g/dL (ref 30.0–36.0)
MCV: 93.8 fL (ref 80.0–100.0)
Monocytes Absolute: 0.6 10*3/uL (ref 0.1–1.0)
Monocytes Relative: 9 %
Neutro Abs: 5.9 10*3/uL (ref 1.7–7.7)
Neutrophils Relative %: 84 %
Platelet Count: 102 10*3/uL — ABNORMAL LOW (ref 150–400)
RBC: 2.91 MIL/uL — ABNORMAL LOW (ref 4.22–5.81)
RDW: 14.6 % (ref 11.5–15.5)
WBC Count: 7 10*3/uL (ref 4.0–10.5)
nRBC: 0 % (ref 0.0–0.2)

## 2023-08-06 LAB — CMP (CANCER CENTER ONLY)
ALT: 5 U/L (ref 0–44)
AST: 13 U/L — ABNORMAL LOW (ref 15–41)
Albumin: 3.1 g/dL — ABNORMAL LOW (ref 3.5–5.0)
Alkaline Phosphatase: 144 U/L — ABNORMAL HIGH (ref 38–126)
Anion gap: 5 (ref 5–15)
BUN: 12 mg/dL (ref 8–23)
CO2: 28 mmol/L (ref 22–32)
Calcium: 8.8 mg/dL — ABNORMAL LOW (ref 8.9–10.3)
Chloride: 92 mmol/L — ABNORMAL LOW (ref 98–111)
Creatinine: 0.86 mg/dL (ref 0.61–1.24)
GFR, Estimated: >60 mL/min (ref >60)
Glucose, Bld: 90 mg/dL (ref 70–99)
Potassium: 4.3 mmol/L (ref 3.5–5.1)
Sodium: 125 mmol/L — ABNORMAL LOW (ref 135–145)
Total Bilirubin: 0.4 mg/dL (ref 0.0–1.2)
Total Protein: 6.4 g/dL — ABNORMAL LOW (ref 6.5–8.1)

## 2023-08-06 LAB — SAMPLE TO BLOOD BANK

## 2023-08-06 LAB — ABO/RH: ABO/RH(D): O NEG

## 2023-08-06 MED ORDER — SODIUM CHLORIDE 0.9 % IV SOLN: INTRAVENOUS | Status: DC

## 2023-08-06 MED ORDER — SODIUM CHLORIDE 0.9% FLUSH
10.0000 mL | Freq: Once | INTRAVENOUS | Status: AC
  Administered 2023-08-06: 10 mL

## 2023-08-06 MED ORDER — HEPARIN SOD (PORK) LOCK FLUSH 100 UNIT/ML IV SOLN
500.0000 [IU] | Freq: Once | INTRAVENOUS | Status: AC
  Administered 2023-08-06: 500 [IU]

## 2023-08-06 NOTE — Progress Notes (Signed)
 Nutrition follow up completed with patient and wife in infusion room. Treatment was cancelled and he will receive IVF. Patient diagnosed with NSCLC March 2023 and followed by Dr. Marguerita Shih.  Weight: 113 pounds 4.8 oz May 1. Patient underweight with BMI 14.75 117 pounds April 10 124 pounds March 11  8% weight loss over 3 months. Noted severe fat and muscle loss  Severe malnutrition as evidenced by % weight loss, severe muscle and fat loss.  Labs include Na 125, Albumin 3.1  Reports he feels weak and has no appetite. He cannot even think of something he wants to eat. Reports it is difficult to swallow sandwiches due to dry mouth. Reports taste alterations. This is described as food tasting metallic. He is drinking Boost high protein once daily. He does not like Ensure. He finds it easier to drink calories then chew and swallow calories.  Nutrition Diagnosis: Unintended weight loss continues  Intervention: Educated on importance of increased calories and protein in small amounts throughout the day. Increase Boost High Protein to Boost Plus and try to drink minimum of 3 daily. Educated on strategies to improve taste alterations, including baking soda and salt water gargles. Provided nutrition fact sheet. Encouraged soft moist protein foods, gravies with meats, sauces for dipping, etc. Provided contact information for questions and concerns.  Monitoring, Evaluation, Goals: Increase calories and protein to minimize weight loss.  Next Visit: To be scheduled as needed with upcoming visits.

## 2023-08-08 ENCOUNTER — Inpatient Hospital Stay

## 2023-08-10 ENCOUNTER — Telehealth: Payer: Self-pay | Admitting: Medical Oncology

## 2023-08-10 ENCOUNTER — Other Ambulatory Visit: Payer: Self-pay | Admitting: Medical Oncology

## 2023-08-10 ENCOUNTER — Inpatient Hospital Stay

## 2023-08-10 DIAGNOSIS — E860 Dehydration: Secondary | ICD-10-CM

## 2023-08-10 NOTE — Telephone Encounter (Signed)
 Wife confirmed IVF tomorrow.

## 2023-08-10 NOTE — Telephone Encounter (Addendum)
 Spoke with patient who reports feeling very weak. His voice sounds weak.States he requires support from walls or assistance from his wife when ambulating. Reports minimal oral intake over the past three days, consisting of soup, 4 crackers,  steak and cheese hoagie, and approximately 16 oz of fluids daily. Today, he consumed 2 cups of coffee and some fruit. Denies any episodes of diarrhea or vomiting. Patient also reports waking multiple times during the night with significant thirst and states he drinks fluids each time he wakes.

## 2023-08-11 ENCOUNTER — Inpatient Hospital Stay

## 2023-08-11 VITALS — BP 104/70 | HR 100 | Temp 97.8°F | Resp 20

## 2023-08-11 DIAGNOSIS — I959 Hypotension, unspecified: Secondary | ICD-10-CM | POA: Diagnosis not present

## 2023-08-11 DIAGNOSIS — Z95828 Presence of other vascular implants and grafts: Secondary | ICD-10-CM

## 2023-08-11 DIAGNOSIS — R634 Abnormal weight loss: Secondary | ICD-10-CM | POA: Diagnosis not present

## 2023-08-11 DIAGNOSIS — E871 Hypo-osmolality and hyponatremia: Secondary | ICD-10-CM | POA: Diagnosis not present

## 2023-08-11 DIAGNOSIS — E860 Dehydration: Secondary | ICD-10-CM | POA: Diagnosis not present

## 2023-08-11 DIAGNOSIS — C7951 Secondary malignant neoplasm of bone: Secondary | ICD-10-CM | POA: Diagnosis not present

## 2023-08-11 DIAGNOSIS — C3432 Malignant neoplasm of lower lobe, left bronchus or lung: Secondary | ICD-10-CM | POA: Diagnosis not present

## 2023-08-11 MED ORDER — SODIUM CHLORIDE 0.9 % IV SOLN: INTRAVENOUS | Status: DC

## 2023-08-11 MED ORDER — HEPARIN SOD (PORK) LOCK FLUSH 100 UNIT/ML IV SOLN
500.0000 [IU] | Freq: Once | INTRAVENOUS | Status: AC
Start: 2023-08-11 — End: 2023-08-11
  Administered 2023-08-11: 500 [IU]

## 2023-08-11 MED ORDER — SODIUM CHLORIDE 0.9% FLUSH
10.0000 mL | Freq: Once | INTRAVENOUS | Status: AC
Start: 2023-08-11 — End: 2023-08-11
  Administered 2023-08-11: 10 mL

## 2023-08-13 ENCOUNTER — Inpatient Hospital Stay

## 2023-08-17 ENCOUNTER — Inpatient Hospital Stay

## 2023-08-20 ENCOUNTER — Inpatient Hospital Stay

## 2023-08-24 ENCOUNTER — Inpatient Hospital Stay

## 2023-08-25 ENCOUNTER — Emergency Department (HOSPITAL_COMMUNITY)
Admission: EM | Admit: 2023-08-25 | Discharge: 2023-09-06 | Disposition: E | Attending: Emergency Medicine | Admitting: Emergency Medicine

## 2023-08-25 ENCOUNTER — Other Ambulatory Visit: Payer: Self-pay

## 2023-08-25 ENCOUNTER — Emergency Department (HOSPITAL_COMMUNITY)

## 2023-08-25 ENCOUNTER — Encounter (HOSPITAL_COMMUNITY): Payer: Self-pay | Admitting: *Deleted

## 2023-08-25 ENCOUNTER — Encounter: Payer: Self-pay | Admitting: Internal Medicine

## 2023-08-25 ENCOUNTER — Encounter: Payer: Self-pay | Admitting: Physician Assistant

## 2023-08-25 DIAGNOSIS — Z23 Encounter for immunization: Secondary | ICD-10-CM | POA: Diagnosis not present

## 2023-08-25 DIAGNOSIS — S0990XA Unspecified injury of head, initial encounter: Secondary | ICD-10-CM

## 2023-08-25 DIAGNOSIS — S51812A Laceration without foreign body of left forearm, initial encounter: Secondary | ICD-10-CM | POA: Insufficient documentation

## 2023-08-25 DIAGNOSIS — I469 Cardiac arrest, cause unspecified: Secondary | ICD-10-CM | POA: Insufficient documentation

## 2023-08-25 DIAGNOSIS — C3490 Malignant neoplasm of unspecified part of unspecified bronchus or lung: Secondary | ICD-10-CM | POA: Insufficient documentation

## 2023-08-25 DIAGNOSIS — W010XXA Fall on same level from slipping, tripping and stumbling without subsequent striking against object, initial encounter: Secondary | ICD-10-CM | POA: Diagnosis not present

## 2023-08-25 DIAGNOSIS — S0181XA Laceration without foreign body of other part of head, initial encounter: Secondary | ICD-10-CM | POA: Insufficient documentation

## 2023-08-25 DIAGNOSIS — W19XXXA Unspecified fall, initial encounter: Secondary | ICD-10-CM

## 2023-08-25 LAB — CBC WITH DIFFERENTIAL/PLATELET
Abs Immature Granulocytes: 0.03 10*3/uL (ref 0.00–0.07)
Basophils Absolute: 0 10*3/uL (ref 0.0–0.1)
Basophils Relative: 0 %
Eosinophils Absolute: 0 10*3/uL (ref 0.0–0.5)
Eosinophils Relative: 0 %
HCT: 25.8 % — ABNORMAL LOW (ref 39.0–52.0)
Hemoglobin: 8.6 g/dL — ABNORMAL LOW (ref 13.0–17.0)
Immature Granulocytes: 0 %
Lymphocytes Relative: 6 %
Lymphs Abs: 0.5 10*3/uL — ABNORMAL LOW (ref 0.7–4.0)
MCH: 33.1 pg (ref 26.0–34.0)
MCHC: 33.3 g/dL (ref 30.0–36.0)
MCV: 99.2 fL (ref 80.0–100.0)
Monocytes Absolute: 0.3 10*3/uL (ref 0.1–1.0)
Monocytes Relative: 3 %
Neutro Abs: 6.8 10*3/uL (ref 1.7–7.7)
Neutrophils Relative %: 91 %
Platelets: 94 10*3/uL — ABNORMAL LOW (ref 150–400)
RBC: 2.6 MIL/uL — ABNORMAL LOW (ref 4.22–5.81)
RDW: 14 % (ref 11.5–15.5)
WBC: 7.6 10*3/uL (ref 4.0–10.5)
nRBC: 0 % (ref 0.0–0.2)

## 2023-08-25 LAB — COMPREHENSIVE METABOLIC PANEL WITH GFR
ALT: 10 U/L (ref 0–44)
AST: 19 U/L (ref 15–41)
Albumin: 1.9 g/dL — ABNORMAL LOW (ref 3.5–5.0)
Alkaline Phosphatase: 98 U/L (ref 38–126)
Anion gap: 10 (ref 5–15)
BUN: 10 mg/dL (ref 8–23)
CO2: 21 mmol/L — ABNORMAL LOW (ref 22–32)
Calcium: 8.3 mg/dL — ABNORMAL LOW (ref 8.9–10.3)
Chloride: 88 mmol/L — ABNORMAL LOW (ref 98–111)
Creatinine, Ser: 0.66 mg/dL (ref 0.61–1.24)
GFR, Estimated: >60 mL/min (ref >60)
Glucose, Bld: 71 mg/dL (ref 70–99)
Potassium: 3.9 mmol/L (ref 3.5–5.1)
Sodium: 119 mmol/L — CL (ref 135–145)
Total Bilirubin: 0.5 mg/dL (ref 0.0–1.2)
Total Protein: 4.6 g/dL — ABNORMAL LOW (ref 6.5–8.1)

## 2023-08-25 LAB — I-STAT CHEM 8, ED
BUN: 9 mg/dL (ref 8–23)
Calcium, Ion: 1.19 mmol/L (ref 1.15–1.40)
Chloride: 89 mmol/L — ABNORMAL LOW (ref 98–111)
Creatinine, Ser: 0.7 mg/dL (ref 0.61–1.24)
Glucose, Bld: 67 mg/dL — ABNORMAL LOW (ref 70–99)
HCT: 26 % — ABNORMAL LOW (ref 39.0–52.0)
Hemoglobin: 8.8 g/dL — ABNORMAL LOW (ref 13.0–17.0)
Potassium: 4.1 mmol/L (ref 3.5–5.1)
Sodium: 122 mmol/L — ABNORMAL LOW (ref 135–145)
TCO2: 18 mmol/L — ABNORMAL LOW (ref 22–32)

## 2023-08-25 MED ORDER — NOREPINEPHRINE 4 MG/250ML-% IV SOLN
INTRAVENOUS | Status: AC
Start: 2023-08-25 — End: 2023-08-25
  Administered 2023-08-25: 40 ug/kg/min
  Filled 2023-08-25: qty 250

## 2023-08-25 MED ORDER — LACTATED RINGERS IV BOLUS
1000.0000 mL | Freq: Once | INTRAVENOUS | Status: AC
  Administered 2023-08-25: 1000 mL via INTRAVENOUS

## 2023-08-25 MED ORDER — TETANUS-DIPHTH-ACELL PERTUSSIS 5-2.5-18.5 LF-MCG/0.5 IM SUSY
0.5000 mL | PREFILLED_SYRINGE | Freq: Once | INTRAMUSCULAR | Status: AC
  Administered 2023-08-25: 0.5 mL via INTRAMUSCULAR
  Filled 2023-08-25: qty 0.5

## 2023-08-25 MED ORDER — FENTANYL CITRATE PF 50 MCG/ML IJ SOSY
PREFILLED_SYRINGE | INTRAMUSCULAR | Status: AC
  Filled 2023-08-25: qty 1

## 2023-08-25 NOTE — ED Notes (Signed)
 EDP spoke with family. Pt is now a DNR

## 2023-08-25 NOTE — ED Notes (Signed)
 CPR started.

## 2023-08-25 NOTE — ED Notes (Signed)
 Lab called. Made them aware that RN was unable to obtained labs.

## 2023-08-25 NOTE — ED Notes (Signed)
 Family at bedside.

## 2023-08-25 NOTE — ED Notes (Signed)
 Awaiting call back from Medical Examiner.

## 2023-08-25 NOTE — ED Notes (Signed)
 RN explained pt will be getting contrast. family stated he had a port but was going to have it used soon so okay with starting peripheral.

## 2023-08-25 NOTE — ED Notes (Signed)
CPR stopped

## 2023-08-25 NOTE — Progress Notes (Signed)
   September 09, 2023 1941  Spiritual Encounters  Type of Visit Initial  Referral source Nurse (RN/NT/LPN)  Reason for visit Patient death  OnCall Visit Yes   Chaplain responded to a call for family support due to the death of the patient, Edward Beck.  The  family left before I arrived.    Clarence Croak [facility]  [phone]

## 2023-08-25 NOTE — ED Triage Notes (Addendum)
 Pt fell on the concrete porch, lac left eyebrow and wound to left elbow. Unwitnessed fall. Pt's wife states pt is not on blood thinners.  Pt has cancer and is on chemo.   Wife states pt's BP is usually low, noted 81/57 in triage.

## 2023-08-25 NOTE — ED Notes (Signed)
 Pt transported to morgue via security.

## 2023-08-25 NOTE — ED Provider Notes (Addendum)
 Dr. Efraim Grange is primary ER physician for this patient.  Patient was critically ill and requiring his full attention so I had goals of care discussion with the patient's family.  I discussed with his wife Nolton Denis.  Patient has been sick for some time and has stage IV lung cancer, has been getting worse and more short of breath over the past few days.  She has noticed that he has been very weak and tired lately. He has also been having hemoptysis and shortness of breath. Recently he has also been dehydrated and not able to complete chemotherapy.  Orelia Binet has not had any formal discussion with the patient about goals of care or CODE STATUS.  She believes that he would not want to be kept alive by tubes or pumps or supported by breathing machines.  We discussed the patient's general decline recently and given his frailty, if his heart were to stop beating she does not want us  does not perform CPR.  She also does not think intubation would be in his goals of care and would also like him to be DNI.  Given his underlying illness, I believe this is a very reasonable decision.  I think he would be unlikely to survive an ICU stay.  She would like us  to continue medical care and resuscitate him best as we can.  I discussed this with Dr. Efraim Grange.   Mordecai Applebaum, MD 09-09-23 [id number]    Mordecai Applebaum, MD 09-09-2023 [phone]

## 2023-08-25 NOTE — ED Notes (Signed)
 CPR ended. Pulse present. Levo started

## 2023-08-25 NOTE — ED Provider Notes (Signed)
 [place] EMERGENCY DEPARTMENT AT [facility] Provider Note   CSN: [id number] Arrival date & time: 09-04-23  1232     History  Chief Complaint  Patient presents with   Edward Beck is a 78 y.o. male.  78 year old male with a history of stage IV non-small cell lung cancer who presents emergency department after a fall.  Patient was outside trying to get his walker when he had a mechanical fall.  Did hit his head.  Was not on the ground for prolonged period of time.  Did hurt his left temple.  No LOC.  Not on blood thinners.  Wife also reports she is concerned because he has been coughing up blood for the past few days as well.  Has been ambulatory since the fall.       Home Medications Prior to Admission medications   Medication Sig Start Date End Date Taking? Authorizing Provider  benzonatate  (TESSALON ) 100 MG capsule Take 1 capsule (100 mg total) by mouth 3 (three) times daily as needed. Patient not taking: Reported on 08/06/2023 04/27/23   Heilingoetter, Cassandra L, PA-C  Cyanocobalamin  (B-12) 1000 MCG TABS Take 1 tablet by mouth daily. Patient not taking: Reported on 08/06/2023    [provider]  diphenoxylate -atropine  (LOMOTIL ) 2.5-0.025 MG tablet TAKE 1 TABLET BY MOUTH FOUR TIMES DAILY AS NEEDED FOR DIARRHEA OR LOOSE STOOLS Patient not taking: Reported on 08/06/2023 01/19/23   Heilingoetter, Cassandra L, PA-C  Fexofenadine HCl (MUCINEX ALLERGY PO) Take by mouth.    [provider]  finasteride  (PROSCAR ) 5 MG tablet Take 1 tablet (5 mg total) by mouth daily. 08/22/22   McKenzie, Arden Beck, MD  gabapentin  (NEURONTIN ) 100 MG capsule Take 1 capsule (100 mg total) by mouth 3 (three) times daily. Patient not taking: Reported on 08/06/2023 10/30/21   Heilingoetter, Cassandra L, PA-C  lidocaine -prilocaine  (EMLA ) cream Apply to the Port-A-Cath site 30 minutes before chemotherapy. 07/11/21   Marlene Simas, MD  loratadine (CLARITIN) 10 MG tablet Take 10  mg by mouth daily.    [provider]  methylPREDNISolone  (MEDROL  DOSEPAK) 4 MG TBPK tablet Use as instructed Patient not taking: Reported on 08/06/2023 07/07/23   Heilingoetter, Cassandra L, PA-C  mirtazapine  (REMERON ) 15 MG tablet Take 1 tablet (15 mg total) by mouth at bedtime. Patient not taking: Reported on 08/06/2023 12/02/21   Heilingoetter, Cassandra L, PA-C  omeprazole  (PRILOSEC) 20 MG capsule Take 1 capsule (20 mg total) by mouth daily. Patient not taking: Reported on 08/06/2023 05/22/22   Heilingoetter, Cassandra L, PA-C  potassium chloride  SA (KLOR-CON  M) 20 MEQ tablet Take 1 tablet (20 mEq total) by mouth 2 (two) times daily. Patient not taking: Reported on 08/06/2023 05/22/22   Heilingoetter, Cassandra L, PA-C  prochlorperazine  (COMPAZINE ) 10 MG tablet Take 1 tablet (10 mg total) by mouth every 6 (six) hours as needed for nausea or vomiting. 07/11/21   Marlene Simas, MD  sodium chloride  1 g tablet Take 1 tablet (1 g total) by mouth 2 (two) times daily with a meal. Patient not taking: Reported on 08/06/2023 07/16/23   Heilingoetter, Cassandra L, PA-C  triamcinolone cream (KENALOG) 0.1 % Apply 1 application  topically 2 (two) times daily as needed (dry skin).    [provider]  umeclidinium-vilanterol (ANORO ELLIPTA ) 62.5-25 MCG/ACT AEPB Inhale 1 puff into the lungs daily. 07/10/21   Prudy Brownie, DO      Allergies    Tamiflu [oseltamivir phosphate]  Review of Systems   Review of Systems  Physical Exam Updated Vital Signs BP (!) 80/62   Pulse (!) 50   Temp (!) 96 F (35.6 C)   Resp 19   Ht 6\' 1"  (1.854 m)   Wt 51.3 kg   SpO2 (!) 48%   BMI 14.91 kg/m  Physical Exam Constitutional:      General: He is not in acute distress.    Appearance: Normal appearance. He is not ill-appearing.  HENT:     Head: Normocephalic.     Comments: Abrasion to left temple    Right Ear: External ear normal.     Left Ear: External ear normal.     Mouth/Throat:     Mouth: Mucous  membranes are moist.     Pharynx: Oropharynx is clear.  Eyes:     Extraocular Movements: Extraocular movements intact.     Conjunctiva/sclera: Conjunctivae normal.     Pupils: Pupils are equal, round, and reactive to light.  Neck:     Comments: No C-spine midline tenderness to palpation Cardiovascular:     Rate and Rhythm: Normal rate and regular rhythm.     Pulses: Normal pulses.     Heart sounds: Normal heart sounds.  Pulmonary:     Effort: Pulmonary effort is normal. No respiratory distress.     Comments: Clear to auscultation anteriorly.  On 2 L nasal cannula. Abdominal:     General: Abdomen is flat. There is no distension.     Palpations: Abdomen is soft. There is no mass.     Tenderness: There is no abdominal tenderness. There is no guarding.  Musculoskeletal:        General: No deformity. Normal range of motion.     Cervical back: No rigidity or tenderness.     Comments: No tenderness to palpation of midline thoracic or lumbar spine.  No step-offs palpated.  No tenderness to palpation of chest wall.  No bruising noted.  No tenderness to palpation of bilateral clavicles.  No tenderness to palpation, bruising, or deformities noted of bilateral shoulders, elbows, wrists, hips, knees, or ankles.  Skin:    Comments: Small skin tear of left temple and left forearm  Neurological:     General: No focal deficit present.     Mental Status: He is alert and oriented to person, place, and time. Mental status is at baseline.     Cranial Nerves: No cranial nerve deficit.     Sensory: No sensory deficit.     Motor: No weakness.     ED Results / Procedures / Treatments   Labs (all labs ordered are listed, but only abnormal results are displayed) Labs Reviewed  CBC WITH DIFFERENTIAL/PLATELET - Abnormal; Notable for the following components:      Result Value   RBC 2.60 (*)    Hemoglobin 8.6 (*)    HCT 25.8 (*)    Platelets 94 (*)    Lymphs Abs 0.5 (*)    All other components  within normal limits  I-STAT CHEM 8, ED - Abnormal; Notable for the following components:   Sodium 122 (*)    Chloride 89 (*)    Glucose, Bld 67 (*)    TCO2 18 (*)    Hemoglobin 8.8 (*)    HCT 26.0 (*)    All other components within normal limits  COMPREHENSIVE METABOLIC PANEL WITH GFR    EKG None  Radiology No results found.  Procedures Procedures    Medications Ordered  in ED Medications  Tdap (BOOSTRIX) injection 0.5 mL (0.5 mLs Intramuscular Given September 12, 2023 1655)  lactated ringers  bolus 1,000 mL (0 mLs Intravenous Stopped 12-Sep-2023 1846)  norepinephrine (LEVOPHED) 4-5 MG/250ML-% infusion SOLN (40 mcg/kg/min  New Bag/Given September 12, 2023 1815)  fentaNYL  (SUBLIMAZE ) 50 MCG/ML injection (  Given Sep 12, 2023 1819)    ED Course/ Medical Decision Making/ A&P                                 Medical Decision Making Amount and/or Complexity of Data Reviewed Labs: ordered. Radiology: ordered.  Risk Prescription drug management.   Edward Beck is a 78 y.o. male with comorbidities that complicate the patient evaluation including stage IV lung cancer who presents emergency department after a fall  Initial Ddx:  TBI, C-spine injury, pneumonia, malignancy, PE  MDM/Course:  Patient presents to the emergency department after having a fall.  Was unwitnessed but his wife reports he was not down for a long time.  They suspected it was mechanical.  They do report that he has been having him some hemoptysis over the past few days as well.  Initially was hypotensive but his blood pressure improved without any sort of intervention.  Was given some IV fluids.  Tetanus shot was updated since it has been since 2017 since he last had it.  IV access was obtained but there are some issues obtaining blood work and while he was awaiting his CT scan the patient became hypoxic and bradycardic.  400 mc of epinephrine  was pushed with some improvement of his vital signs but unfortunately he continued to decline  and went into cardiac arrest.  Nursing initially reported that he was full code so compressions were initiated and the other physician did discuss CODE STATUS with the family.  He was in PEA.  They wish to make him DNR/DNI and feel this is what is in his best interest especially given his metastatic cancer.  We did obtain ROSC after 1 round of compressions and code dose epi.  He was placed on a nonrebreather and was given 50 mcg of fentanyl  since several ribs were broken during CPR.  He expired shortly afterwards at 1846.  Do not feel this is a medical examiner case.  Patient will be taken the morgue shortly.  This patient presents to the ED for concern of complaints listed in HPI, this involves an extensive number of treatment options, and is a complaint that carries with it a high risk of complications and morbidity. Disposition including potential need for admission considered.   Dispo: DC Home. Return precautions discussed including, but not limited to, those listed in the AVS. Allowed pt time to ask questions which were answered fully prior to dc.  Additional history obtained from spouse Records reviewed Outpatient Clinic Notes I personally reviewed and interpreted cardiac monitoring: normal sinus rhythm  I have reviewed the patients home medications and made adjustments as needed Social Determinants of health:  Geriatric  Portions of this note were generated with Scientist, clinical (histocompatibility and immunogenetics). Dictation errors may occur despite best attempts at proofreading.    CRITICAL CARE Performed by: Ninetta Basket   Total critical care time: 30 minutes  Critical care time was exclusive of separately billable procedures and treating other patients.  Critical care was necessary to treat or prevent imminent or life-threatening deterioration.  Critical care was time spent personally by me on the following activities: development of treatment plan  with patient and/or surrogate as well as nursing,  discussions with consultants, evaluation of patient's response to treatment, examination of patient, obtaining history from patient or surrogate, ordering and performing treatments and interventions, ordering and review of laboratory studies, ordering and review of radiographic studies, pulse oximetry and re-evaluation of patient's condition.   Final Clinical Impression(s) / ED Diagnoses Final diagnoses:  Fall, initial encounter  Injury of head, initial encounter  Cardiac arrest [facility])    Rx / DC Orders ED Discharge Orders     None         Ninetta Basket, MD 08-27-2023 1927

## 2023-08-25 NOTE — ED Notes (Signed)
 400 mcg of epi given by MD

## 2023-08-27 ENCOUNTER — Inpatient Hospital Stay

## 2023-08-27 ENCOUNTER — Ambulatory Visit (HOSPITAL_COMMUNITY)

## 2023-08-28 ENCOUNTER — Ambulatory Visit: Payer: Medicare Other | Admitting: Urology

## 2023-09-02 ENCOUNTER — Inpatient Hospital Stay

## 2023-09-02 ENCOUNTER — Inpatient Hospital Stay: Admitting: Physician Assistant

## 2023-09-03 ENCOUNTER — Inpatient Hospital Stay

## 2023-09-06 DEATH — deceased

## 2023-09-22 ENCOUNTER — Ambulatory Visit: Admitting: Physician Assistant
